# Patient Record
Sex: Female | Born: 1965
Health system: Southern US, Community
[De-identification: ages and names within clinical notes are randomized; demographics above are authoritative.]

## PROBLEM LIST (undated history)

## (undated) DIAGNOSIS — M79609 Pain in unspecified limb: Secondary | ICD-10-CM

## (undated) DIAGNOSIS — R7302 Impaired glucose tolerance (oral): Secondary | ICD-10-CM

## (undated) DIAGNOSIS — M1990 Unspecified osteoarthritis, unspecified site: Secondary | ICD-10-CM

## (undated) DIAGNOSIS — R002 Palpitations: Secondary | ICD-10-CM

## (undated) DIAGNOSIS — M519 Unspecified thoracic, thoracolumbar and lumbosacral intervertebral disc disorder: Secondary | ICD-10-CM

## (undated) DIAGNOSIS — M549 Dorsalgia, unspecified: Secondary | ICD-10-CM

## (undated) DIAGNOSIS — M545 Low back pain, unspecified: Secondary | ICD-10-CM

## (undated) DIAGNOSIS — R42 Dizziness and giddiness: Secondary | ICD-10-CM

## (undated) DIAGNOSIS — M7989 Other specified soft tissue disorders: Secondary | ICD-10-CM

## (undated) DIAGNOSIS — E739 Lactose intolerance, unspecified: Secondary | ICD-10-CM

## (undated) DIAGNOSIS — R7303 Prediabetes: Secondary | ICD-10-CM

## (undated) DIAGNOSIS — K760 Fatty (change of) liver, not elsewhere classified: Secondary | ICD-10-CM

## (undated) DIAGNOSIS — E785 Hyperlipidemia, unspecified: Secondary | ICD-10-CM

## (undated) DIAGNOSIS — M109 Gout, unspecified: Secondary | ICD-10-CM

## (undated) DIAGNOSIS — E7800 Pure hypercholesterolemia, unspecified: Secondary | ICD-10-CM

## (undated) DIAGNOSIS — T7840XA Allergy, unspecified, initial encounter: Secondary | ICD-10-CM

## (undated) DIAGNOSIS — E559 Vitamin D deficiency, unspecified: Secondary | ICD-10-CM

## (undated) DIAGNOSIS — R51 Headache: Secondary | ICD-10-CM

## (undated) DIAGNOSIS — J309 Allergic rhinitis, unspecified: Secondary | ICD-10-CM

## (undated) HISTORY — DX: Palpitations: R00.2

## (undated) HISTORY — DX: Vitamin D deficiency, unspecified: E55.9

## (undated) HISTORY — DX: Low back pain, unspecified: M54.50

## (undated) HISTORY — DX: Allergic rhinitis, unspecified: J30.9

## (undated) HISTORY — DX: Morbid (severe) obesity due to excess calories: E66.01

## (undated) HISTORY — DX: Impaired glucose tolerance (oral): R73.02

## (undated) HISTORY — DX: Other specified soft tissue disorders: M79.89

## (undated) HISTORY — DX: Dorsalgia, unspecified: M54.9

## (undated) HISTORY — DX: Hyperlipidemia, unspecified: E78.5

## (undated) HISTORY — DX: Pure hypercholesterolemia, unspecified: E78.00

## (undated) HISTORY — DX: Gout, unspecified: M10.9

## (undated) HISTORY — PX: OTHER SURGICAL HISTORY: SHX169

## (undated) HISTORY — DX: Unspecified osteoarthritis, unspecified site: M19.90

## (undated) HISTORY — DX: Pain in unspecified limb: M79.609

## (undated) HISTORY — PX: TUBAL LIGATION: SHX77

## (undated) HISTORY — PX: ABDOMINAL HYSTERECTOMY: SHX81

## (undated) HISTORY — DX: Dizziness and giddiness: R42

## (undated) HISTORY — DX: Fatty (change of) liver, not elsewhere classified: K76.0

## (undated) HISTORY — DX: Unspecified thoracic, thoracolumbar and lumbosacral intervertebral disc disorder: M51.9

## (undated) HISTORY — DX: Prediabetes: R73.03

## (undated) HISTORY — DX: Lactose intolerance, unspecified: E73.9

## (undated) HISTORY — DX: Allergy, unspecified, initial encounter: T78.40XA

## (undated) HISTORY — DX: Low back pain: M54.5

## (undated) HISTORY — DX: Headache: R51

---

## 1998-08-25 ENCOUNTER — Emergency Department (HOSPITAL_COMMUNITY): Admission: EM | Admit: 1998-08-25 | Discharge: 1998-08-25 | Payer: Self-pay | Admitting: Emergency Medicine

## 1999-02-11 ENCOUNTER — Other Ambulatory Visit: Admission: RE | Admit: 1999-02-11 | Discharge: 1999-02-11 | Payer: Self-pay | Admitting: Obstetrics

## 1999-11-26 ENCOUNTER — Emergency Department (HOSPITAL_COMMUNITY): Admission: EM | Admit: 1999-11-26 | Discharge: 1999-11-26 | Payer: Self-pay | Admitting: Emergency Medicine

## 2000-01-27 ENCOUNTER — Other Ambulatory Visit: Admission: RE | Admit: 2000-01-27 | Discharge: 2000-01-27 | Payer: Self-pay | Admitting: Obstetrics

## 2000-06-28 ENCOUNTER — Emergency Department (HOSPITAL_COMMUNITY): Admission: EM | Admit: 2000-06-28 | Discharge: 2000-06-28 | Payer: Self-pay | Admitting: Emergency Medicine

## 2001-04-26 ENCOUNTER — Ambulatory Visit (HOSPITAL_COMMUNITY): Admission: RE | Admit: 2001-04-26 | Discharge: 2001-04-26 | Payer: Self-pay

## 2001-10-04 ENCOUNTER — Other Ambulatory Visit: Admission: RE | Admit: 2001-10-04 | Discharge: 2001-10-04 | Payer: Self-pay | Admitting: Obstetrics and Gynecology

## 2002-01-14 ENCOUNTER — Emergency Department (HOSPITAL_COMMUNITY): Admission: EM | Admit: 2002-01-14 | Discharge: 2002-01-14 | Payer: Self-pay | Admitting: Emergency Medicine

## 2004-08-13 ENCOUNTER — Ambulatory Visit (HOSPITAL_COMMUNITY): Admission: RE | Admit: 2004-08-13 | Discharge: 2004-08-13 | Payer: Self-pay | Admitting: Obstetrics and Gynecology

## 2004-09-01 ENCOUNTER — Encounter: Admission: RE | Admit: 2004-09-01 | Discharge: 2004-09-01 | Payer: Self-pay | Admitting: Obstetrics and Gynecology

## 2005-02-10 ENCOUNTER — Other Ambulatory Visit: Admission: RE | Admit: 2005-02-10 | Discharge: 2005-02-10 | Payer: Self-pay | Admitting: Obstetrics and Gynecology

## 2007-03-01 ENCOUNTER — Ambulatory Visit (HOSPITAL_COMMUNITY): Admission: RE | Admit: 2007-03-01 | Discharge: 2007-03-02 | Payer: Self-pay | Admitting: Obstetrics and Gynecology

## 2007-03-01 ENCOUNTER — Encounter (INDEPENDENT_AMBULATORY_CARE_PROVIDER_SITE_OTHER): Payer: Self-pay | Admitting: Obstetrics and Gynecology

## 2007-08-15 LAB — HM MAMMOGRAPHY: HM Mammogram: NORMAL

## 2008-04-02 ENCOUNTER — Encounter: Admission: RE | Admit: 2008-04-02 | Discharge: 2008-04-02 | Payer: Self-pay | Admitting: Obstetrics and Gynecology

## 2008-04-15 LAB — CONVERTED CEMR LAB: Pap Smear: NORMAL

## 2008-07-10 ENCOUNTER — Encounter: Admission: RE | Admit: 2008-07-10 | Discharge: 2008-07-10 | Payer: Self-pay | Admitting: Emergency Medicine

## 2008-11-25 ENCOUNTER — Ambulatory Visit: Payer: Self-pay | Admitting: Internal Medicine

## 2008-11-25 DIAGNOSIS — E782 Mixed hyperlipidemia: Secondary | ICD-10-CM | POA: Insufficient documentation

## 2008-11-25 DIAGNOSIS — R42 Dizziness and giddiness: Secondary | ICD-10-CM

## 2008-11-25 DIAGNOSIS — E739 Lactose intolerance, unspecified: Secondary | ICD-10-CM

## 2008-11-25 DIAGNOSIS — M109 Gout, unspecified: Secondary | ICD-10-CM

## 2008-11-25 DIAGNOSIS — J309 Allergic rhinitis, unspecified: Secondary | ICD-10-CM

## 2008-11-25 DIAGNOSIS — E785 Hyperlipidemia, unspecified: Secondary | ICD-10-CM

## 2008-11-25 DIAGNOSIS — E669 Obesity, unspecified: Secondary | ICD-10-CM | POA: Insufficient documentation

## 2008-11-25 HISTORY — DX: Hyperlipidemia, unspecified: E78.5

## 2008-11-25 HISTORY — DX: Gout, unspecified: M10.9

## 2008-11-25 HISTORY — DX: Morbid (severe) obesity due to excess calories: E66.01

## 2008-11-25 HISTORY — DX: Allergic rhinitis, unspecified: J30.9

## 2008-11-25 HISTORY — DX: Dizziness and giddiness: R42

## 2008-11-25 HISTORY — DX: Lactose intolerance, unspecified: E73.9

## 2008-11-25 LAB — CONVERTED CEMR LAB
Cholesterol: 193 mg/dL (ref 0–200)
GFR calc non Af Amer: 73.49 mL/min (ref >60)
HDL: 41.9 mg/dL (ref >39.00)
Potassium: 4.4 meq/L (ref 3.5–5.1)
Sodium: 140 meq/L (ref 135–145)
VLDL: 35.2 mg/dL (ref 0.0–40.0)

## 2009-04-30 ENCOUNTER — Ambulatory Visit (HOSPITAL_COMMUNITY): Admission: RE | Admit: 2009-04-30 | Discharge: 2009-04-30 | Payer: Self-pay | Admitting: Obstetrics and Gynecology

## 2009-06-19 ENCOUNTER — Ambulatory Visit: Payer: Self-pay | Admitting: Internal Medicine

## 2009-06-19 LAB — CONVERTED CEMR LAB
AST: 21 units/L (ref 0–37)
Alkaline Phosphatase: 57 units/L (ref 39–117)
BUN: 10 mg/dL (ref 6–23)
Bilirubin Urine: NEGATIVE
Eosinophils Absolute: 0.1 10*3/uL (ref 0.0–0.7)
GFR calc non Af Amer: 73.28 mL/min (ref >60)
HDL: 46.7 mg/dL (ref >39.00)
Hgb A1c MFr Bld: 5.6 % (ref 4.6–6.5)
MCHC: 33.6 g/dL (ref 30.0–36.0)
MCV: 93.1 fL (ref 78.0–100.0)
Monocytes Absolute: 0.5 10*3/uL (ref 0.1–1.0)
Neutrophils Relative %: 45.3 % (ref 43.0–77.0)
Nitrite: NEGATIVE
Platelets: 235 10*3/uL (ref 150.0–400.0)
Potassium: 4.3 meq/L (ref 3.5–5.1)
Sodium: 143 meq/L (ref 135–145)
Specific Gravity, Urine: 1.025 (ref 1.000–1.030)
TSH: 2.9 microintl units/mL (ref 0.35–5.50)
Total Bilirubin: 0.6 mg/dL (ref 0.3–1.2)
Total Protein, Urine: NEGATIVE mg/dL
Triglycerides: 85 mg/dL (ref 0.0–149.0)
VLDL: 17 mg/dL (ref 0.0–40.0)
pH: 5.5 (ref 5.0–8.0)

## 2009-06-25 ENCOUNTER — Ambulatory Visit: Payer: Self-pay | Admitting: Internal Medicine

## 2009-07-10 ENCOUNTER — Encounter: Payer: Self-pay | Admitting: Internal Medicine

## 2009-12-17 ENCOUNTER — Ambulatory Visit: Payer: Self-pay | Admitting: Internal Medicine

## 2009-12-17 DIAGNOSIS — M549 Dorsalgia, unspecified: Secondary | ICD-10-CM

## 2009-12-17 HISTORY — DX: Dorsalgia, unspecified: M54.9

## 2009-12-19 ENCOUNTER — Ambulatory Visit: Payer: Self-pay | Admitting: Family Medicine

## 2009-12-19 DIAGNOSIS — M79609 Pain in unspecified limb: Secondary | ICD-10-CM | POA: Insufficient documentation

## 2009-12-19 HISTORY — DX: Pain in unspecified limb: M79.609

## 2010-01-06 ENCOUNTER — Telehealth: Payer: Self-pay | Admitting: Internal Medicine

## 2010-03-19 ENCOUNTER — Encounter: Admission: RE | Admit: 2010-03-19 | Discharge: 2010-03-19 | Payer: Self-pay | Admitting: Sports Medicine

## 2010-05-07 ENCOUNTER — Ambulatory Visit (HOSPITAL_COMMUNITY)
Admission: RE | Admit: 2010-05-07 | Discharge: 2010-05-07 | Payer: Self-pay | Source: Home / Self Care | Attending: Obstetrics and Gynecology | Admitting: Obstetrics and Gynecology

## 2010-06-06 ENCOUNTER — Encounter: Payer: Self-pay | Admitting: Sports Medicine

## 2010-06-06 ENCOUNTER — Encounter: Payer: Self-pay | Admitting: Obstetrics and Gynecology

## 2010-06-15 NOTE — Progress Notes (Signed)
Summary: Referral  Phone Note Call from Patient Call back at Work Phone [phone]   Caller: Patient Summary of Call: Pt called stating that back pain has not improved with medications given at last OV. Pt is requesting referral to Ortho as discussed at OV. Initial call taken by: Margaret Pyle, CMA,  January 06, 2010 9:36 AM  Follow-up for Phone Call        done per emr Follow-up by: Corwin Levins MD,  January 06, 2010 1:03 PM  Additional Follow-up for Phone Call Additional follow up Details #1::        pt informed, will expect call from [facility] with appt info Additional Follow-up by: Margaret Pyle, CMA,  January 06, 2010 1:08 PM

## 2010-06-15 NOTE — Assessment & Plan Note (Signed)
Summary: CPX / $50 /NWS   Vital Signs:  Patient profile:   45 year old female Height:      64.5 inches Weight:      236.38 pounds BMI:     40.09 O2 Sat:      98 % on Room air Temp:     97.5 degrees F oral Pulse rate:   77 / minute BP sitting:   112 / 72  (left arm) Cuff size:   large  Vitals Entered ByZella Ball Ewing (June 25, 2009 8:41 AM)  O2 Flow:  Room air CC: Adult physical/RE   CC:  Adult physical/RE.  History of Present Illness: gained 4 lbs since lsat visit, trying to walk more;  Pt denies CP, sob, doe, wheezing, orthopnea, pnd, worsening LE edema, palps, dizziness or syncope   Pt denies new neuro symptoms such as headache, facial or extremity weakness .  Has some tinnitus and dizziness and left ear fullness in last few days, wtihout fever, headache, ST., or cough.   Problems Prior to Update: 1)  Dizziness  (ICD-780.4) 2)  Preventive Health Care  (ICD-V70.0) 3)  Morbid Obesity  (ICD-278.01) 4)  Dizziness  (ICD-780.4) 5)  Gout  (ICD-274.9) 6)  Glucose Intolerance  (ICD-271.3) 7)  Hyperlipidemia  (ICD-272.4) 8)  Allergic Rhinitis  (ICD-477.9)  Medications Prior to Update: 1)  Tramadol Hcl 50 Mg Tabs (Tramadol Hcl) .Marland Kitchen.. 1-2 By Mouth Every 6 Hours As Needed 2)  Naproxen Sodium 550 Mg Tabs (Naproxen Sodium) .Marland Kitchen.. 1 By Mouth As Needed 3)  Phentermine Hcl 37.5 Mg Tabs (Phentermine Hcl) .Marland Kitchen.. 1 By Mouth Once Daily 4)  Cetirizine Hcl 10 Mg Tabs (Cetirizine Hcl) .Marland Kitchen.. 1 By Mouth Once Daily As Needed Allergy  Current Medications (verified): 1)  Tramadol Hcl 50 Mg Tabs (Tramadol Hcl) .Marland Kitchen.. 1-2 By Mouth Every 6 Hours As Needed 2)  Naproxen Sodium 550 Mg Tabs (Naproxen Sodium) .Marland Kitchen.. 1 By Mouth As Needed 3)  Phentermine Hcl 37.5 Mg Tabs (Phentermine Hcl) .Marland Kitchen.. 1 By Mouth Once Daily 4)  Cetirizine Hcl 10 Mg Tabs (Cetirizine Hcl) .Marland Kitchen.. 1 By Mouth Once Daily As Needed Allergy  Allergies (verified): No Known Drug Allergies  Past History:  Past Medical History: Last updated:  11/25/2008 Allergic rhinitis Hyperlipidemia glucose intolerance lumbar disc disease/LLE pain , s/p cortisone shots - [place] [facility] Gout  Past Surgical History: Last updated: 11/25/2008 Tubal ligation s/p pilonidal cyst Hysterectomy 2006  - non malignant  Family History: Last updated: 11/25/2008 father with glaucoma, HTN, DM mother with elevated cholesterol, HTN grandmorther with DM grandfather with DM, CAD/MI 2 brothers - healthy  Social History: Last updated: 11/25/2008 Married 3 children 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         all otherwise negative per pt - except keloid o the mid upper sternal area - ? worse, more irritated , has seen derm and she wont do the steroid shots  Physical Exam  General:  alert and overweight-appearing.   Head:  normocephalic and atraumatic.   Eyes:  vision grossly intact, pupils equal, and pupils  round.   Ears:  R ear normal and L TM with mild erythema Nose:  no external deformity and no nasal discharge.   Mouth:  no gingival abnormalities and pharynx pink and moist.   Neck:  supple and no masses.   Lungs:  normal respiratory effort and normal breath sounds.   Heart:  normal rate and regular rhythm.   Abdomen:  soft, non-tender, and normal bowel sounds.   Msk:  no joint tenderness and no joint swelling.   Extremities:  no edema, no erythema  Neurologic:   cranial nerves II-XII intact and strength normal in all extremities.     Impression & Recommendations:  Problem # 1:  Preventive Health Care (ICD-V70.0)  Overall doing well, age appropriate education and counseling updated and referral for appropriate preventive services done unless declined, immunizations up to date or declined, diet counseling done if overweight, urged to quit smoking if smokes , most recent labs reviewed and current ordered if appropriate, ecg reviewed or declined (interpretation per ECG scanned in the EMR if done); information regarding Medicare Prevention requirements given if appropriate   Orders: EKG w/ Interpretation (93000)  Problem # 2:  DIZZINESS (ICD-780.4)  Her updated medication list for this problem includes:    Cetirizine Hcl 10 Mg Tabs (Cetirizine hcl) .Marland Kitchen... 1 by mouth once daily as needed allergy and tinnitis liekly due to left middle ear inflammation - treat as above, f/u any worsening signs or symptoms , also for mucinex otc as needed   Complete Medication List: 1)  Tramadol Hcl 50 Mg Tabs (Tramadol hcl) .Marland Kitchen.. 1-2 by mouth every 6 hours as needed 2)  Naproxen Sodium 550 Mg Tabs (Naproxen sodium) .Marland Kitchen.. 1 by mouth as needed 3)  Phentermine Hcl 37.5 Mg Tabs (Phentermine hcl) .Marland Kitchen.. 1 by mouth once daily 4)  Cetirizine Hcl 10 Mg Tabs (Cetirizine hcl) .Marland Kitchen.. 1 by mouth once daily as needed allergy  Patient Instructions: 1)  Please take all new medications as prescribed 2)  Continue all previous medications as before this visit  3)  Please schedule a follow-up appointment in 1 year or sooner if needed Prescriptions: PHENTERMINE HCL 37.5 MG TABS (PHENTERMINE HCL) 1 by mouth once daily  #30 x 2   Entered and Authorized by:   Corwin Levins MD   Signed by:   Corwin Levins MD on 06/25/2009   Method used:   Print then Give to Patient   RxID:   [id number]

## 2010-06-15 NOTE — Assessment & Plan Note (Signed)
Summary: LEFT FOOT PAIN/DLO   Vital Signs:  Patient profile:   45 year old female Height:      64.50 inches Weight:      224 pounds BMI:     37.99 O2 Sat:      98 % on Room air Temp:     98.0 degrees F oral Pulse rate:   80 / minute BP sitting:   114 / 60  (left arm)  Vitals Entered By: Doristine Devoid CMA (December 19, 2009 10:11 AM)  O2 Flow:  Room air CC: L foot pain xlast night hurts to walk on    History of Present Illness: HAs two charts.Marland Kitchen2 MR numbers. Other chart reviewed.   Has been seen  recently for lumbar disc herniation.. Dr. Jonny Ruiz..seen 2 days ago Had been having 2 weeks of left lower back pain..shooting down leg MRI last year...showed protruding disc Dx with acute left sciatica. Given oxycodone, prednisone and cyclobenzaprine..  Last night...new symptom. Sudden onset left medial foot pain. No known injury. She was alking at [employer] earlier in day but was fine.  Swelling in medial foot.  Prednisone has helped back.  No redness, no sensitivity to touch...does not feel like gout.    Review of Systems General:  Denies fatigue. CV:  Denies chest pain or discomfort. Resp:  Denies shortness of breath.  Physical Exam  General:  overweight appearing female in NAD Mouth:  MMM Msk:  left lateral ankle no tenderness to palpation, ttp over medial ankle and mid arch. no redness, minmal swelling. Limitied  ROM of ankleanf foot flex/ext due to pain.   Wearing flip flops.  Pulses:  R and L posterior tibial pulses are full and equal bilaterally  Extremities:  no edema  Neg Homan's sign...no sign of DVT.   Impression & Recommendations:  Problem # 1:  FOOT PAIN, LEFT (ICD-729.5) No clear evidence of gout or association of pain with radiculopathy. of note pt's sciatica is improving on prednisone, pain medicaitons and muscle relaxants. No sign of DVT. Musle strain likely due to walking irregularly due to sciatica. Wear more supportive shoes with arch, lace up ankle  brace to limit pulling on medial foot, ice, elevation. Follow up if not improving.  Complete Medication List: 1)  Ace Ankle Brace Left/right Misc (Elastic bandages & supports) .... Left lace up ankle brace  wear daily x 1-2 weeks.  Patient Instructions: 1)  Ice left foot/ankle. Elevate left leg. 2)   Wear lace up ankle brace. 3)   Daily range of motion exercises. 4)   Continue current back treatment. 5)  Follow up if not improving as expected.  Prescriptions: ACE ANKLE BRACE LEFT/RIGHT  MISC (ELASTIC BANDAGES & SUPPORTS) Left lace up ankle brace  Wear daily x 1-2 weeks.  #1 x 0   Entered and Authorized by:   Kerby Nora MD   Signed by:   Kerby Nora MD on 12/19/2009   Method used:   Print then Give to Patient   RxID:   [id number]

## 2010-06-15 NOTE — Assessment & Plan Note (Signed)
Summary: pain down back of legs/cd   Vital Signs:  Patient profile:   45 year old female Height:      64.5 inches Weight:      224.50 pounds BMI:     38.08 O2 Sat:      97 % on Room air Temp:     98.4 degrees F oral Pulse rate:   69 / minute BP sitting:   100 / 68  (left arm) Cuff size:   large  Vitals Entered By: Zella Ball Ewing CMA (AAMA) (December 17, 2009 1:35 PM)  O2 Flow:  Room air CC: Low back and leg pain/RE   CC:  Low back and leg pain/RE.  History of Present Illness: here with acute onset back pain for 2 wks, seemed to start after the zoomba ccalss; located lower back, rated severe 10/10 per pt;  better to lie down, but wose to sit up straight , standing up, and walkiing; does seem to radiate post buttocks and thighs to the knees; tingles in same area,  no LE weakness, no falls on injuyr; no bowel or bladder changes;  No fever, wt loss, loss of appetite or other constitutional symptoms but does have night sweats and hot flashes.  Has had 2 MRI in the past with lumbar disc herniations at 2 levels;  no surgury needed,  last MRI last year some time at [facility]; has seen orhtopedic she thinks at [facility] [place] [address] - s/p 2 cortisone shots to the back with the last one aprox 2008.   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    Problems Prior to Update: 1)  Dizziness  (ICD-780.4) 2)  Preventive Health Care  (ICD-V70.0) 3)  Morbid Obesity  (ICD-278.01) 4)  Dizziness  (ICD-780.4) 5)  Gout  (ICD-274.9) 6)  Glucose Intolerance  (ICD-271.3) 7)  Hyperlipidemia  (ICD-272.4) 8)  Allergic Rhinitis  (ICD-477.9)  Medications Prior to Update: 1)  Tramadol Hcl 50 Mg Tabs (Tramadol Hcl) .Marland Kitchen.. 1-2 By Mouth Every 6 Hours As Needed 2)  Naproxen Sodium 550 Mg Tabs (Naproxen Sodium) .Marland Kitchen.. 1 By Mouth As Needed 3)  Phentermine Hcl 37.5 Mg Tabs (Phentermine Hcl) .Marland Kitchen.. 1 By Mouth Once Daily 4)  Cetirizine Hcl 10 Mg Tabs (Cetirizine Hcl) .Marland Kitchen.. 1 By Mouth Once  Daily As Needed Allergy  Current Medications (verified): 1)  Tramadol Hcl 50 Mg Tabs (Tramadol Hcl) .Marland Kitchen.. 1-2 By Mouth Every 6 Hours As Needed 2)  Naproxen Sodium 550 Mg Tabs (Naproxen Sodium) .Marland Kitchen.. 1 By Mouth As Needed 3)  Phentermine Hcl 37.5 Mg Tabs (Phentermine Hcl) .Marland Kitchen.. 1 By Mouth Once Daily 4)  Cetirizine Hcl 10 Mg Tabs (Cetirizine Hcl) .Marland Kitchen.. 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   Head:  normocephalic and atraumatic.   Eyes:  vision grossly intact, pupils equal, and  pupils round.   Ears:  R ear normal and L ear normal.   Nose:  no external deformity and no nasal discharge.   Mouth:  no gingival abnormalities and pharynx pink and moist.   Neck:  supple and no masses.   Lungs:  normal respiratory effort and normal breath sounds.   Heart:  normal rate and regular rhythm.   Abdomen:  soft, non-tender, and normal bowel sounds.   Msk:  no spine tender, and no paravertebral swelling or rash but has marked tender to immed right lowest lumbar paravertebral area and right buttock area, no other more distal tender or swelling or rash to the legs Extremities:  no edema, no erythema  Neurologic:  cranial nerves II-XII intact, strength normal in all extremities, sensation intact to light touch, gait normal, and DTRs  symmetrical and normal.     Impression & Recommendations:  Problem # 1:  BACK PAIN (ICD-724.5)  Her updated medication list for this problem includes:    Tramadol Hcl 50 Mg Tabs (Tramadol hcl) .Marland Kitchen... 1-2 by mouth every 6 hours as needed    Naproxen Sodium 550 Mg Tabs (Naproxen sodium) .Marland Kitchen... 1 by mouth as needed    Oxycodone Hcl 5 Mg Tabs (Oxycodone hcl) .Marland Kitchen... 1-2 by mouth q 6 hrs as needed    Flexeril 5 Mg Tabs (Cyclobenzaprine hcl) .Marland Kitchen... 1po three times a day as needed exam benign exept for that documented, most likely an exac of her known lumbar DJD or DDD;  ok for pain med, pred pack trial, and trial of muscle relaxer,  do not feel films needed at this time;  pt to call if not improved in 3 to 5 days for ortho referral  and /or MRI  Complete Medication List: 1)  Tramadol Hcl 50 Mg Tabs (Tramadol hcl) .Marland Kitchen.. 1-2 by mouth every 6 hours as needed 2)  Naproxen Sodium 550 Mg Tabs (Naproxen sodium) .Marland Kitchen.. 1 by mouth as needed 3)  Cetirizine Hcl 10 Mg Tabs (Cetirizine hcl) .Marland Kitchen.. 1 by mouth once daily as needed allergy 4)  Oxycodone Hcl 5 Mg Tabs (Oxycodone hcl) .Marland Kitchen.. 1-2 by mouth q 6 hrs as needed 5)  Prednisone 10 Mg Tabs (Prednisone) .... 4po qd for 3days, then 3po qd for 3days, then 2po qd for 3days, then 1po qd for 3 days, then stop 6)  Flexeril 5 Mg Tabs (Cyclobenzaprine hcl) .Marland Kitchen.. 1po three times a day as needed  Patient Instructions: 1)  Please take all new medications as prescribed 2)  Continue all previous medications as before this visit  3)  Please schedule a follow-up appointment in 5 months with CPX labs Prescriptions: FLEXERIL 5 MG TABS (CYCLOBENZAPRINE HCL) 1po three times a day as needed  #60 x 1   Entered and Authorized by:   Corwin Levins MD   Signed by:   Corwin Levins MD on 12/17/2009   Method used:   Print then Give to Patient   RxID:   [id number] PREDNISONE 10 MG TABS (PREDNISONE) 4po qd for 3days, then 3po qd for 3days, then 2po qd for 3days, then 1po qd for 3 days,  then stop  #30 x 0   Entered and Authorized by:   Corwin Levins MD   Signed by:   Corwin Levins MD on 12/17/2009   Method used:   Print then Give to Patient   RxID:   [id number] OXYCODONE HCL 5 MG TABS (OXYCODONE HCL) 1-2 by mouth q 6 hrs as needed  #  60 x 0   Entered and Authorized by:   Corwin Levins MD   Signed by:   Corwin Levins MD on 12/17/2009   Method used:   Print then Give to Patient   RxID:   [id number]

## 2010-06-22 ENCOUNTER — Encounter: Payer: Self-pay | Admitting: Internal Medicine

## 2010-06-22 ENCOUNTER — Ambulatory Visit (INDEPENDENT_AMBULATORY_CARE_PROVIDER_SITE_OTHER): Payer: 59 | Admitting: Internal Medicine

## 2010-06-22 DIAGNOSIS — R519 Headache, unspecified: Secondary | ICD-10-CM | POA: Insufficient documentation

## 2010-06-22 DIAGNOSIS — J309 Allergic rhinitis, unspecified: Secondary | ICD-10-CM

## 2010-06-22 DIAGNOSIS — R51 Headache: Secondary | ICD-10-CM

## 2010-06-22 HISTORY — DX: Headache: R51

## 2010-07-01 NOTE — Assessment & Plan Note (Signed)
Summary: SINUS /NWS   Vital Signs:  Patient profile:   45 year old female Height:      64.5 inches (163.83 cm) O2 Sat:      98 % on Room air Temp:     98.9 degrees F (37.17 degrees C) oral Pulse rate:   79 / minute BP sitting:   102 / 78  (left arm) Cuff size:   large  Vitals Entered By: Orlan Leavens RMA (June 22, 2010 3:50 PM)  O2 Flow:  Room air CC: Sinus, URI symptoms Is Patient Diabetic? No Pain Assessment Patient in pain? no        Primary Care Provider:  Corwin Levins MD  CC:  Sinus and URI symptoms.  History of Present Illness:  URI Symptoms      This is a 45 year old woman who presents with URI symptoms.  The symptoms began 5 days ago.  The severity is described as mild.  symptoms precipitated by exposure to mold (one of her "worse" allergens).  The patient reports nasal congestion, clear nasal discharge, sore throat, dry cough, and earache, but denies purulent nasal discharge, productive cough, and sick contacts.  The patient denies fever, stiff neck, dyspnea, wheezing, rash, vomiting, diarrhea, and use of an antipyretic.  The patient also reports itchy watery eyes, itchy throat, sneezing, seasonal symptoms, and headache.  The patient denies muscle aches and severe fatigue.  The patient denies the following risk factors for Strep sinusitis: double sickening, tooth pain, Strep exposure, and tender adenopathy.    Clinical Review Panels:  Prevention   Last Mammogram:  normal (08/15/2007)   Last Pap Smear:  normal (04/15/2008)  Immunizations   Last Tetanus Booster:  Tdap (11/25/2008)  Lipid Management   Cholesterol:  200 (06/19/2009)   LDL (bad choesterol):  136 (06/19/2009)   HDL (good cholesterol):  46.70 (06/19/2009)  CBC   WBC:  5.9 (06/19/2009)   RBC:  4.18 (06/19/2009)   Hgb:  13.1 (06/19/2009)   Hct:  38.9 (06/19/2009)   Platelets:  235.0 (06/19/2009)   MCV  93.1 (06/19/2009)   MCHC  33.6 (06/19/2009)   RDW  12.2 (06/19/2009)   PMN:  45.3  (06/19/2009)   Lymphs:  44.7 (06/19/2009)   Monos:  8.0 (06/19/2009)   Eosinophils:  2.0 (06/19/2009)   Basophil:  0.0 (06/19/2009)  Complete Metabolic Panel   Glucose:  86 (06/19/2009)   Sodium:  143 (06/19/2009)   Potassium:  4.3 (06/19/2009)   Chloride:  106 (06/19/2009)   CO2:  29 (06/19/2009)   BUN:  10 (06/19/2009)   Creatinine:  0.9 (06/19/2009)   Albumin:  4.5 (06/19/2009)   Total Protein:  8.0 (06/19/2009)   Calcium:  9.6 (06/19/2009)   Total Bili:  0.6 (06/19/2009)   Alk Phos:  57 (06/19/2009)   SGPT (ALT):  24 (06/19/2009)   SGOT (AST):  21 (06/19/2009)   Current Medications (verified): 1)  Tramadol Hcl 50 Mg Tabs (Tramadol Hcl) .Marland Kitchen.. 1-2 By Mouth Every 6 Hours As Needed 2)  Naproxen Sodium 550 Mg Tabs (Naproxen Sodium) .Marland Kitchen.. 1 By Mouth As Needed 3)  Cetirizine Hcl 10 Mg Tabs (Cetirizine Hcl) .Marland Kitchen.. 1 By Mouth Once Daily As Needed Allergy 4)  Flexeril 5 Mg Tabs (Cyclobenzaprine Hcl) .Marland Kitchen.. 1po Three Times A Day As Needed  Allergies (verified): No Known Drug Allergies  Past History:  Past Medical History: Allergic rhinitis Hyperlipidemia glucose intolerance lumbar disc disease/LLE pain , s/p cortisone shots - [place] [facility] Gout  Review of Systems  The patient denies anorexia, fever, weight loss, decreased hearing, hoarseness, chest pain, dyspnea on exertion, hemoptysis, and abdominal pain.    Physical Exam  General:  overweight appearing female in NAD - nontoxic Head:  Normocephalic and atraumatic without obvious abnormalities. No apparent alopecia or balding. Eyes:  vision grossly intact; pupils equal, round and reactive to light.  conjunctiva and lids normal.    Ears:  R ear normal and L ear normal.  mild clear effusion B Mouth:  teeth and gums in good repair; mucous membranes moist, without lesions or ulcers. oropharynx clear without exudate, mild erythema. clear PND Neck:  supple, full ROM, no masses, no thyromegaly; no thyroid nodules or tenderness. no JVD or  carotid bruits.   Lungs:  normal respiratory effort, no intercostal retractions or use of accessory muscles; normal breath sounds bilaterally - no crackles and no wheezes.    Heart:  normal rate, regular rhythm, no murmur, and no rub. BLE without edema. Neurologic:  alert & oriented X3 and cranial nerves II-XII symetrically intact.  strength normal in all extremities, sensation intact to light touch, and gait normal. speech fluent without dysarthria or aphasia; follows commands with good comprehension.    Impression & Recommendations:  Problem # 1:  ALLERGIC RHINITIS (ICD-477.9)  add nasal steroid and resume daily antihist -   Her updated medication list for this problem includes:    Cetirizine Hcl 10 Mg Tabs (Cetirizine hcl) .Marland Kitchen... 1 by mouth once daily as needed allergy    Nasonex 50 Mcg/act Susp (Mometasone furoate) .Marland Kitchen... 2 sprays each nostril  every morning  Orders: Prescription Created Electronically [phone])  Problem # 2:  HEADACHE (ICD-784.0)  suspect sinus pressure related - see above also use NSAID two times a day next 5 days, then as needed  The following medications were removed from the medication list:    Oxycodone Hcl 5 Mg Tabs (Oxycodone hcl) .Marland Kitchen... 1-2 by mouth q 6 hrs as needed Her updated medication list for this problem includes:    Tramadol Hcl 50 Mg Tabs (Tramadol hcl) .Marland Kitchen... 1-2 by mouth every 6 hours as needed    Naprosyn 500 Mg Tabs (Naproxen) .Marland Kitchen... 1 by mouth two times a day x 5 days, then as needed for pain symptoms  Orders: Prescription Created Electronically [phone])  Complete Medication List: 1)  Tramadol Hcl 50 Mg Tabs (Tramadol hcl) .Marland Kitchen.. 1-2 by mouth every 6 hours as needed 2)  Naprosyn 500 Mg Tabs (Naproxen) .Marland Kitchen.. 1 by mouth two times a day x 5 days, then as needed for pain symptoms 3)  Cetirizine Hcl 10 Mg Tabs (Cetirizine hcl) .Marland Kitchen.. 1 by mouth once daily as needed allergy 4)  Flexeril 5 Mg Tabs (Cyclobenzaprine hcl) .Marland Kitchen.. 1po three times a day as  needed 5)  Nasonex 50 Mcg/act Susp (Mometasone furoate) .... 2 sprays each nostril  every morning  Patient Instructions: 1)  it was good to see you today. 2)  naprosyn pain medication for head symptoms and use nasal spray (nasonex) and zyrtec for allergy and sinus symptoms - your prescriptions have been electronically submitted to your pharmacy. Please take as directed. Contact our office if you believe you're having problems with the medication(s).  3)  Get plenty of rest, drink lots of clear liquids, and use Tylenol for comfort. Return in 7-10 days if you're not better:sooner if you're feeling worse. Prescriptions: NAPROSYN 500 MG TABS (NAPROXEN) 1 by mouth two times a day x 5 days, then as needed  for pain symptoms  #30 x 0   Entered and Authorized by:   Newt Lukes MD   Signed by:   Newt Lukes MD on 06/22/2010   Method used:   Electronically to        [facility] (retail)       [address]       [place], [place]  [address]       Ph: [phone]       Fax: [phone]   RxID:   [id number] NASONEX 50 MCG/ACT SUSP (MOMETASONE FUROATE) 2 sprays each nostril  every morning  #1 x 3   Entered and Authorized by:   Newt Lukes MD   Signed by:   Newt Lukes MD on 06/22/2010   Method used:   Electronically to        [facility] (retail)       [address]       [place], [place]  [address]       Ph: [phone]       Fax: [phone]   RxID:   [id number]    Orders Added: 1)  Est. Patient Level IV [30160] 2)  Prescription Created Electronically [phone]

## 2010-07-26 ENCOUNTER — Other Ambulatory Visit: Payer: Self-pay | Admitting: Sports Medicine

## 2010-07-26 DIAGNOSIS — M549 Dorsalgia, unspecified: Secondary | ICD-10-CM

## 2010-07-30 ENCOUNTER — Other Ambulatory Visit: Payer: 59

## 2010-08-24 ENCOUNTER — Encounter: Payer: Self-pay | Admitting: Internal Medicine

## 2010-08-24 ENCOUNTER — Ambulatory Visit (INDEPENDENT_AMBULATORY_CARE_PROVIDER_SITE_OTHER): Payer: 59 | Admitting: Internal Medicine

## 2010-08-24 DIAGNOSIS — R7302 Impaired glucose tolerance (oral): Secondary | ICD-10-CM

## 2010-08-24 DIAGNOSIS — Z0000 Encounter for general adult medical examination without abnormal findings: Secondary | ICD-10-CM

## 2010-08-24 DIAGNOSIS — R062 Wheezing: Secondary | ICD-10-CM

## 2010-08-24 DIAGNOSIS — J209 Acute bronchitis, unspecified: Secondary | ICD-10-CM

## 2010-08-24 DIAGNOSIS — R7309 Other abnormal glucose: Secondary | ICD-10-CM

## 2010-08-24 DIAGNOSIS — J309 Allergic rhinitis, unspecified: Secondary | ICD-10-CM

## 2010-08-24 HISTORY — DX: Impaired glucose tolerance (oral): R73.02

## 2010-08-24 MED ORDER — PREDNISONE 10 MG PO TABS: 10.0000 mg | ORAL_TABLET | Freq: Every day | ORAL | Status: DC

## 2010-08-24 MED ORDER — METHYLPREDNISOLONE ACETATE 80 MG/ML IJ SUSP
120.0000 mg | Freq: Once | INTRAMUSCULAR | Status: AC
  Administered 2010-08-24: 120 mg via INTRAMUSCULAR

## 2010-08-24 MED ORDER — MOMETASONE FUROATE 50 MCG/ACT NA SUSP: 2.0000 | Freq: Every day | NASAL | Status: DC

## 2010-08-24 MED ORDER — FEXOFENADINE HCL 180 MG PO TABS: 180.0000 mg | ORAL_TABLET | Freq: Every day | ORAL | Status: DC

## 2010-08-24 MED ORDER — AZITHROMYCIN 250 MG PO TABS: ORAL_TABLET | ORAL | Status: AC

## 2010-08-24 MED ORDER — BENZONATATE 100 MG PO CAPS: 100.0000 mg | ORAL_CAPSULE | Freq: Two times a day (BID) | ORAL | Status: DC | PRN

## 2010-08-24 NOTE — Assessment & Plan Note (Signed)
Mild to mod, ? Worsening - for depomedrol IM today, and prepack for home, and cough med

## 2010-08-24 NOTE — Assessment & Plan Note (Signed)
Also add the allegra for better control

## 2010-08-24 NOTE — Progress Notes (Signed)
Subjective:    Patient ID: Lauren Fox, female    DOB: Jul 15, 1965, 45 y.o.   MRN: [id number]  HPI  Here for acute onset 3 days fever, general malaise and weakness, prod cough with greenish sputum and mild wheezing/sob/doe;  Pt denies chest pain,  orthopnea, PND, increased LE swelling, palpitations, dizziness or syncope.  This is in addition to ongoing mild but worsening nasal allergy symptoms also increased over the past 4 wks, with clearish d/c, sneeze, itch, mild ST and nonprod cough, worse at night.  Pt denies new neurological symptoms such as new headache, or facial or extremity weakness or numbness.   Pt denies polydipsia, polyuria.  Overall good compliance with treatment, and good medicine tolerability.  Pt denies fever, wt loss, night sweats, loss of appetite, or other constitutional symptoms except with recent acute symptoms.    Past Medical History  Diagnosis Date  . GLUCOSE INTOLERANCE 11/25/2008  . HYPERLIPIDEMIA 11/25/2008  . Gout, unspecified 11/25/2008  . Morbid obesity 11/25/2008  . ALLERGIC RHINITIS 11/25/2008  . Unspecified backache 12/17/2009  . FOOT PAIN, LEFT 12/19/2009  . DIZZINESS 11/25/2008  . Headache 06/22/2010  . Impaired glucose tolerance 08/24/2010   Past Surgical History  Procedure Date  . Tubal ligation   . Abdominal hysterectomy 20063    non malignant  . S/p pilonidal cyst     reports that she has never smoked. She does not have any smokeless tobacco history on file. She reports that she drinks alcohol. She reports that she uses illicit drugs (Benzodiazepines). family history includes Coronary artery disease in her other; Diabetes in her father and others; Glaucoma in her father; Heart attack in her other; Hyperlipidemia in her mother; Hypertension in her father and mother; and Miscarriages / [place] in her brother. No Known Allergies Current Outpatient Prescriptions on File Prior to Visit  Medication Sig Dispense Refill  . DISCONTD: mometasone (NASONEX) 50  MCG/ACT nasal spray 2 sprays by Nasal route daily.        . cyclobenzaprine (FLEXERIL) 5 MG tablet Take 5 mg by mouth 3 (three) times daily as needed.        . naproxen (NAPROSYN) 500 MG tablet Take 500 mg by mouth. 1 by mouth two times a day for 5 days, then as needed for pain symptoms       . traMADol (ULTRAM) 50 MG tablet Take 50 mg by mouth. 1-2 by mouth every 6 hours as needed       . DISCONTD: cetirizine (ZYRTEC) 10 MG tablet Take 10 mg by mouth daily.         No current facility-administered medications on file prior to visit.   Review of Systems Review of Systems  Constitutional: Negative for diaphoresis and unexpected weight change.  HENT: Negative for drooling and tinnitus.   Eyes: Negative for photophobia and visual disturbance.  Respiratory: Negative for choking and stridor.   Gastrointestinal: Negative for vomiting and blood in stool.  Genitourinary: Negative for hematuria and decreased urine volume.  Musculoskeletal: Negative for gait problem.  Skin: Negative for color change and wound.  Neurological: Negative for tremors and numbness.  Psychiatric/Behavioral: Negative for decreased concentration. The patient is not hyperactive.       Objective:   Physical ExamBP 110/72  Pulse 73  Temp(Src) 98.4 F (36.9 C) (Oral)  Ht 5\' 4"  (1.626 m)  Wt 234 lb 8 oz (106.369 kg)  BMI 40.25 kg/m2  SpO2 99% Physical Exam  VS noted Constitutional: Pt appears well-developed and  well-nourished.  HENT: Head: Normocephalic.  Right Ear: External ear normal. bilat tm's mild erythema, sinus nontender Left Ear: External ear normal.  pharynx with cobblestoning appearance, Eyes: Conjunctivae and EOM are normal. Pupils are equal, round, and reactive to light.  Neck: Normal range of motion. Neck supple.  No LA Cardiovascular: Normal rate and regular rhythm.   Pulmonary/Chest: Effort normal and breath sounds normal.  Abd:  Soft, NT, non-distended, + BS Neurological: Pt is alert. No cranial  nerve deficit.  Skin: Skin is warm. No erythema.  Psychiatric: Pt behavior is normal. Thought content normal.          Assessment & Plan:

## 2010-08-24 NOTE — Patient Instructions (Addendum)
You had the steroid shot today Take all new medications as prescribed Continue all other medications as before Please return in 6 mo with Lab testing done 3-5 days before (the office will you)

## 2010-09-28 NOTE — Op Note (Signed)
Lauren Fox, Lauren Fox             ACCOUNT NO.:  [id number]   MEDICAL RECORD NO.:  [id number]          PATIENT TYPE:  AMB   LOCATION:  SDC                           FACILITY:  [facility]   PHYSICIAN:  Hal Morales, M.D.DATE OF BIRTH:  Jul 15, 1965   DATE OF PROCEDURE:  03/01/2007  DATE OF DISCHARGE:                               OPERATIVE REPORT   .   PREOPERATIVE DIAGNOSIS:  Irregular bleeding, pelvic pain and  dysmenorrhea.   POSTOPERATIVE DIAGNOSES:  Irregular bleeding, pelvic pain and  dysmenorrhea.   OPERATION:  Total vaginal hysterectomy and placement of vaginal packing.   SURGEON:  Hal Morales, M.D.   FIRST ASSISTANT:  Elmira J. Adline Peals.   ANESTHESIA:  General orotracheal.   ESTIMATED BLOOD LOSS:  125 mL.   COMPLICATIONS:  None.   FINDINGS:  Uterus was upper limits of normal size.  The ovaries were  normal bilaterally except for a left follicular cyst on the left ovary  and this ruptured on contact.  Uterus weighed 95 grams.   PROCEDURE:  The patient was taken to the operating room after  appropriate identification and placed on the operating table.  After the  attainment of adequate general anesthesia, she was placed in the  lithotomy position.  The perineum and vagina were prepped with multiple  layers of Betadine and draped as a sterile field.  A Foley catheter was  inserted into the bladder and connected to straight drainage.  A  weighted speculum was placed in the posterior vagina.  Lahey tenaculum  placed on the cervix and the cervicovaginal mucosa injected with a total  of 30 mL of dilute Pitressin.  The cervix was circumscribed and the  anterior vaginal mucosa dissected off the anterior cervix.  The  posterior peritoneum was entered and the uterosacral ligaments on the  right and left side clamped, cut, suture ligated and those sutures held.  The paracervical tissues were then clamped, cut and suture ligated.  The  anterior peritoneum was entered  and the bladder blade placed.  The  parametrial tissues were clamped, cut and suture ligated.  The uterus  was inverted and the upper pedicles clamped, cut, tied with a free tie  and suture ligated.  A McCall culdoplasty suture was then placed in the  posterior vagina incorporating the uterosacral ligaments on either side  and the intervening posterior peritoneum.  The vaginal angle sutures  were created with the sutures that had been held from the uterosacral  ligaments by placing them anterior-posterior on the vaginal cuff and  tying down.  The remainder of the vaginal cuff was then closed in a  single layer incorporating anterior vaginal mucosa, anterior peritoneum,  posterior peritoneum, and posterior vaginal mucosa in figure-of-eight  sutures.  All sutures were 0 Vicryl.  At this time hemostasis was noted  to be adequate and the [facility] culdoplasty suture was tied down.  The  vagina was packed with vaginal packing and the patient awakened from  general anesthesia and taken to the recovery room in satisfactory  condition having tolerated procedure well with sponge and instrument  counts  correct.   SPECIMENS TO PATHOLOGY:  Uterus and cervix.      Hal Morales, M.D.  Electronically Signed     VPH/MEDQ  D:  03/01/2007  T:  03/02/2007  Job:  [id number]

## 2010-09-28 NOTE — H&P (Signed)
NAMEANGELAMARIE, AVAKIAN             ACCOUNT NO.:  [id number]   MEDICAL RECORD NO.:  [id number]          PATIENT TYPE:  AMB   LOCATION:  SDC                           [facility]ILITY:  [facility]   PHYSICIAN:  Hal Morales, M.D.DATE OF BIRTH:  January 30, 1966   DATE OF ADMISSION:  03/01/2007  DATE OF DISCHARGE:                              HISTORY & PHYSICAL   HISTORY OF PRESENT ILLNESS:  Ms. Moise is a 45 year old, married,  African-American female, para 3-0-0-3 who presents for a total vaginal  hysterectomy because of irregular vaginal bleeding, dysmenorrhea and  pelvic pain.  Ms. Sek has a long history of pelvic pain and  irregular bleeding which was not relieved with nonsteroidal anti-  inflammatory medications and the patient declined oral contraceptives.  In December 2002, the patient underwent a diagnostic laparoscopy,  however there was no evidence of adhesions or endometriosis found.  Subsequently the patient was given a 10-month course of Lupron Depot with  good symptom relief; however, severe vasomotor symptoms required medical  intervention. The patient remained asymptomatic until 2006 when she  again complained of dyspareunia, severe dysmenorrhea and irregular  vaginal bleeding.  She would rate  her pain at a 10/10 on a 10-point  pain scale and decreasing only to 8/10 with nonsteroidal anti-  inflammatory medications.  As for her bleeding, it occurred every 2-4  weeks with the need to change her pad 2-4 times daily. She denied any  urinary tract symptoms, vaginitis symptoms, fever or changes in her  bowel movements.  Again, the patient declined oral contraceptives for  her treatment but opted to begin Provera 40 mg daily to achieve  amenorrhea.  The patient found that without bleeding she also was  without pain.  This therapy proved successful for her both irregular  bleeding and pelvic pain even when it was tapered to 10 mg per day.  Eventually the patient's weight began to  increase on Provera and she  sought other management options.  Consideration was given to endometrial  ablation, Mirena IUD, remaining on Provera high dose and hysterectomy.  Since the patient's pain was directly associated with her bleeding, she  decided to proceed with definitive therapy in the form of hysterectomy.   PAST MEDICAL HISTORY:   OB HISTORY:  Gravida 4, para 3-0-1-3.   GYN HISTORY:  Menarche 45 years old. Last menstrual period May 2006 due  to being on double Provera. Contraception bilateral tubal ligation.  The  patient does have a history of human papillomavirus. She has a history  of an abnormal Pap smear in 1988 which was treated with cryotherapy.  Her last normal Pap smear was December 2007.   MEDICAL HISTORY:  Negative.   SURGICAL HISTORY:  1994 bilateral tubal ligation, 1996 pilonidal cyst  excision, 2002 diagnostic laparoscopy.  She denies any problems with  anesthesia or history of blood transfusions.   FAMILY HISTORY:  Peptic ulcer disease, hypertension, diabetes mellitus,  migraines thyroid disease, and asthma.   SOCIAL HISTORY:  The patient is married and she works for [employer].   HABITS:  She does not use tobacco  or illicit drugs.  She consumes  alcohol on occasion.   CURRENT MEDICATIONS:  Provera 10 mg. She has no known drug allergies.   REVIEW OF SYSTEMS:  The patient occasionally has sinusitis; however, she  denies any chest pain, shortness of breath, headache, vision changes,  acid reflux and except as is mentioned in the history of present illness  the patient's review of systems is negative.   PHYSICAL EXAM:  Vital Signs:  Blood pressure 118/72, pulse is 80.  Height is 5 feet 4 inches tall.  Weight is 234 pounds.  NECK:  Supple without masses.  There is no thyromegaly or cervical  adenopathy.  HEART:  Regular rate and rhythm.  There is no murmur.  LUNGS:  Clear to auscultation.  There are no wheezes, rales or rhonchi.  BACK:  No  CVA tenderness.  ABDOMEN:  No tenderness, masses or organomegaly.  EXTREMITIES:  No clubbing, cyanosis or edema.  PELVIC:  EGBUS is within normal limits.  Vagina is rugous. Cervix is  nontender without lesions.  Uterus is upper limits of normal size  without tenderness.  Adnexa without tenderness or masses.  Rectovaginal  without tenderness or masses.   IMPRESSION:  1. Irregular bleeding.  2. Pelvic pain.  3. Dysmenorrhea.   DISPOSITION:  A discussion was held with the patient regarding the  indications for her procedure along with its risks which include but are  not limited to reaction to anesthesia, damage to adjacent organs,  infection, excessive bleeding and the possibility that an abdominal  incision may be needed to complete her surgery safely. The patient  verbalized understanding of these risk and has consented to proceed with  a total vaginal hysterectomy with a possibility of a total abdominal  hysterectomy at [facility] [place] on March 01, 2007 at  9:00 a.m.      Elmira J. Adline Peals.      Hal Morales, M.D.  Electronically Signed    EJP/MEDQ  D:  02/25/2007  T:  02/26/2007  Job:  [id number]

## 2010-10-01 NOTE — Discharge Summary (Signed)
NAMECELISE, BAZAR             ACCOUNT NO.:  [id number]   MEDICAL RECORD NO.:  [id number]          PATIENT TYPE:  OIB   LOCATION:  9311                          [facility]ACILITY:  [facility]   PHYSICIAN:  Hal Morales, M.D.DATE OF BIRTH:  Jan 07, 1966   DATE OF ADMISSION:  03/01/2007  DATE OF DISCHARGE:  03/02/2007                               DISCHARGE SUMMARY   DISCHARGE DIAGNOSIS:  1. Irregular bleeding.  2. Pelvic pain.  3. Dysmenorrhea.   OPERATION:  On the day of admission the patient underwent a total  vaginal hysterectomy tolerating procedure well.  The patient was found  to have a uterus which was upper limits of normal size with normal-  appearing ovaries bilaterally except for a left follicular cyst on the  left ovary which ruptured on contact.  The patient's uterus weighed 95  grams.   HISTORY OF PRESENT ILLNESS:  Ms. Longo is a 45 year old married  African American female para 3-0-0-3 who presents for hysterectomy  because of irregular bleeding, dysmenorrhea, and pelvic pain.  Please  see the patient's dictated history and physical examination for details.   Preoperative physical exam blood pressure 118/72, pulse 80, height 5  feet 4 inches tall.  Weight 234 pounds.  General exam was within normal  limits.  Pelvic exam EGD was within normal limits.  Vagina was rugose.  Cervix was nontender without lesions.  Uterus was upper limits of normal  size without tenderness.  Adnexa was without tenderness or mass masses  and rectovaginal was without tenderness or masses.   HOSPITAL COURSE:  On the day of admission the patient underwent  aforementioned procedure, tolerating it well.  Postoperative course was  unremarkable with patient resuming bowel and bladder function by postop  day #1; and, therefore, deemed ready for discharge home.  The patient's  postop hemoglobin was 12.5 (preop hemoglobin 14.0).   DISCHARGE MEDICATIONS:  1. Colace 100 mg twice daily until bowel  movements are regular.  2. Ibuprofen 600 mg with food q.6 h. for five days then as needed for      pain.  3. Percocet 5/325 one to two tablets q.4 h. as needed for pain.  4. Phenergan 25 mg q.6 h. as needed for nausea.   FOLLOWUP:  The patient is scheduled for a 6 weeks postoperative visit  with Dr. Pennie Rushing on April 16, 2007 at 10:30 a.m.   DISCHARGE INSTRUCTIONS:  The patient was given a copy of [facility] postoperative instruction sheet.  She was further  advised to avoid driving until she is no longer consuming narcotics no  heavy  lifting for 4 weeks, no intercourse for 6 weeks, that she may shower and  bathe, that she may walk up steps, that she should increase her activity  slowly.  The patient's diet was without restriction, and final pathology  was not available at the time of this discharge summary.      Elmira J. Adline Peals.      Hal Morales, M.D.  Electronically Signed    EJP/MEDQ  D:  04/03/2007  T:  04/04/2007  Job:  [id number] 

## 2010-10-01 NOTE — Op Note (Signed)
[facility]  Patient:    Lauren Fox, Lauren Fox Visit Number: [id number] MRN: [id number]          Service Type: DSU Location: [facility] Attending Physician:  Shaune Spittle Dictated by:   Maris Berger. Pennie Rushing, M.D. Proc. Date: 04/26/01 Admit Date:  04/26/2001 Discharge Date: 04/26/2001                             Operative Report  PREOPERATIVE DIAGNOSIS:       Pelvic pain and dyspareunia.  POSTOPERATIVE DIAGNOSIS:      Pelvic pain and dyspareunia.  OPERATION:                    Diagnostic laparoscopy.  SURGEON:                      Vanessa P. Pennie Rushing, M.D.  ANESTHESIA:                   General orotracheal.  ESTIMATED BLOOD LOSS:         Less than 10 cc.  COMPLICATIONS:                None.  FINDINGS:                     The uterus and ovaries were within normal limits.  The tubes were status post interruption for tubal sterilization.  The pelvic peritoneal surfaces were without evidence of adhesions or endometriosis.  DESCRIPTION OF PROCEDURE:     The patient was taken to the operating room after appropriate identification and placed on the operating table.  After the achievement of adequate general anesthesia, she was placed in the modified lithotomy position.  The abdomen, perineum, and vagina were prepped with multiple layers of Betadine.  A Foley catheter was inserted into the bladder and connected to straight drainage.  A single tooth tenaculum was placed on the cervix.  The abdomen was draped as a sterile field.  A subumbilical and suprapubic injection of 0.25% Marcaine was undertaken.  The subumbilical incision was made and a Veress cannula placed through that incision into the peritoneal cavity.  A pneumoperitoneum was created with 3 L of CO2.  The Veress cannula was removed and a laparoscopic trocar placed through the subumbilical incision.  The laparoscope was placed through the trocar sleeve. A suprapubic incision was made to  the left of the midline, and a laparoscopic probe trocar placed through that incision into the peritoneal cavity under direct visualization.  The above noted findings were made and documented, and no specific pelvic pathology noted.  All instruments were then removed from the peritoneal cavity under direct visualization as the CO2 was allowed to escape.  The suprapubic and subumbilical incisions were then closed with subcuticular sutures of 3-0 Vicryl.  The single tooth tenaculum and Foley catheter were removed.  The patient was awakened from general anesthesia and taken to the recovery room in satisfactory condition having tolerated the procedure well with sponge and instrument count correct. Dictated by:   Maris Berger. Pennie Rushing, M.D. Attending Physician:  Shaune Spittle DD:  04/26/01 TD:  04/26/01 Job: [id number]

## 2011-02-17 ENCOUNTER — Telehealth: Payer: Self-pay

## 2011-02-17 ENCOUNTER — Other Ambulatory Visit (INDEPENDENT_AMBULATORY_CARE_PROVIDER_SITE_OTHER): Payer: 59

## 2011-02-17 DIAGNOSIS — R5383 Other fatigue: Secondary | ICD-10-CM

## 2011-02-17 DIAGNOSIS — Z0000 Encounter for general adult medical examination without abnormal findings: Secondary | ICD-10-CM

## 2011-02-17 DIAGNOSIS — R7302 Impaired glucose tolerance (oral): Secondary | ICD-10-CM

## 2011-02-17 DIAGNOSIS — R5381 Other malaise: Secondary | ICD-10-CM

## 2011-02-17 DIAGNOSIS — R7309 Other abnormal glucose: Secondary | ICD-10-CM

## 2011-02-17 LAB — URINALYSIS, ROUTINE W REFLEX MICROSCOPIC
Leukocytes, UA: NEGATIVE
Nitrite: NEGATIVE
Specific Gravity, Urine: >=1.03 (ref 1.000–1.030)
Total Protein, Urine: NEGATIVE
pH: 6 (ref 5.0–8.0)

## 2011-02-17 LAB — HEPATIC FUNCTION PANEL
AST: 21 U/L (ref 0–37)
Albumin: 4.3 g/dL (ref 3.5–5.2)
Alkaline Phosphatase: 58 U/L (ref 39–117)
Bilirubin, Direct: 0 mg/dL (ref 0.0–0.3)
Total Protein: 7.6 g/dL (ref 6.0–8.3)

## 2011-02-17 LAB — CBC WITH DIFFERENTIAL/PLATELET
Basophils Absolute: 0 10*3/uL (ref 0.0–0.1)
Eosinophils Absolute: 0.1 10*3/uL (ref 0.0–0.7)
Lymphocytes Relative: 43.8 % (ref 12.0–46.0)
Lymphs Abs: 2.6 10*3/uL (ref 0.7–4.0)
MCHC: 34.2 g/dL (ref 30.0–36.0)
Monocytes Relative: 6.7 % (ref 3.0–12.0)
Platelets: 228 10*3/uL (ref 150.0–400.0)
RDW: 13.2 % (ref 11.5–14.6)

## 2011-02-17 LAB — BASIC METABOLIC PANEL
CO2: 25 mEq/L (ref 19–32)
Chloride: 108 mEq/L (ref 96–112)
Glucose, Bld: 92 mg/dL (ref 70–99)
Potassium: 4.1 mEq/L (ref 3.5–5.1)
Sodium: 141 mEq/L (ref 135–145)

## 2011-02-17 LAB — TSH: TSH: 2.67 u[IU]/mL (ref 0.35–5.50)

## 2011-02-17 NOTE — Telephone Encounter (Signed)
Put order in for lab. 

## 2011-02-18 LAB — HEMOGLOBIN A1C: Hgb A1c MFr Bld: 5.8 % (ref 4.6–6.5)

## 2011-02-23 ENCOUNTER — Ambulatory Visit (INDEPENDENT_AMBULATORY_CARE_PROVIDER_SITE_OTHER): Payer: 59 | Admitting: Internal Medicine

## 2011-02-23 ENCOUNTER — Encounter: Payer: Self-pay | Admitting: Internal Medicine

## 2011-02-23 VITALS — BP 120/80 | HR 75 | Temp 99.3°F | Ht 64.5 in | Wt 235.8 lb

## 2011-02-23 DIAGNOSIS — Z23 Encounter for immunization: Secondary | ICD-10-CM

## 2011-02-23 DIAGNOSIS — R7302 Impaired glucose tolerance (oral): Secondary | ICD-10-CM

## 2011-02-23 DIAGNOSIS — E559 Vitamin D deficiency, unspecified: Secondary | ICD-10-CM

## 2011-02-23 DIAGNOSIS — Z0000 Encounter for general adult medical examination without abnormal findings: Secondary | ICD-10-CM

## 2011-02-23 DIAGNOSIS — R7309 Other abnormal glucose: Secondary | ICD-10-CM

## 2011-02-23 LAB — CBC
MCV: 91.6
RBC: 3.91
WBC: 11.5 — ABNORMAL HIGH

## 2011-02-23 MED ORDER — PHENTERMINE HCL 37.5 MG PO CAPS: 37.5000 mg | ORAL_CAPSULE | ORAL | Status: DC

## 2011-02-23 NOTE — Assessment & Plan Note (Addendum)
Overall doing well, age appropriate education and counseling updated, referrals for preventative services and immunizations addressed, dietary and smoking counseling addressed, most recent labs and ECG reviewed.  I have personally reviewed and have noted: 1) the patient's medical and social history 2) The pt's use of alcohol, tobacco, and illicit drugs 3) The patient's current medications and supplements 4) Functional ability including ADL's, fall risk, home safety risk, hearing and visual impairment 5) Diet and physical activities 6) Evidence for depression or mood disorder 7) The patient's height, weight, and BMI have been recorded in the chart I have made referrals, and provided counseling and education based on review of the above OK for limited phentermine for wt loss today

## 2011-02-23 NOTE — Progress Notes (Signed)
Addended by: Scharlene Gloss B on: 02/23/2011 11:40 AM   Modules accepted: Orders

## 2011-02-23 NOTE — Progress Notes (Signed)
Subjective:    Patient ID: Lauren Fox, female    DOB: 25-Apr-1966, 45 y.o.   MRN: [id number]  HPI  Here for wellness and f/u;  Overall doing ok;  Pt denies CP, worsening SOB, DOE, wheezing, orthopnea, PND, worsening LE edema, palpitations, dizziness or syncope.  Pt denies neurological change such as new Headache, facial or extremity weakness.  Pt denies polydipsia, polyuria, or low sugar symptoms. Pt states overall good compliance with treatment and medications, good tolerability, and trying to follow lower cholesterol diet.  Pt denies worsening depressive symptoms, suicidal ideation or panic. No fever, wt loss, night sweats, loss of appetite, or other constitutional symptoms.  Pt states good ability with ADL's, low fall risk, home safety reviewed and adequate, no significant changes in hearing or vision, and occasionally active with exercise.  Has been trying to get more exercise recently, but seems to always start and stop.  Work from home.,l does not think she is overeating. Past Medical History  Diagnosis Date  . GLUCOSE INTOLERANCE 11/25/2008  . HYPERLIPIDEMIA 11/25/2008  . Gout, unspecified 11/25/2008  . Morbid obesity 11/25/2008  . ALLERGIC RHINITIS 11/25/2008  . Unspecified backache 12/17/2009  . FOOT PAIN, LEFT 12/19/2009  . DIZZINESS 11/25/2008  . Headache 06/22/2010  . Impaired glucose tolerance 08/24/2010   Past Surgical History  Procedure Date  . Tubal ligation   . Abdominal hysterectomy 20063    non malignant  . S/p pilonidal cyst     reports that she has never smoked. She does not have any smokeless tobacco history on file. She reports that she drinks alcohol. She reports that she uses illicit drugs (Benzodiazepines). family history includes Coronary artery disease in her other; Diabetes in her father and others; Glaucoma in her father; Heart attack in her other; Hyperlipidemia in her mother; Hypertension in her father and mother; and Miscarriages / [place] in her brother. No  Known Allergies Current Outpatient Prescriptions on File Prior to Visit  Medication Sig Dispense Refill  . benzonatate (TESSALON PERLES) 100 MG capsule Take 1 capsule (100 mg total) by mouth 2 (two) times daily as needed for cough.  60 capsule  1  . fexofenadine (ALLEGRA) 180 MG tablet Take 1 tablet (180 mg total) by mouth daily.  30 tablet  2  . mometasone (NASONEX) 50 MCG/ACT nasal spray 2 sprays by Nasal route daily.  17 g  2  . naproxen (NAPROSYN) 500 MG tablet Take 500 mg by mouth. 1 by mouth two times a day for 5 days, then as needed for pain symptoms        Review of Systems Review of Systems  Constitutional: Negative for diaphoresis, activity change, appetite change and unexpected weight change.  HENT: Negative for hearing loss, ear pain, facial swelling, mouth sores and neck stiffness.   Eyes: Negative for pain, redness and visual disturbance.  Respiratory: Negative for shortness of breath and wheezing.   Cardiovascular: Negative for chest pain and palpitations.  Gastrointestinal: Negative for diarrhea, blood in stool, abdominal distention and rectal pain.  Genitourinary: Negative for hematuria, flank pain and decreased urine volume.  Musculoskeletal: Negative for myalgias and joint swelling.  Skin: Negative for color change and wound.  Neurological: Negative for syncope and numbness.  Hematological: Negative for adenopathy.  Psychiatric/Behavioral: Negative for hallucinations, self-injury, decreased concentration and agitation.      Objective:   Physical Exam BP 120/80  Pulse 75  Temp(Src) 99.3 F (37.4 C) (Oral)  Ht 5' 4.5" (1.638 m)  Wt 235  lb 12 oz (106.935 kg)  BMI 39.84 kg/m2  SpO2 99% Physical Exam  VS noted Constitutional: Pt is oriented to person, place, and time. Appears well-developed and well-nourished.  Head: Normocephalic and atraumatic.  Right Ear: External ear normal.  Left Ear: External ear normal.  Nose: Nose normal.  Mouth/Throat: Oropharynx is  clear and moist.  Eyes: Conjunctivae and EOM are normal. Pupils are equal, round, and reactive to light.  Neck: Normal range of motion. Neck supple. No JVD present. No tracheal deviation present.  Cardiovascular: Normal rate, regular rhythm, normal heart sounds and intact distal pulses.   Pulmonary/Chest: Effort normal and breath sounds normal.  Abdominal: Soft. Bowel sounds are normal. There is no tenderness.  Musculoskeletal: Normal range of motion. Exhibits no edema.  Lymphadenopathy:  Has no cervical adenopathy.  Neurological: Pt is alert and oriented to person, place, and time. Pt has normal reflexes. No cranial nerve deficit.  Skin: Skin is warm and dry. No rash noted.  Psychiatric:  Has  normal mood and affect. Behavior is normal.     Assessment & Plan:

## 2011-02-23 NOTE — Patient Instructions (Addendum)
Take all new medications as prescribed - for wt loss Continue all other medications as before You had the flu shot today Your lab work was faxed to Dr Jeneen Rinks [facility] Please return in 1 year for your yearly visit, or sooner if needed, with Lab testing done 3-5 days before

## 2011-02-24 LAB — CBC
HCT: 40.7
Hemoglobin: 14
MCV: 90.6
RBC: 4.49
WBC: 5.3

## 2011-03-30 ENCOUNTER — Encounter: Payer: Self-pay | Admitting: Internal Medicine

## 2011-03-30 ENCOUNTER — Ambulatory Visit (INDEPENDENT_AMBULATORY_CARE_PROVIDER_SITE_OTHER): Payer: 59 | Admitting: Internal Medicine

## 2011-03-30 VITALS — BP 108/80 | HR 76 | Temp 98.4°F | Ht 64.5 in | Wt 230.2 lb

## 2011-03-30 DIAGNOSIS — J019 Acute sinusitis, unspecified: Secondary | ICD-10-CM

## 2011-03-30 DIAGNOSIS — J309 Allergic rhinitis, unspecified: Secondary | ICD-10-CM

## 2011-03-30 DIAGNOSIS — R7309 Other abnormal glucose: Secondary | ICD-10-CM

## 2011-03-30 DIAGNOSIS — R7302 Impaired glucose tolerance (oral): Secondary | ICD-10-CM

## 2011-03-30 MED ORDER — HYDROCODONE-HOMATROPINE 5-1.5 MG/5ML PO SYRP: 5.0000 mL | ORAL_SOLUTION | Freq: Four times a day (QID) | ORAL | Status: AC | PRN

## 2011-03-30 MED ORDER — LEVOFLOXACIN 500 MG PO TABS: 500.0000 mg | ORAL_TABLET | Freq: Every day | ORAL | Status: AC

## 2011-03-30 NOTE — Assessment & Plan Note (Signed)
Mild to mod, for antibx course,  to f/u any worsening symptoms or concerns 

## 2011-03-30 NOTE — Patient Instructions (Addendum)
Take all new medications as prescribed - the antibiotic and cough med (the cough med is given in hardcopy) Continue all other medications as before You are given the work note for being off work wed and thur this wk

## 2011-04-03 ENCOUNTER — Encounter: Payer: Self-pay | Admitting: Internal Medicine

## 2011-04-03 NOTE — Progress Notes (Signed)
  Subjective:    Patient ID: Lauren Fox, female    DOB: 1965-08-21, 45 y.o.   MRN: [id number]  HPI   Here with 3 days acute onset fever, facial pain, pressure, general weakness and malaise, and greenish d/c, with slight ST, but little to no cough and Pt denies chest pain, increased sob or doe, wheezing, orthopnea, PND, increased LE swelling, palpitations, dizziness or syncope  Also - Does have several wks ongoing nasal allergy symptoms with clear congestion, itch and sneeze, without fever, pain, ST, cough or wheezing.   Pt denies polydipsia, polyuria  Pt states overall good compliance with meds, trying to follow lower cholesterol, diabetic diet, wt overall stable but little exercise however.    Past Medical History  Diagnosis Date  . GLUCOSE INTOLERANCE 11/25/2008  . HYPERLIPIDEMIA 11/25/2008  . Gout, unspecified 11/25/2008  . Morbid obesity 11/25/2008  . ALLERGIC RHINITIS 11/25/2008  . Unspecified backache 12/17/2009  . FOOT PAIN, LEFT 12/19/2009  . DIZZINESS 11/25/2008  . Headache 06/22/2010  . Impaired glucose tolerance 08/24/2010   Past Surgical History  Procedure Date  . Tubal ligation   . Abdominal hysterectomy 20063    non malignant  . S/p pilonidal cyst     reports that she has never smoked. She does not have any smokeless tobacco history on file. She reports that she drinks alcohol. She reports that she uses illicit drugs (Benzodiazepines). family history includes Coronary artery disease in her other; Diabetes in her father and others; Glaucoma in her father; Heart attack in her other; Hyperlipidemia in her mother; Hypertension in her father and mother; and Miscarriages / [place] in her brother. No Known Allergies Current Outpatient Prescriptions on File Prior to Visit  Medication Sig Dispense Refill  . benzonatate (TESSALON PERLES) 100 MG capsule Take 1 capsule (100 mg total) by mouth 2 (two) times daily as needed for cough.  60 capsule  1  . fexofenadine (ALLEGRA) 180 MG tablet  Take 1 tablet (180 mg total) by mouth daily.  30 tablet  2  . mometasone (NASONEX) 50 MCG/ACT nasal spray 2 sprays by Nasal route daily.  17 g  2   Review of Systems Review of Systems  Constitutional: Negative for diaphoresis and unexpected weight change.  HENT: Negative for drooling and tinnitus.   Eyes: Negative for photophobia and visual disturbance.  Respiratory: Negative for choking and stridor.   Gastrointestinal: Negative for vomiting and blood in stool.  Genitourinary: Negative for hematuria and decreased urine volume.  Musculoskeletal: Negative for gait problem.       Objective:   Physical Exam BP 108/80  Pulse 76  Temp(Src) 98.4 F (36.9 C) (Oral)  Ht 5' 4.5" (1.638 m)  Wt 230 lb 4 oz (104.441 kg)  BMI 38.91 kg/m2  SpO2 99% Physical Exam  VS noted, mild ill Constitutional: Pt appears well-developed and well-nourished.  HENT: Head: Normocephalic.  Right Ear: External ear normal.  Left Ear: External ear normal.  Bilat tm's mild erythema.  Sinus tender.  Pharynx mild erythema Eyes: Conjunctivae and EOM are normal. Pupils are equal, round, and reactive to light.  Neck: Normal range of motion. Neck supple.  Cardiovascular: Normal rate and regular rhythm.   Pulmonary/Chest: Effort normal and breath sounds normal.  Neurological: Pt is alert. No cranial nerve deficit.  Skin: Skin is warm. No erythema.  Psychiatric: Pt behavior is normal. Thought content normal.     Assessment & Plan:

## 2011-04-03 NOTE — Assessment & Plan Note (Signed)
Asympt,  to f/u any worsening symptoms or concerns, d/w pt- to focus on wt diet, wt control

## 2011-04-03 NOTE — Assessment & Plan Note (Signed)
Mild to mod, for otc antihist prn,  to f/u any worsening symptoms or concerns  

## 2011-05-31 ENCOUNTER — Other Ambulatory Visit (HOSPITAL_COMMUNITY): Payer: Self-pay | Admitting: Obstetrics and Gynecology

## 2011-05-31 DIAGNOSIS — Z1231 Encounter for screening mammogram for malignant neoplasm of breast: Secondary | ICD-10-CM

## 2011-06-29 ENCOUNTER — Ambulatory Visit (HOSPITAL_COMMUNITY): Payer: 59

## 2011-06-30 ENCOUNTER — Emergency Department (HOSPITAL_COMMUNITY): Payer: 59

## 2011-06-30 ENCOUNTER — Emergency Department (HOSPITAL_COMMUNITY)
Admission: EM | Admit: 2011-06-30 | Discharge: 2011-06-30 | Disposition: A | Discharge status: Home / Self Care | Payer: 59 | Attending: Emergency Medicine | Admitting: Emergency Medicine

## 2011-06-30 ENCOUNTER — Encounter (HOSPITAL_COMMUNITY): Payer: Self-pay | Admitting: *Deleted

## 2011-06-30 DIAGNOSIS — M25562 Pain in left knee: Secondary | ICD-10-CM

## 2011-06-30 DIAGNOSIS — M25569 Pain in unspecified knee: Secondary | ICD-10-CM | POA: Insufficient documentation

## 2011-06-30 DIAGNOSIS — Z79899 Other long term (current) drug therapy: Secondary | ICD-10-CM | POA: Insufficient documentation

## 2011-06-30 DIAGNOSIS — E785 Hyperlipidemia, unspecified: Secondary | ICD-10-CM | POA: Insufficient documentation

## 2011-06-30 MED ORDER — HYDROCODONE-ACETAMINOPHEN 5-325 MG PO TABS: ORAL_TABLET | ORAL | Status: AC

## 2011-06-30 MED ORDER — NAPROXEN 500 MG PO TABS: 500.0000 mg | ORAL_TABLET | Freq: Two times a day (BID) | ORAL | Status: DC

## 2011-06-30 MED ORDER — OXYCODONE-ACETAMINOPHEN 5-325 MG PO TABS
1.0000 | ORAL_TABLET | Freq: Once | ORAL | Status: AC
  Administered 2011-06-30: 1 via ORAL
  Filled 2011-06-30: qty 1

## 2011-06-30 NOTE — ED Provider Notes (Signed)
History     CSN: [id number]  Arrival date & time 06/30/11  4098   First MD Initiated Contact with Patient 06/30/11 [phone]      Chief Complaint  Patient presents with  . Knee Pain    (Consider location/radiation/quality/duration/timing/severity/associated sxs/prior treatment) HPI Comments: 46 year old female presents with left knee pain after fall this AM about one hour ago. She slipped on ice outside of her house. She fell on her bent, left knee. She did not hit her head, she had no loss of consciousness. She denies pain in any other part of her body. She reports pain is currently a 7. She has not taken anything for pain, she came straight to the ED.  She denies hearing or feeling a popping or cracking sound/sensation when she fell. She denies loss of sensation distal to her knee.   Patient is a 46 y.o. female presenting with knee pain. The history is provided by the patient.  Knee Pain This is a new problem. The current episode started today. The problem occurs constantly. The problem has been unchanged. Associated symptoms include arthralgias. Pertinent negatives include no joint swelling, myalgias, neck pain, numbness or weakness. The symptoms are aggravated by bending, walking and standing. She has tried nothing for the symptoms.    Past Medical History  Diagnosis Date  . GLUCOSE INTOLERANCE 11/25/2008  . HYPERLIPIDEMIA 11/25/2008  . Gout, unspecified 11/25/2008  . Morbid obesity 11/25/2008  . ALLERGIC RHINITIS 11/25/2008  . Unspecified backache 12/17/2009  . FOOT PAIN, LEFT 12/19/2009  . DIZZINESS 11/25/2008  . Headache 06/22/2010  . Impaired glucose tolerance 08/24/2010    Past Surgical History  Procedure Date  . Tubal ligation   . Abdominal hysterectomy 20063    non malignant  . S/p pilonidal cyst     Family History  Problem Relation Age of Onset  . Hyperlipidemia Mother   . Hypertension Mother   . Glaucoma Father   . Hypertension Father   . Diabetes Father   .  Miscarriages / [place] Brother   . Diabetes Other   . Heart attack Other   . Coronary artery disease Other   . Diabetes Other     History  Substance Use Topics  . Smoking status: Never Smoker   . Smokeless tobacco: Not on file  . Alcohol Use: Yes     social    OB History    Grav Para Term Preterm Abortions TAB SAB Ect Mult Living                  Review of Systems  Constitutional: Negative for activity change.  HENT: Negative for neck pain.   Musculoskeletal: Positive for arthralgias. Negative for myalgias, back pain and joint swelling.  Skin: Negative for wound.  Neurological: Negative for weakness and numbness.    Allergies  Review of patient's allergies indicates no known allergies.  Home Medications   Current Outpatient Rx  Name Route Sig Dispense Refill  . CALCIUM + D PO Oral Take 1 tablet by mouth daily.    Marland Kitchen FEXOFENADINE HCL 180 MG PO TABS Oral Take 180 mg by mouth daily as needed. For allergies.    . MOMETASONE FUROATE 50 MCG/ACT NA SUSP Nasal Place 2 sprays into the nose daily as needed. For allergies.    Carma Leaven M PLUS PO TABS Oral Take 1 tablet by mouth daily.    Marland Kitchen NAPROXEN SODIUM 220 MG PO TABS Oral Take 220 mg by mouth every 8 (eight)  hours as needed. For pain.      BP 137/95  Pulse 69  Temp(Src) 98.9 F (37.2 C) (Oral)  Resp 16  SpO2 98%  Physical Exam  Nursing note and vitals reviewed. Constitutional: She is oriented to person, place, and time. She appears well-developed and well-nourished. No distress.  HENT:  Head: Normocephalic and atraumatic.  Eyes: Pupils are equal, round, and reactive to light.  Neck: Normal range of motion. Neck supple.  Cardiovascular: Exam reveals no decreased pulses.   Musculoskeletal: She exhibits tenderness. She exhibits no edema.       Left knee: She exhibits decreased range of motion (can fully extend knee, but has limited flexion secondary to pain. Can flex to 45 degrees.). She exhibits no swelling, no  effusion, no ecchymosis, no deformity, no laceration, normal patellar mobility and normal meniscus. tenderness found. Medial joint line and patellar tendon tenderness noted. No lateral joint line tenderness noted.  Neurological: She is alert and oriented to person, place, and time. No sensory deficit.       Motor, sensation, and vascular distal to the injury is fully intact.   Skin: Skin is warm and dry. She is not diaphoretic.  Psychiatric: She has a normal mood and affect.    ED Course  Procedures (including critical care time)  Labs Reviewed - No data to display Dg Knee Complete 4 Views Left  06/30/2011  *RADIOLOGY REPORT*  Clinical Data: Fall.  Medial pain.  LEFT KNEE - COMPLETE 4+ VIEW  Comparison: None.  Findings: No fracture or dislocation.  IMPRESSION: No fracture.  Original Report Authenticated By: Fuller Canada, M.D.     1. Pain in left knee     Patient seen and examined. X-ray ordered. Medications ordered.   Vital signs reviewed and are as follows: Filed Vitals:   06/30/11 0948  BP: 137/95  Pulse: 69  Temp: 98.9 F (37.2 C)  Resp: 16   X-ray reviewed by radiologist and by myself. Pt informed of results. No acute process.   Patient was counseled on RICE protocol and told to rest injury, use ice for no longer than 15 minutes every hour, compress the area, and elevate above the level of their heart as much as possible to reduce swelling.  Questions answered.  Patient verbalized understanding.    Patient counseled on use of narcotic pain medications. Counseled not to combine these medications with others containing tylenol. Urged not to drink alcohol, drive, or perform any other activities that requires focus while taking these medications. The patient verbalizes understanding and agrees with the plan.  Urged ortho/PCP f/u if no improvement in one week. Referral given.   Crutches and knee sleeve by ortho tech.    MDM  Knee injury, x-ray shows no fracture. Low  suspicion for occult tibial plateau fracture. Conservative therapy indicated with ortho f/u if no improvement in one week.         Eustace Moore [place], [place] 06/30/11 1542

## 2011-06-30 NOTE — ED Notes (Signed)
Pt reports slipping on ice this am, falling on L knee, lower leg bent under her. C/o pain to area below L knee cap. No swelling/deformity noted. Will not bend leg due to pain.

## 2011-06-30 NOTE — ED Notes (Signed)
Patient transported to X-ray 

## 2011-06-30 NOTE — Discharge Instructions (Signed)
Please read and follow instructions below.   The x-ray taken of your knee did not show any broken bones.   Used prescribed medications as directed.  Use crutches and knee sleeve as needed. Please rest, use ice, compress, and elevate the injuries as much as possible for the next several days.   If you do not have improvement in one week, please see your doctor or contact the orthopedic referral provided.   Please return to the Emergency Department if you have any concerns.

## 2011-06-30 NOTE — ED Provider Notes (Signed)
Medical screening examination/treatment/procedure(s) were performed by non-physician practitioner and as supervising physician I was immediately available for consultation/collaboration. Kortnee Bas, MD, FACEP   Tobey Lippard L Lyna Laningham, MD 06/30/11 2009 

## 2011-07-27 ENCOUNTER — Ambulatory Visit (INDEPENDENT_AMBULATORY_CARE_PROVIDER_SITE_OTHER): Payer: 59 | Admitting: Internal Medicine

## 2011-07-27 ENCOUNTER — Encounter: Payer: Self-pay | Admitting: Internal Medicine

## 2011-07-27 VITALS — BP 130/84 | HR 81 | Temp 98.4°F | Ht 64.5 in | Wt 224.2 lb

## 2011-07-27 DIAGNOSIS — J309 Allergic rhinitis, unspecified: Secondary | ICD-10-CM

## 2011-07-27 DIAGNOSIS — M549 Dorsalgia, unspecified: Secondary | ICD-10-CM | POA: Insufficient documentation

## 2011-07-27 DIAGNOSIS — R062 Wheezing: Secondary | ICD-10-CM

## 2011-07-27 DIAGNOSIS — R7302 Impaired glucose tolerance (oral): Secondary | ICD-10-CM

## 2011-07-27 DIAGNOSIS — R7309 Other abnormal glucose: Secondary | ICD-10-CM

## 2011-07-27 MED ORDER — HYDROCODONE-ACETAMINOPHEN 5-325 MG PO TABS: 1.0000 | ORAL_TABLET | Freq: Four times a day (QID) | ORAL | Status: AC | PRN

## 2011-07-27 MED ORDER — PREDNISONE 10 MG PO TABS: ORAL_TABLET | ORAL | Status: DC

## 2011-07-27 MED ORDER — NAPROXEN 500 MG PO TABS: 500.0000 mg | ORAL_TABLET | Freq: Two times a day (BID) | ORAL | Status: AC

## 2011-07-27 NOTE — Assessment & Plan Note (Signed)
conincidently should improved with predpack as well,Continue all other medications as before, to f/u any worsening symptoms or concerns

## 2011-07-27 NOTE — Progress Notes (Signed)
Subjective:    Patient ID: Lauren Fox, female    DOB: January 03, 1966, 46 y.o.   MRN: [id number]  HPI  Here with acute onset mod to severe constant LBP since mar 8, right side, feels like a knot, hax hx of sciatica with radicular pain only sort of fleeting twice during this episode;  No bowel or bladder change, fever, wt loss,  worsening LE numbness/weakness, gait change or falls. Has hx of 2 similar episode in the past 2-3 yrs which was not as severe, not as long lasting , max 2-3 days and resolved.  Has hx of lumbar disc dz s/p ESI x 1 about 2011 per [facility]/ortho which seemed to help so did not require further of planned 3 ESI.  Started after reaching down pick up something off the floor.   Pt denies chest pain, increased sob or doe, wheezing, orthopnea, PND, increased LE swelling, palpitations, dizziness or syncope.Does have several wks ongoing nasal allergy symptoms with clear congestion, itch and sneeze, without fever, pain, ST, cough or wheezing.   Pt denies polydipsia, polyuria. Past Medical History  Diagnosis Date  . GLUCOSE INTOLERANCE 11/25/2008  . HYPERLIPIDEMIA 11/25/2008  . Gout, unspecified 11/25/2008  . Morbid obesity 11/25/2008  . ALLERGIC RHINITIS 11/25/2008  . Unspecified backache 12/17/2009  . FOOT PAIN, LEFT 12/19/2009  . DIZZINESS 11/25/2008  . Headache 06/22/2010  . Impaired glucose tolerance 08/24/2010   Past Surgical History  Procedure Date  . Tubal ligation   . Abdominal hysterectomy 20063    non malignant  . S/p pilonidal cyst     reports that she has never smoked. She does not have any smokeless tobacco history on file. She reports that she drinks alcohol. She reports that she does not use illicit drugs. family history includes Coronary artery disease in her other; Diabetes in her father and others; Glaucoma in her father; Heart attack in her other; Hyperlipidemia in her mother; Hypertension in her father and mother; and Miscarriages / [place] in her  brother. No Known Allergies Current Outpatient Prescriptions on File Prior to Visit  Medication Sig Dispense Refill  . Calcium Carbonate-Vitamin D (CALCIUM + D PO) Take 1 tablet by mouth daily.      . fexofenadine (ALLEGRA) 180 MG tablet Take 180 mg by mouth daily as needed. For allergies.      . mometasone (NASONEX) 50 MCG/ACT nasal spray Place 2 sprays into the nose daily as needed. For allergies.      . Multiple Vitamins-Minerals (MULTIVITAMINS THER. W/MINERALS) TABS Take 1 tablet by mouth daily.      . naproxen sodium (ANAPROX) 220 MG tablet Take 220 mg by mouth every 8 (eight) hours as needed. For pain.      . naproxen (NAPROSYN) 500 MG tablet Take 1 tablet (500 mg total) by mouth 2 (two) times daily.  20 tablet  0   Review of Systems Review of Systems  Constitutional: Negative for diaphoresis and unexpected weight change.  HENT: Negative for drooling and tinnitus.   Eyes: Negative for photophobia and visual disturbance.  Respiratory: Negative for choking and stridor.   Gastrointestinal: Negative for vomiting and blood in stool.  Genitourinary: Negative for hematuria and decreased urine volume.      Objective:   Physical Exam Blood pressure 130/84, pulse 81, temperature 98.4 F (36.9 C), temperature source Oral, height 5' 4.5" (1.638 m), weight 224 lb 4 oz (101.719 kg), SpO2 98.00%. Physical Exam  VS noted Constitutional: Pt appears well-developed and well-nourished.  HENT: Head: Normocephalic.  Right Ear: External ear normal.  Left Ear: External ear normal.  Bilat tm's mild erythema.  Sinus nontender.  Pharynx mild erythema Eyes: Conjunctivae and EOM are normal. Pupils are equal, round, and reactive to light.  Neck: Normal range of motion. Neck supple.  Cardiovascular: Normal rate and regular rhythm.   Pulmonary/Chest: Effort normal and breath sounds normal.  Abd:  Soft, NT, non-distended, + BS Neurological: Pt is alert. No cranial nerve deficit. motor/sens/dtr intact, gait  normal \\spine :  Mod tender midline approx l5 and right paravertebral without swelling, erythema, rash Skin: Skin is warm. No erythema.  Psychiatric: Pt behavior is normal. Thought content normal.     Assessment & Plan:

## 2011-07-27 NOTE — Assessment & Plan Note (Addendum)
stable overall by hx and exam, most recent data reviewed with pt, and pt to continue medical treatment as before  Lab Results  Component Value Date   HGBA1C 5.8 02/17/2011  pt to call for onset polys or cbg > 200 on steroid tx

## 2011-07-27 NOTE — Assessment & Plan Note (Signed)
C/w flare of strain related to underlying lumbar DJD/DDD - ok for pain control, prednisone trial, has muscle relaxer at home prn, and f/u prn;  Exam stable but if changes may need ortho eval or MRI

## 2011-07-27 NOTE — Patient Instructions (Signed)
Take all new medications as prescribed - the pain medications, and prednisone Continue all other medications as before, including the muscle relaxer as needed Please call or return if not improved, as you may need MRI or return to orthopedic, but I am hoping not at this time

## 2011-08-10 ENCOUNTER — Ambulatory Visit (HOSPITAL_COMMUNITY)
Admission: RE | Admit: 2011-08-10 | Discharge: 2011-08-10 | Disposition: A | Discharge status: Home / Self Care | Payer: 59 | Source: Ambulatory Visit | Attending: Obstetrics and Gynecology | Admitting: Obstetrics and Gynecology

## 2011-08-10 DIAGNOSIS — Z1231 Encounter for screening mammogram for malignant neoplasm of breast: Secondary | ICD-10-CM | POA: Insufficient documentation

## 2011-09-01 ENCOUNTER — Telehealth: Payer: Self-pay | Admitting: Obstetrics and Gynecology

## 2011-09-01 NOTE — Telephone Encounter (Signed)
Routed to chandra 

## 2011-09-02 NOTE — Telephone Encounter (Signed)
LM ON VM TO CB PER TELEPHONE CALL.  

## 2011-09-02 NOTE — Telephone Encounter (Signed)
TC FROM PT. INFORMED PT CAN TRY OTC [place]. IF NO IMPROVEMENT, PT TO CALL FOR EVAL. PT VOICES UNDERSTANDING.

## 2012-02-15 ENCOUNTER — Other Ambulatory Visit (INDEPENDENT_AMBULATORY_CARE_PROVIDER_SITE_OTHER): Payer: 59

## 2012-02-15 DIAGNOSIS — R7302 Impaired glucose tolerance (oral): Secondary | ICD-10-CM

## 2012-02-15 DIAGNOSIS — Z0000 Encounter for general adult medical examination without abnormal findings: Secondary | ICD-10-CM

## 2012-02-15 DIAGNOSIS — R7309 Other abnormal glucose: Secondary | ICD-10-CM

## 2012-02-15 LAB — CBC WITH DIFFERENTIAL/PLATELET
Basophils Absolute: 0.1 10*3/uL (ref 0.0–0.1)
Eosinophils Absolute: 0.2 10*3/uL (ref 0.0–0.7)
Hemoglobin: 13.6 g/dL (ref 12.0–15.0)
Lymphocytes Relative: 47 % — ABNORMAL HIGH (ref 12.0–46.0)
Lymphs Abs: 3.4 10*3/uL (ref 0.7–4.0)
MCHC: 33.3 g/dL (ref 30.0–36.0)
MCV: 92.4 fl (ref 78.0–100.0)
Monocytes Absolute: 0.5 10*3/uL (ref 0.1–1.0)
Neutro Abs: 3.1 10*3/uL (ref 1.4–7.7)
RDW: 13.3 % (ref 11.5–14.6)

## 2012-02-15 LAB — BASIC METABOLIC PANEL
BUN: 13 mg/dL (ref 6–23)
CO2: 26 mEq/L (ref 19–32)
Calcium: 9.2 mg/dL (ref 8.4–10.5)
GFR: 94.81 mL/min (ref >60.00)
Glucose, Bld: 94 mg/dL (ref 70–99)

## 2012-02-15 LAB — LIPID PANEL
Cholesterol: 192 mg/dL (ref 0–200)
HDL: 47.5 mg/dL (ref >39.00)
Triglycerides: 71 mg/dL (ref 0.0–149.0)
VLDL: 14.2 mg/dL (ref 0.0–40.0)

## 2012-02-15 LAB — URINALYSIS, ROUTINE W REFLEX MICROSCOPIC
Hgb urine dipstick: NEGATIVE
Nitrite: NEGATIVE
Total Protein, Urine: NEGATIVE
pH: 6 (ref 5.0–8.0)

## 2012-02-15 LAB — HEPATIC FUNCTION PANEL: Albumin: 4.2 g/dL (ref 3.5–5.2)

## 2012-02-21 ENCOUNTER — Ambulatory Visit (INDEPENDENT_AMBULATORY_CARE_PROVIDER_SITE_OTHER): Payer: 59 | Admitting: Internal Medicine

## 2012-02-21 ENCOUNTER — Encounter: Payer: Self-pay | Admitting: Internal Medicine

## 2012-02-21 VITALS — BP 110/70 | HR 94 | Temp 97.8°F | Ht 64.5 in | Wt 228.2 lb

## 2012-02-21 DIAGNOSIS — E221 Hyperprolactinemia: Secondary | ICD-10-CM | POA: Insufficient documentation

## 2012-02-21 DIAGNOSIS — M519 Unspecified thoracic, thoracolumbar and lumbosacral intervertebral disc disorder: Secondary | ICD-10-CM | POA: Insufficient documentation

## 2012-02-21 DIAGNOSIS — Z23 Encounter for immunization: Secondary | ICD-10-CM

## 2012-02-21 DIAGNOSIS — R002 Palpitations: Secondary | ICD-10-CM | POA: Insufficient documentation

## 2012-02-21 DIAGNOSIS — Z0000 Encounter for general adult medical examination without abnormal findings: Secondary | ICD-10-CM

## 2012-02-21 HISTORY — DX: Unspecified thoracic, thoracolumbar and lumbosacral intervertebral disc disorder: M51.9

## 2012-02-21 MED ORDER — PHENTERMINE HCL 37.5 MG PO CAPS: 37.5000 mg | ORAL_CAPSULE | ORAL | Status: DC

## 2012-02-21 NOTE — Progress Notes (Signed)
Subjective:    Patient ID: Lauren Fox, female    DOB: [birth year], 46 y.o.   MRN: [id number]  HPI  Here for wellness and f/u;  Overall doing ok;  Pt denies CP, worsening SOB, DOE, wheezing, orthopnea, PND, worsening LE edema,  dizziness or syncope, but has had every 3 mo or so episode of unprovoked heart racing palpitations lasting 1-2 minutes with mile dizziness.   Pt denies neurological change such as new Headache, facial or extremity weakness.  Pt denies polydipsia, polyuria, or low sugar symptoms. Pt states overall good compliance with treatment and medications, good tolerability, and trying to follow lower cholesterol diet.  Pt denies worsening depressive symptoms, suicidal ideation or panic. No fever, wt loss, night sweats, loss of appetite, or other constitutional symptoms.  Pt states good ability with ADL's, low fall risk, home safety reviewed and adequate, no significant changes in hearing or vision, and occasionally active with exercise. Hard to lose wt. Past Medical History  Diagnosis Date  . GLUCOSE INTOLERANCE 11/25/2008  . HYPERLIPIDEMIA 11/25/2008  . Gout, unspecified 11/25/2008  . Morbid obesity 11/25/2008  . ALLERGIC RHINITIS 11/25/2008  . Unspecified backache 12/17/2009  . FOOT PAIN, LEFT 12/19/2009  . DIZZINESS 11/25/2008  . Headache 06/22/2010  . Impaired glucose tolerance 08/24/2010  . Lumbar disc disease 02/21/2012   Past Surgical History  Procedure Date  . Tubal ligation   . Abdominal hysterectomy 20063    non malignant  . S/p pilonidal cyst     reports that she has never smoked. She does not have any smokeless tobacco history on file. She reports that she drinks alcohol. She reports that she does not use illicit drugs. family history includes Coronary artery disease in her other; Diabetes in her father and others; Glaucoma in her father; Heart attack in her other; Hyperlipidemia in her mother; Hypertension in her father and mother; and Miscarriages / [place] in her  brother. No Known Allergies Current Outpatient Prescriptions on File Prior to Visit  Medication Sig Dispense Refill  . Calcium Carbonate-Vitamin D (CALCIUM + D PO) Take 1 tablet by mouth daily.      . fexofenadine (ALLEGRA) 180 MG tablet Take 180 mg by mouth daily as needed. For allergies.      . mometasone (NASONEX) 50 MCG/ACT nasal spray Place 2 sprays into the nose daily as needed. For allergies.      . Multiple Vitamins-Minerals (MULTIVITAMINS THER. W/MINERALS) TABS Take 1 tablet by mouth daily.      . naproxen (NAPROSYN) 500 MG tablet Take 1 tablet (500 mg total) by mouth 2 (two) times daily.  60 tablet  3  . phentermine 37.5 MG capsule Take 1 capsule (37.5 mg total) by mouth every morning.  30 capsule  2   Review of Systems Review of Systems  Constitutional: Negative for diaphoresis, activity change, appetite change and unexpected weight change.  HENT: Negative for hearing loss, ear pain, facial swelling, mouth sores and neck stiffness.   Eyes: Negative for pain, redness and visual disturbance.  Respiratory: Negative for shortness of breath and wheezing.   Cardiovascular: Negative for chest pain and palpitations.  Gastrointestinal: Negative for diarrhea, blood in stool, abdominal distention and rectal pain.  Genitourinary: Negative for hematuria, flank pain and decreased urine volume.  Musculoskeletal: Negative for myalgias and joint swelling.  Skin: Negative for color change and wound.  Neurological: Negative for syncope and numbness.  Hematological: Negative for adenopathy.  Psychiatric/Behavioral: Negative for hallucinations, self-injury, decreased concentration and agitation.  Objective:   Physical Exam BP 110/70  Pulse 94  Temp 97.8 F (36.6 C) (Oral)  Ht 5' 4.5" (1.638 m)  Wt 228 lb 4 oz (103.534 kg)  BMI 38.57 kg/m2  SpO2 98% Physical Exam  VS noted Constitutional: Pt is oriented to person, place, and time. Appears well-developed and well-nourished.  HENT:    Head: Normocephalic and atraumatic.  Right Ear: External ear normal.  Left Ear: External ear normal.  Nose: Nose normal.  Mouth/Throat: Oropharynx is clear and moist.  Eyes: Conjunctivae and EOM are normal. Pupils are equal, round, and reactive to light.  Neck: Normal range of motion. Neck supple. No JVD present. No tracheal deviation present.  Cardiovascular: Normal rate, regular rhythm, normal heart sounds and intact distal pulses.   Pulmonary/Chest: Effort normal and breath sounds normal.  Abdominal: Soft. Bowel sounds are normal. There is no tenderness.  Musculoskeletal: Normal range of motion. Exhibits no edema.  Lymphadenopathy:  Has no cervical adenopathy.  Neurological: Pt is alert and oriented to person, place, and time. Pt has normal reflexes. No cranial nerve deficit.  Skin: Skin is warm and dry. No rash noted.  Psychiatric:  Has  normal mood and affect. Behavior is normal.     Assessment & Plan:

## 2012-02-21 NOTE — Patient Instructions (Addendum)
You had the flu shot today Please remember to followup with your GYN for the yearly pap smear and/or mammogram, as you do You are given the copy of your lab work today Your EKG was ok today Please go to the ER if your palpitations do not go away in 1-2 minutes You are otherwise up to date with prevention Take all new medications as prescribed - the phentermine for wt loss Continue all other medications as before Please have the pharmacy call with any refills you may need. Please return in 1 year for your yearly visit, or sooner if needed, with Lab testing done 3-5 days before

## 2012-02-21 NOTE — Assessment & Plan Note (Signed)

## 2012-02-23 ENCOUNTER — Encounter: Payer: Self-pay | Admitting: Internal Medicine

## 2012-02-23 NOTE — Assessment & Plan Note (Addendum)
For increased exercise, reduced calories, limited phentermine rx

## 2012-02-23 NOTE — Assessment & Plan Note (Signed)
prob SVT by hx, very infreq and short lived, to go to ER for episode lasting longer

## 2012-05-30 ENCOUNTER — Encounter: Payer: Self-pay | Admitting: Internal Medicine

## 2012-05-30 ENCOUNTER — Ambulatory Visit (INDEPENDENT_AMBULATORY_CARE_PROVIDER_SITE_OTHER): Payer: 59 | Admitting: Internal Medicine

## 2012-05-30 ENCOUNTER — Telehealth: Payer: Self-pay | Admitting: Internal Medicine

## 2012-05-30 VITALS — BP 124/82 | HR 76 | Temp 98.2°F | Ht 64.5 in | Wt 235.0 lb

## 2012-05-30 DIAGNOSIS — J019 Acute sinusitis, unspecified: Secondary | ICD-10-CM

## 2012-05-30 MED ORDER — AZITHROMYCIN 250 MG PO TABS
ORAL_TABLET | ORAL | Status: DC
Start: 2012-05-30 — End: 2012-11-01

## 2012-05-30 MED ORDER — HYDROCODONE-HOMATROPINE 5-1.5 MG/5ML PO SYRP: 5.0000 mL | ORAL_SOLUTION | Freq: Three times a day (TID) | ORAL | Status: DC | PRN

## 2012-05-30 NOTE — Progress Notes (Signed)
HPI  Pt presents to the clinic with a 2 week history of sinus pain and pressure, headache, nasal congestion and cough. She does have bad allergies and is prone to sinus infections. She has been taking allegra and nasonex which has helped relieve some of her symptoms. She still has this lingering cough that is much worse at night and is beginning to keep her up. She has taken robitussin without relief. She has had sick contacts.  Review of Systems    Past Medical History  Diagnosis Date  . GLUCOSE INTOLERANCE 11/25/2008  . HYPERLIPIDEMIA 11/25/2008  . Gout, unspecified 11/25/2008  . Morbid obesity 11/25/2008  . ALLERGIC RHINITIS 11/25/2008  . Unspecified backache 12/17/2009  . FOOT PAIN, LEFT 12/19/2009  . DIZZINESS 11/25/2008  . Headache 06/22/2010  . Impaired glucose tolerance 08/24/2010  . Lumbar disc disease 02/21/2012    Family History  Problem Relation Age of Onset  . Hyperlipidemia Mother   . Hypertension Mother   . Glaucoma Father   . Hypertension Father   . Diabetes Father   . Miscarriages / [place] Brother   . Diabetes Other   . Heart attack Other   . Coronary artery disease Other   . Diabetes Other     History   Social History  . Marital Status: Married    Spouse Name: N/A    Number of Children: 3  . Years of Education: N/A   Occupational History  . inpt. coordinator [employer]    Social History Main Topics  . Smoking status: Never Smoker   . Smokeless tobacco: Not on file  . Alcohol Use: Yes     Comment: social  . Drug Use: No  . Sexually Active: Not on file   Other Topics Concern  . Not on file   Social History Narrative  . No narrative on file    No Known Allergies   Constitutional: Positive headache, fatigue and fever. Denies abrupt weight changes.  HEENT:  Positive eye pain, pressure behind the eyes, facial pain, nasal congestion and sore throat. Denies eye redness, ear pain, ringing in the ears, wax buildup, runny nose or bloody  nose. Respiratory: Positive cough. Denies difficulty breathing or shortness of breath.  Cardiovascular: Denies chest pain, chest tightness, palpitations or swelling in the hands or feet.   No other specific complaints in a complete review of systems (except as listed in HPI above).  Objective:    BP 124/82  Pulse 76  Temp 98.2 F (36.8 C) (Oral)  Ht 5' 4.5" (1.638 m)  Wt 235 lb (106.595 kg)  BMI 39.71 kg/m2  SpO2 96% Wt Readings from Last 3 Encounters:  05/30/12 235 lb (106.595 kg)  02/21/12 228 lb 4 oz (103.534 kg)  07/27/11 224 lb 4 oz (101.719 kg)    General: Appears her stated age, well developed, well nourished in NAD. HEENT: Head: normal shape and size; Eyes: sclera white, no icterus, conjunctiva pink, PERRLA and EOMs intact; Ears: Tm's gray and intact, normal light reflex; Nose: mucosa pink and moist, septum midline; Throat/Mouth: + PND. Teeth present, mucosa pink and moist, no exudate noted, no lesions or ulcerations noted.  Neck: Mild cervical lymphadenopathy. Neck supple, trachea midline. No massses, lumps or thyromegaly present.  Cardiovascular: Normal rate and rhythm. S1,S2 noted.  No murmur, rubs or gallops noted. No JVD or BLE edema. No carotid bruits noted. Pulmonary/Chest: Normal effort and positive vesicular breath sounds. No respiratory distress. No wheezes, rales or ronchi noted.  Assessment & Plan:   Acute bacterial sinusitis  Can use a Neti Pot which can be purchased from your local drug store. Continue allegra and Nasonex Azithromax x 5 days  RTC as needed or if symptoms persist.

## 2012-05-30 NOTE — Telephone Encounter (Signed)
Patient Information:  Caller Name: Lauren Fox  Phone: [phone]  Patient: Lauren Fox, Lauren Fox  Gender: Female  DOB: 08-21-1965  Age: 47 Years  PCP: Lauren Fox (Adults only)  Pregnant: No  Office Follow Up:  Does the office need to follow up with this patient?: Yes  Instructions For The Office: Patient is requesting something for cough to be called in.  Last seen by Dr. Jonny Fox 10/13.  Drug store of choice is [facility] on [address]  at [phone].  Patient realizes she may have to be seen before anything is called in.  RN Note:  Started with allergies with cough.  The allegra and nasonex of respiratory symptoms but not cough.  Has tried Robitussin and cough drops and not effective.  Denies color to nose drainage.  Cough is productive but can't cough it up.  Denies chest tightness, congestion, or wheezing.  Patient feels like it is just a cough. Coughs more during the day.  If she talks too long or if laughs will start coughing.  Symptoms  Reason For Call & Symptoms: Calling to request a cough medicine.  Reviewed Health History In EMR: Yes  Reviewed Medications In EMR: Yes  Reviewed Allergies In EMR: Yes  Reviewed Surgeries / Procedures: Yes  Date of Onset of Symptoms: 05/16/2012  Treatments Tried: Robitussin and cough drops  Treatments Tried Worked: No OB / GYN:  LMP: Unknown  Guideline(s) Used:  Cough  Disposition Per Guideline:   Home Care  Reason For Disposition Reached:   Cough with no complications  Advice Given:  Coughing Spasms:  Drink warm fluids. Inhale warm mist (Reason: both relax the airway and loosen up the phlegm).  Suck on cough drops or hard candy to coat the irritated throat.  Cough Medicines:  Home Remedy - Honey: This old home remedy has been shown to help decrease coughing at night. The adult dosage is 2 teaspoons (10 ml) at bedtime. Honey should not be given to infants under one year of age.  OTC Cough Syrup - Dextromethorphan:  Cough syrups containing the  cough suppressant dextromethorphan (DM) may help decrease your cough. Cough syrups work best for coughs that keep you awake at night. They can also sometimes help in the late stages of a respiratory infection when the cough is dry and hacking. They can be used along with cough drops.  Examples: Benylin, Robitussin DM, Vicks 44 Cough Relief  Caution - Dextromethorphan:   Do not try to completely suppress coughs that produce mucus and phlegm. Remember that coughing is helpful in bringing up mucus from the lungs and preventing pneumonia.  Coughing Spasms:  Drink warm fluids. Inhale warm mist (Reason: both relax the airway and loosen up the phlegm).  Suck on cough drops or hard candy to coat the irritated throat.  Prevent Dehydration:  Drink adequate liquids.  Avoid Tobacco Smoke:  Smoking or being exposed to smoke makes coughs much worse.  Call Back If:  Difficulty breathing  Cough lasts more than 3 weeks  Fever lasts > 3 days  You become worse.

## 2012-05-30 NOTE — Patient Instructions (Signed)

## 2012-05-30 NOTE — Telephone Encounter (Signed)
Pt is aware to come. She will check with her boss to see if she can come today or tomorrow.

## 2012-05-30 NOTE — Telephone Encounter (Signed)
Ok to offer appt with regina or me

## 2012-07-09 ENCOUNTER — Other Ambulatory Visit: Payer: Self-pay | Admitting: Obstetrics and Gynecology

## 2012-07-09 DIAGNOSIS — Z1231 Encounter for screening mammogram for malignant neoplasm of breast: Secondary | ICD-10-CM

## 2012-08-14 ENCOUNTER — Ambulatory Visit (HOSPITAL_COMMUNITY)
Admission: RE | Admit: 2012-08-14 | Discharge: 2012-08-14 | Disposition: A | Discharge status: Home / Self Care | Payer: 59 | Source: Ambulatory Visit | Attending: Obstetrics and Gynecology | Admitting: Obstetrics and Gynecology

## 2012-08-14 DIAGNOSIS — Z1231 Encounter for screening mammogram for malignant neoplasm of breast: Secondary | ICD-10-CM | POA: Insufficient documentation

## 2012-10-30 ENCOUNTER — Ambulatory Visit: Payer: 59 | Admitting: Internal Medicine

## 2012-11-01 ENCOUNTER — Ambulatory Visit (INDEPENDENT_AMBULATORY_CARE_PROVIDER_SITE_OTHER): Payer: 59 | Admitting: Internal Medicine

## 2012-11-01 ENCOUNTER — Encounter: Payer: Self-pay | Admitting: Internal Medicine

## 2012-11-01 VITALS — BP 110/72 | HR 94 | Temp 98.6°F | Ht 64.5 in | Wt 234.0 lb

## 2012-11-01 DIAGNOSIS — E785 Hyperlipidemia, unspecified: Secondary | ICD-10-CM

## 2012-11-01 DIAGNOSIS — J309 Allergic rhinitis, unspecified: Secondary | ICD-10-CM

## 2012-11-01 DIAGNOSIS — R7309 Other abnormal glucose: Secondary | ICD-10-CM

## 2012-11-01 DIAGNOSIS — R7302 Impaired glucose tolerance (oral): Secondary | ICD-10-CM

## 2012-11-01 DIAGNOSIS — J019 Acute sinusitis, unspecified: Secondary | ICD-10-CM

## 2012-11-01 MED ORDER — MOMETASONE FUROATE 50 MCG/ACT NA SUSP: 2.0000 | Freq: Every day | NASAL | Status: DC | PRN

## 2012-11-01 MED ORDER — LEVOFLOXACIN 250 MG PO TABS: 250.0000 mg | ORAL_TABLET | Freq: Every day | ORAL | Status: DC

## 2012-11-01 MED ORDER — PHENTERMINE HCL 37.5 MG PO CAPS: 37.5000 mg | ORAL_CAPSULE | ORAL | Status: DC

## 2012-11-01 NOTE — Assessment & Plan Note (Signed)
stable overall by history and exam, recent data reviewed with pt, and pt to continue medical treatment as before,  to f/u any worsening symptoms or concerns Lab Results  Component Value Date   HGBA1C 5.5 02/15/2012    

## 2012-11-01 NOTE — Assessment & Plan Note (Signed)
Mild to mod, for antibx course,  to f/u any worsening symptoms or concerns 

## 2012-11-01 NOTE — Assessment & Plan Note (Signed)
stable overall by history and exam, recent data reviewed with pt, and pt to continue medical treatment as before,  to f/u any worsening symptoms or concerns Lab Results  Component Value Date   LDLCALC 130* 02/15/2012

## 2012-11-01 NOTE — Progress Notes (Signed)
Subjective:    Patient ID: Lauren Fox, female    DOB: 1965-10-24, 47 y.o.   MRN: [id number]  HPI  Here with 2-3 days acute onset fever, facial pain, pressure, headache, general weakness and malaise, and greenish d/c, with mild ST and cough, but pt denies chest pain, wheezing, increased sob or doe, orthopnea, PND, increased LE swelling, palpitations, dizziness or syncope.   Pt denies polydipsia, polyuria, or low sugar symptoms.  Pt denies new neurological symptoms such as new headache, or facial or extremity weakness or numbness  No other acute complaints  Never took the phentermine last visit, by the time she filled the rx it was too old Past Medical History  Diagnosis Date  . GLUCOSE INTOLERANCE 11/25/2008  . HYPERLIPIDEMIA 11/25/2008  . Gout, unspecified 11/25/2008  . Morbid obesity 11/25/2008  . ALLERGIC RHINITIS 11/25/2008  . Unspecified backache 12/17/2009  . FOOT PAIN, LEFT 12/19/2009  . DIZZINESS 11/25/2008  . Headache(784.0) 06/22/2010  . Impaired glucose tolerance 08/24/2010  . Lumbar disc disease 02/21/2012   Past Surgical History  Procedure Laterality Date  . Tubal ligation    . Abdominal hysterectomy  20063    non malignant  . S/p pilonidal cyst      reports that she has never smoked. She does not have any smokeless tobacco history on file. She reports that  drinks alcohol. She reports that she does not use illicit drugs. family history includes Coronary artery disease in her other; Diabetes in her father and others; Glaucoma in her father; Heart attack in her other; Hyperlipidemia in her mother; Hypertension in her father and mother; and Miscarriages / [place] in her brother. No Known Allergies Current Outpatient Prescriptions on File Prior to Visit  Medication Sig Dispense Refill  . Calcium Carbonate-Vitamin D (CALCIUM + D PO) Take 1 tablet by mouth daily.      . fexofenadine (ALLEGRA) 180 MG tablet Take 180 mg by mouth daily as needed. For allergies.      . Multiple  Vitamins-Minerals (MULTIVITAMINS THER. W/MINERALS) TABS Take 1 tablet by mouth daily.      . phentermine 37.5 MG capsule Take 1 capsule (37.5 mg total) by mouth every morning.  30 capsule  2   No current facility-administered medications on file prior to visit.   Review of Systems  Constitutional: Negative for unexpected weight change, or unusual diaphoresis  HENT: Negative for tinnitus.   Eyes: Negative for photophobia and visual disturbance.  Respiratory: Negative for choking and stridor.   Gastrointestinal: Negative for vomiting and blood in stool.  Genitourinary: Negative for hematuria and decreased urine volume.  Musculoskeletal: Negative for acute joint swelling Skin: Negative for color change and wound.  Neurological: Negative for tremors and numbness other than noted  Psychiatric/Behavioral: Negative for decreased concentration or  hyperactivity.       Objective:   Physical Exam BP 110/72  Pulse 94  Temp(Src) 98.6 F (37 C) (Oral)  Ht 5' 4.5" (1.638 m)  Wt 234 lb (106.142 kg)  BMI 39.56 kg/m2  SpO2 97% VS noted, mild ill Constitutional: Pt appears well-developed and well-nourished.  HENT: Head: NCAT.  Right Ear: External ear normal.  Left Ear: External ear normal.  Bilat tm's with mild erythema.  Max sinus areas mild tender.  Pharynx with mild erythema, no exudate Eyes: Conjunctivae and EOM are normal. Pupils are equal, round, and reactive to light.  Neck: Normal range of motion. Neck supple.  Cardiovascular: Normal rate and regular rhythm.   Pulmonary/Chest: Effort  normal and breath sounds normal.  Neurological: Pt is alert. Not confused  Skin: Skin is warm. No erythema.  Psychiatric: Pt behavior is normal. Thought content normal.     Assessment & Plan:

## 2012-11-01 NOTE — Patient Instructions (Signed)
Please take all new medication as prescribed You can also take Delsym OTC for cough, and/or Mucinex (or it's generic off brand) for congestion, and tylenol as needed for pain. Please continue all other medications as before, and refills have been done if requested, including the allergy meds, and the phentermine  Please remember to sign up for My Chart if you have not done so, as this will be important to you in the future with finding out test results, communicating by private email, and scheduling acute appointments online when needed.

## 2012-11-01 NOTE — Assessment & Plan Note (Signed)
stable overall by history and exam, recent data reviewed with pt, and pt to continue medical treatment as before,  to f/u any worsening symptoms or concerns Lab Results  Component Value Date   WBC 7.2 02/15/2012   HGB 13.6 02/15/2012   HCT 40.6 02/15/2012   PLT 235.0 02/15/2012   GLUCOSE 94 02/15/2012   CHOL 192 02/15/2012   TRIG 71.0 02/15/2012   HDL 47.50 02/15/2012   LDLCALC 130* 02/15/2012   ALT 22 02/15/2012   AST 20 02/15/2012   NA 139 02/15/2012   K 4.4 02/15/2012   CL 105 02/15/2012   CREATININE 0.8 02/15/2012   BUN 13 02/15/2012   CO2 26 02/15/2012   TSH 5.30 02/15/2012   HGBA1C 5.5 02/15/2012   MICROALBUR 0.4 06/19/2009

## 2012-12-26 ENCOUNTER — Other Ambulatory Visit: Payer: Self-pay

## 2013-01-01 MED ORDER — NAPROXEN 500 MG PO TABS: 500.0000 mg | ORAL_TABLET | Freq: Two times a day (BID) | ORAL | Status: DC

## 2013-01-01 NOTE — Telephone Encounter (Signed)
Faxed hardcopy of Naproxen to [facility] per [place] request.

## 2013-01-02 ENCOUNTER — Emergency Department (HOSPITAL_COMMUNITY)
Admission: EM | Admit: 2013-01-02 | Discharge: 2013-01-02 | Disposition: A | Discharge status: Home / Self Care | Payer: 59 | Attending: Emergency Medicine | Admitting: Emergency Medicine

## 2013-01-02 ENCOUNTER — Encounter (HOSPITAL_COMMUNITY): Payer: Self-pay | Admitting: Emergency Medicine

## 2013-01-02 DIAGNOSIS — H81399 Other peripheral vertigo, unspecified ear: Secondary | ICD-10-CM

## 2013-01-02 DIAGNOSIS — F17200 Nicotine dependence, unspecified, uncomplicated: Secondary | ICD-10-CM | POA: Insufficient documentation

## 2013-01-02 DIAGNOSIS — Z79899 Other long term (current) drug therapy: Secondary | ICD-10-CM | POA: Insufficient documentation

## 2013-01-02 DIAGNOSIS — M109 Gout, unspecified: Secondary | ICD-10-CM | POA: Insufficient documentation

## 2013-01-02 DIAGNOSIS — R42 Dizziness and giddiness: Secondary | ICD-10-CM | POA: Insufficient documentation

## 2013-01-02 DIAGNOSIS — Z8639 Personal history of other endocrine, nutritional and metabolic disease: Secondary | ICD-10-CM | POA: Insufficient documentation

## 2013-01-02 DIAGNOSIS — Z8739 Personal history of other diseases of the musculoskeletal system and connective tissue: Secondary | ICD-10-CM | POA: Insufficient documentation

## 2013-01-02 DIAGNOSIS — E669 Obesity, unspecified: Secondary | ICD-10-CM | POA: Insufficient documentation

## 2013-01-02 DIAGNOSIS — Z862 Personal history of diseases of the blood and blood-forming organs and certain disorders involving the immune mechanism: Secondary | ICD-10-CM | POA: Insufficient documentation

## 2013-01-02 LAB — POCT I-STAT, CHEM 8
Calcium, Ion: 1.15 mmol/L (ref 1.12–1.23)
Glucose, Bld: 99 mg/dL (ref 70–99)
HCT: 41 % (ref 36.0–46.0)
TCO2: 23 mmol/L (ref 0–100)

## 2013-01-02 MED ORDER — MECLIZINE HCL 25 MG PO TABS: 25.0000 mg | ORAL_TABLET | Freq: Three times a day (TID) | ORAL | Status: DC | PRN

## 2013-01-02 MED ORDER — ONDANSETRON 4 MG PO TBDP: 4.0000 mg | ORAL_TABLET | Freq: Three times a day (TID) | ORAL | Status: DC | PRN

## 2013-01-02 MED ORDER — ONDANSETRON 4 MG PO TBDP
4.0000 mg | ORAL_TABLET | Freq: Once | ORAL | Status: AC
  Administered 2013-01-02: 4 mg via ORAL
  Filled 2013-01-02: qty 1

## 2013-01-02 MED ORDER — MECLIZINE HCL 25 MG PO TABS
25.0000 mg | ORAL_TABLET | Freq: Once | ORAL | Status: AC
  Administered 2013-01-02: 25 mg via ORAL
  Filled 2013-01-02: qty 1

## 2013-01-02 NOTE — ED Notes (Signed)
Pt escorted to discharge window. Pt verbalized understanding discharge instructions. In no acute distress.  

## 2013-01-02 NOTE — ED Provider Notes (Signed)
CSN: [id number]     Arrival date & time 01/02/13  0620 History   First MD Initiated Contact with Patient 01/02/13 [phone]     Chief Complaint  Patient presents with  . Dizziness    HPI Comments: Pt complains of dizziness.  Feels like the room is spinning.  Worsens with movement.  Resolved when still or at rest.  No trouble with movement, speech and vision.  She does have some ringing in her ears as well as some pressure in the ears.  No n/v.  No headache but some sinus pressure earlier.  None now.  Patient is a 47 y.o. female presenting with neurologic complaint. The history is provided by the patient.  Neurologic Problem This is a new problem. The current episode started yesterday. The problem has been gradually worsening. She has tried nothing for the symptoms.    Past Medical History  Diagnosis Date  . GLUCOSE INTOLERANCE 11/25/2008  . HYPERLIPIDEMIA 11/25/2008  . Gout, unspecified 11/25/2008  . Morbid obesity 11/25/2008  . ALLERGIC RHINITIS 11/25/2008  . Unspecified backache 12/17/2009  . FOOT PAIN, LEFT 12/19/2009  . DIZZINESS 11/25/2008  . Headache(784.0) 06/22/2010  . Impaired glucose tolerance 08/24/2010  . Lumbar disc disease 02/21/2012   Past Surgical History  Procedure Laterality Date  . Tubal ligation    . Abdominal hysterectomy  20063    non malignant  . S/p pilonidal cyst     Family History  Problem Relation Age of Onset  . Hyperlipidemia Mother   . Hypertension Mother   . Glaucoma Father   . Hypertension Father   . Diabetes Father   . Diabetes Other   . Heart attack Other   . Coronary artery disease Other   . Diabetes Other    History  Substance Use Topics  . Smoking status: Current Every Day Smoker    Types: Cigarettes  . Smokeless tobacco: Not on file  . Alcohol Use: Yes     Comment: social   OB History   Grav Para Term Preterm Abortions TAB SAB Ect Mult Living                 Review of Systems  All other systems reviewed and are negative.    Allergies   Review of patient's allergies indicates no known allergies.  Home Medications   Current Outpatient Rx  Name  Route  Sig  Dispense  Refill  . naproxen (NAPROSYN) 500 MG tablet   Oral   Take 1 tablet (500 mg total) by mouth 2 (two) times daily with a meal. As needed   60 tablet   2   . meclizine (ANTIVERT) 25 MG tablet   Oral   Take 1 tablet (25 mg total) by mouth 3 (three) times daily as needed.   30 tablet   0   . ondansetron (ZOFRAN-ODT) 4 MG disintegrating tablet   Oral   Take 1 tablet (4 mg total) by mouth every 8 (eight) hours as needed for nausea.   20 tablet   0    BP 129/77  Pulse 72  Temp(Src) 98.5 F (36.9 C) (Oral)  Resp 14  SpO2 97% Physical Exam  Nursing note and vitals reviewed. Constitutional: She is oriented to person, place, and time. She appears well-developed and well-nourished. No distress.  HENT:  Head: Normocephalic and atraumatic.  Right Ear: External ear normal.  Left Ear: External ear normal.  Mouth/Throat: Oropharynx is clear and moist.  Eyes: Conjunctivae are normal. Right eye  exhibits no discharge. Left eye exhibits no discharge. No scleral icterus.  Neck: Neck supple. No tracheal deviation present.  Cardiovascular: Normal rate, regular rhythm and intact distal pulses.   Pulmonary/Chest: Effort normal and breath sounds normal. No stridor. No respiratory distress. She has no wheezes. She has no rales.  Abdominal: Soft. Bowel sounds are normal. She exhibits no distension. There is no tenderness. There is no rebound and no guarding.  Musculoskeletal: She exhibits no edema and no tenderness.  Neurological: She is alert and oriented to person, place, and time. She has normal strength. No cranial nerve deficit ( no gross defecits noted) or sensory deficit. She exhibits normal muscle tone. She displays no seizure activity. Coordination normal.  No pronator drift bilateral upper extrem, able to hold both legs off bed for 5 seconds, sensation intact  in all extremities, no visual field cuts, no left or right sided neglect  Skin: Skin is warm and dry. No rash noted.  Psychiatric: She has a normal mood and affect.    ED Course   Procedures (including critical care time)  Labs Reviewed  POCT I-STAT, CHEM 8   No results found. 1. Peripheral vertigo, unspecified laterality     MDM  Patient's symptoms are consistent with peripheral vertigo.  She is a normal neurologic exam. She is not anemic or hyperglycemic.  The patient be discharged home with prescription for meclizine.  Celene Kras, MD 01/02/13 [phone]

## 2013-01-02 NOTE — ED Notes (Signed)
Pt states she woke up yesterday morning and when she sat up the room started spinning so she laid back down  Later it was ok but this morning when she got up she felt the same   Pt states she feels pressure in her head in her sinus area  Pt states she has sinus allergies but has never felt like this before

## 2013-02-12 IMAGING — CR DG KNEE COMPLETE 4+V*L*
4 series · 4 of 4 positions shown · non-contrast
Comparison: None.

CLINICAL DATA: Fall.  Medial pain.

LEFT KNEE - COMPLETE 4+ VIEW

[t knee ap left]
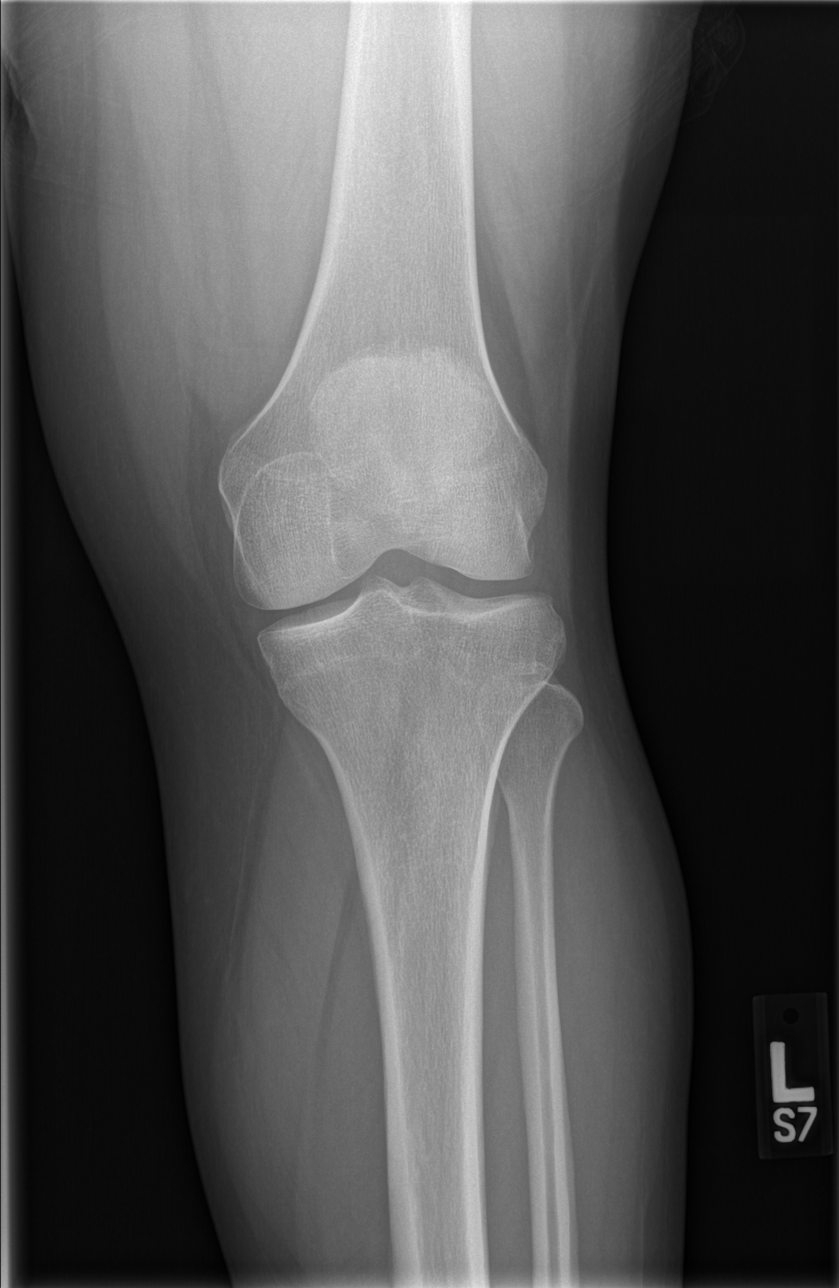

[t knee obl left (1 of 2)]
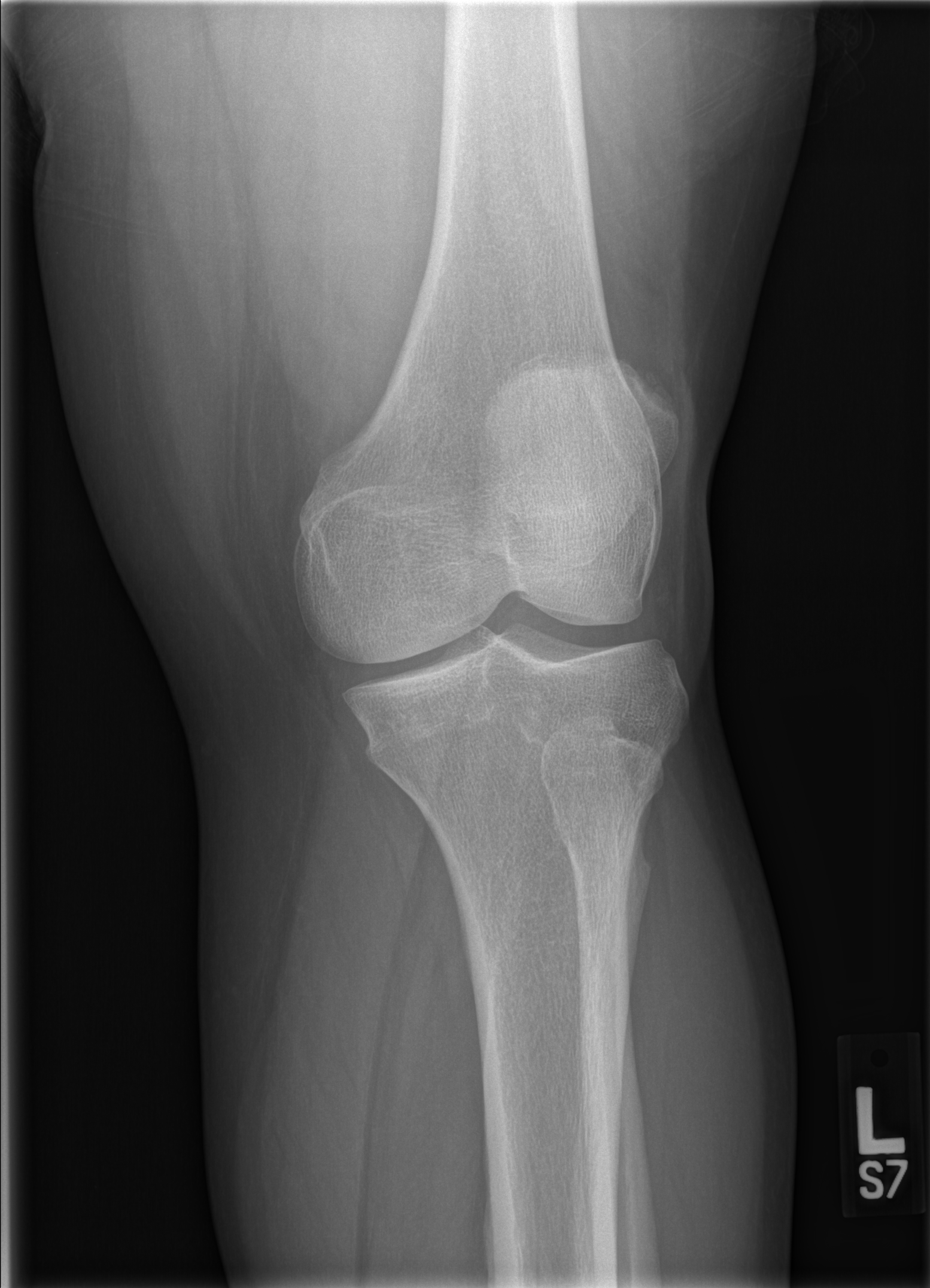

[t knee obl left (2 of 2)]
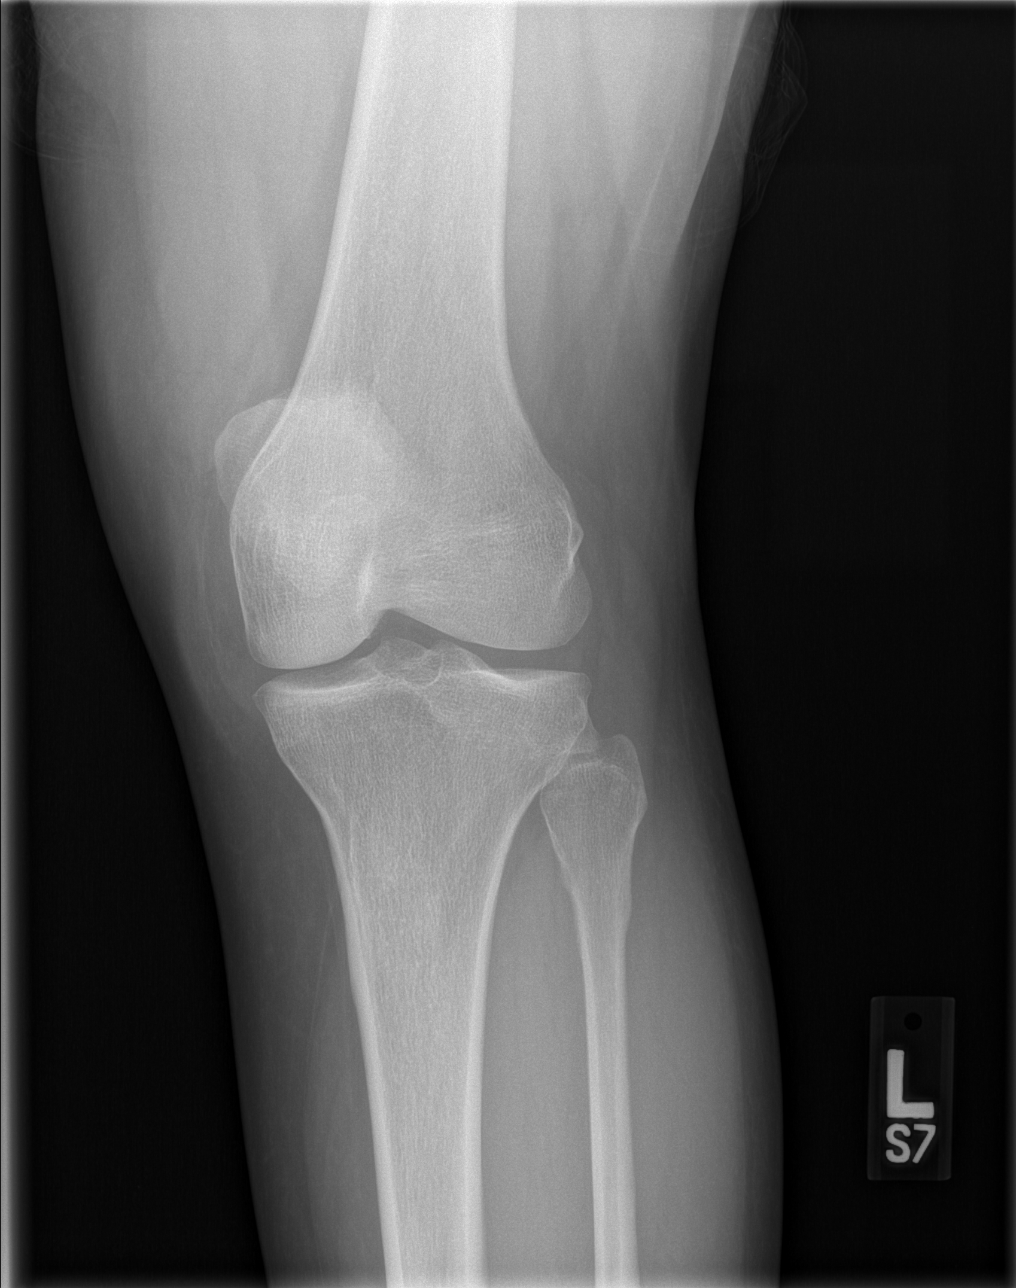

[t knee lat left]
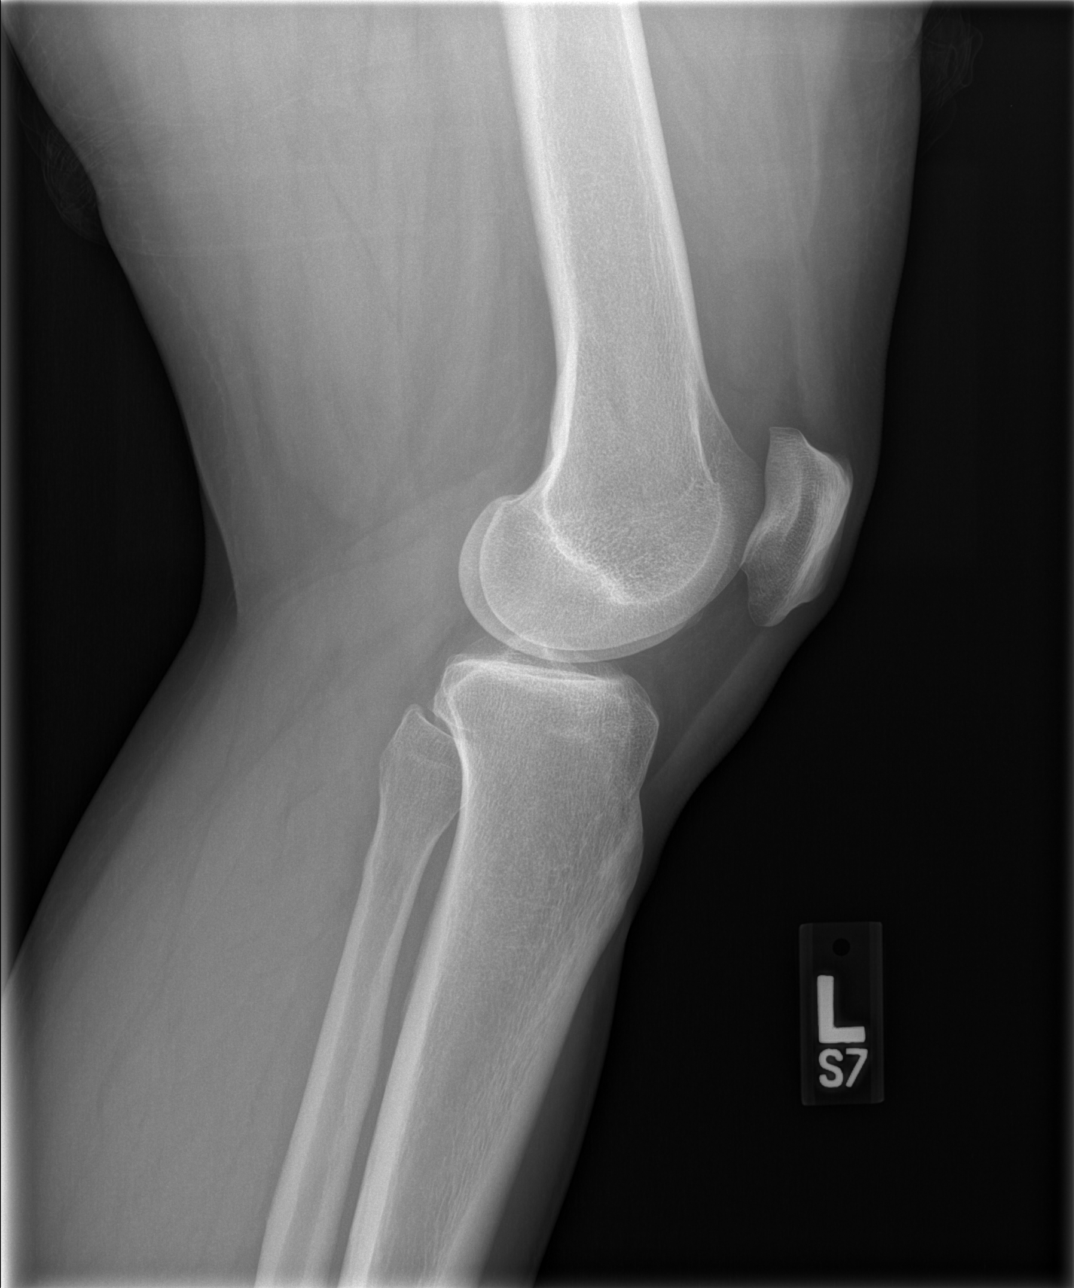

[4 of 4 positions shown; findings below may reference images not displayed]

FINDINGS: No fracture or dislocation.
IMPRESSION: No fracture.

## 2013-03-21 ENCOUNTER — Other Ambulatory Visit: Payer: Self-pay

## 2013-06-10 ENCOUNTER — Ambulatory Visit (INDEPENDENT_AMBULATORY_CARE_PROVIDER_SITE_OTHER): Payer: 59 | Admitting: Family Medicine

## 2013-06-10 ENCOUNTER — Encounter: Payer: Self-pay | Admitting: *Deleted

## 2013-06-10 ENCOUNTER — Encounter: Payer: Self-pay | Admitting: Family Medicine

## 2013-06-10 VITALS — BP 138/76 | HR 83 | Temp 98.5°F | Resp 18 | Wt 236.2 lb

## 2013-06-10 DIAGNOSIS — M545 Low back pain, unspecified: Secondary | ICD-10-CM

## 2013-06-10 HISTORY — DX: Low back pain, unspecified: M54.50

## 2013-06-10 MED ORDER — MELOXICAM 15 MG PO TABS
15.0000 mg | ORAL_TABLET | Freq: Every day | ORAL | Status: DC
Start: 2013-06-10 — End: 2015-02-26

## 2013-06-10 MED ORDER — TRAMADOL HCL 50 MG PO TABS: 50.0000 mg | ORAL_TABLET | Freq: Every evening | ORAL | Status: DC | PRN

## 2013-06-10 MED ORDER — KETOROLAC TROMETHAMINE 60 MG/2ML IM SOLN
60.0000 mg | Freq: Once | INTRAMUSCULAR | Status: AC
  Administered 2013-06-10: 60 mg via INTRAMUSCULAR

## 2013-06-10 MED ORDER — METHYLPREDNISOLONE ACETATE 40 MG/ML IJ SUSP
80.0000 mg | Freq: Once | INTRAMUSCULAR | Status: AC
  Administered 2013-06-10: 80 mg via INTRAMUSCULAR

## 2013-06-10 NOTE — Assessment & Plan Note (Signed)
Patient does have low back pain and does have a past medical history significant for a herniated disc. Patient though does have still good strength and is not having chronic radiculopathy down the left leg she did previously. I would like to try conservative approach. Patient was given Toradol as well as Depo-Medrol today. Patient will try meloxicam as well as tramadol for home use. Patient was given a work note to allow for changing in positions, discussed shoe choices, patient come back in one week for further evaluation. Patient is better but not completely resolve we may want to consider osteopathic manipulation.

## 2013-06-10 NOTE — Patient Instructions (Signed)
Very nice to meet you Two injections today Starting tomorrow Meloxicam daily x 10 days then as needed Tramadol take it at night.  Start exercises daily in 2 days Come back in 1 week.

## 2013-06-10 NOTE — Progress Notes (Signed)
   CC: Back pain  HPI: Patient is a very pleasant 48 year old female coming in with today onset of low back pain. Patient does have a past medical history significant for protruding disc at L5-S1 causing a left-sided L5 nerve root impingement as seen on MRI in 2010 and treated with epidural injection and 2011 with complete resolution. Patient states Saturday when getting out of the car she had a shooting pain in the mid lower back mostly on the left side with mild radiation to the left leg. Patient states the pain is 9/10 he continues to have the shooting pain. Patient denies though any weakness. Patient is able to ambulate. Patient states that it is very similar to the pain she is to have previously. Patient has tried over-the-counter medications with no improvement.  Past medical, surgical, family and social history reviewed. Medications reviewed all in the electronic medical record.   Review of Systems: No headache, visual changes, nausea, vomiting, diarrhea, constipation, dizziness, abdominal pain, skin rash, fevers, chills, night sweats, weight loss, swollen lymph nodes, body aches, joint swelling, muscle aches, chest pain, shortness of breath, mood changes.   Objective:    Blood pressure 138/76, pulse 83, temperature 98.5 F (36.9 C), temperature source Oral, resp. rate 18, weight 236 lb 3.2 oz (107.14 kg), SpO2 97.00%.   General: No apparent distress alert and oriented x3 mood and affect normal, dressed appropriately.  HEENT: Pupils equal, extraocular movements intact Respiratory: Patient's speak in full sentences and does not appear short of breath Cardiovascular: No lower extremity edema, non tender, no erythema Skin: Warm dry intact with no signs of infection or rash on extremities or on axial skeleton. Abdomen: Soft nontender Neuro: Cranial nerves II through XII are intact, neurovascularly intact in all extremities with 2+ DTRs and 2+ pulses. Lymph: No lymphadenopathy of posterior or  anterior cervical chain or axillae bilaterally.  Gait normal with good balance and coordination.  MSK: Non tender with full range of motion and good stability and symmetric strength and tone of shoulders, elbows, wrist, hip, knee and ankles bilaterally.  Back Exam: Patient is obese Inspection: Unremarkable  Motion: Flexion 25 deg, Extension 25 deg, Side Bending to 35 deg bilaterally,  Rotation to 45 deg bilaterally  SLR laying: Negative- positive pain but no radiculopathy XSLR laying: Negative  Palpable tenderness: Severe tenderness over the L5-S1 bilaterally in the paraspinal musculature as well as over the sacral sulcus. No spinous process tenderness.. FABER: negative. Sensory change: Gross sensation intact to all lumbar and sacral dermatomes.  Reflexes: 2+ at both patellar tendons, 2+ at achilles tendons, Babinski's downgoing.  Strength at foot  Plantar-flexion: 5/5 Dorsi-flexion: 5/5 Eversion: 5/5 Inversion: 5/5  Leg strength  Quad: 5/5 Hamstring: 5/5 Hip flexor: 5/5 Hip abductors: 5/5  Gait unremarkable.   Impression and Recommendations:     This case required medical decision making of moderate complexity.

## 2013-06-12 ENCOUNTER — Encounter: Payer: Self-pay | Admitting: Family Medicine

## 2013-06-17 ENCOUNTER — Ambulatory Visit (INDEPENDENT_AMBULATORY_CARE_PROVIDER_SITE_OTHER): Payer: 59 | Admitting: Family Medicine

## 2013-06-17 ENCOUNTER — Encounter: Payer: Self-pay | Admitting: Family Medicine

## 2013-06-17 VITALS — BP 126/82 | HR 90 | Temp 98.4°F | Resp 16 | Wt 238.8 lb

## 2013-06-17 DIAGNOSIS — M545 Low back pain, unspecified: Secondary | ICD-10-CM

## 2013-06-17 NOTE — Progress Notes (Signed)
   CC: Back pain followup  HPI: Patient is a very pleasant 48 year old female coming in followup of low back pain. Patient was given prednisone, anti-inflammatories, as well as home exercises. Patient states that she is 95% better at this time. Patient is able to do all activities today living and denies any radiation down the leg. Patient denies any numbness or weakness. Patient is able to do pretty much everything and only has some discomfort with rotational component such as getting out of her car.     Patient does have a past medical history significant for protruding disc at L5-S1 causing a left-sided L5 nerve root impingement as seen on MRI in 2010 and treated with epidural injection and 2011 with complete resolution.   Past medical, surgical, family and social history reviewed. Medications reviewed all in the electronic medical record.   Review of Systems: No headache, visual changes, nausea, vomiting, diarrhea, constipation, dizziness, abdominal pain, skin rash, fevers, chills, night sweats, weight loss, swollen lymph nodes, body aches, joint swelling, muscle aches, chest pain, shortness of breath, mood changes.   Objective:    Blood pressure 126/82, pulse 90, temperature 98.4 F (36.9 C), temperature source Oral, resp. rate 16, weight 238 lb 12.8 oz (108.319 kg), SpO2 95.00%.   General: No apparent distress alert and oriented x3 mood and affect normal, dressed appropriately.  HEENT: Pupils equal, extraocular movements intact Respiratory: Patient's speak in full sentences and does not appear short of breath Cardiovascular: No lower extremity edema, non tender, no erythema Skin: Warm dry intact with no signs of infection or rash on extremities or on axial skeleton. Abdomen: Soft nontender Neuro: Cranial nerves II through XII are intact, neurovascularly intact in all extremities with 2+ DTRs and 2+ pulses. Lymph: No lymphadenopathy of posterior or anterior cervical chain or axillae  bilaterally.  Gait normal with good balance and coordination.  MSK: Non tender with full range of motion and good stability and symmetric strength and tone of shoulders, elbows, wrist, hip, knee and ankles bilaterally.  Back Exam: Patient is obese Inspection: Unremarkable  Motion: Flexion 45 deg, Extension 45 deg, Side Bending to 35 deg bilaterally,  Rotation to 45 deg bilaterally  SLR laying: Negative- positive pain but no radiculopathy XSLR laying: Negative  Palpable tenderness: Nontender on exam FABER: negative. Sensory change: Gross sensation intact to all lumbar and sacral dermatomes.  Reflexes: 2+ at both patellar tendons, 2+ at achilles tendons, Babinski's downgoing.  Strength at foot  Plantar-flexion: 5/5 Dorsi-flexion: 5/5 Eversion: 5/5 Inversion: 5/5  Leg strength  Quad: 5/5 Hamstring: 5/5 Hip flexor: 5/5 Hip abductors: 5/5  Gait unremarkable.   Impression and Recommendations:     This case required medical decision making of moderate complexity.

## 2013-06-17 NOTE — Progress Notes (Signed)
Pre-visit discussion using our clinic review tool. No additional management support is needed unless otherwise documented below in the visit note.  

## 2013-06-17 NOTE — Assessment & Plan Note (Signed)
Patient's pain seems to be almost completely resolved at this time. Patient was given a home exercise program now strengthening core. We discussed different other her anatomic changes she can make at work they can be beneficial such as computer screen and lumbar back support. Encourage the patient will do very well. Discussed with patient at length about potential osteopathic manipulation. This patient with you that she can come back for followup. Spent greater than 25 minutes with patient face-to-face and had greater than 50% of counseling including as described above in assessment and plan.

## 2013-06-17 NOTE — Patient Instructions (Signed)
You are doing great. Try exercises 3 times a week from now on.  Gym is good but use machines with backs and avoid excessive twisting motions.  Focus on core which includes, glutes, hip abductors, lower back and abs.  Ice 20 minutes after activity can help If you want consider osteo[pathic manipulation with me.  You know where I am .

## 2013-06-19 ENCOUNTER — Encounter: Payer: Self-pay | Admitting: Family Medicine

## 2013-07-17 ENCOUNTER — Encounter: Payer: 59 | Admitting: Internal Medicine

## 2013-07-26 ENCOUNTER — Other Ambulatory Visit: Payer: Self-pay | Admitting: Obstetrics and Gynecology

## 2013-07-26 DIAGNOSIS — Z1231 Encounter for screening mammogram for malignant neoplasm of breast: Secondary | ICD-10-CM

## 2013-08-19 ENCOUNTER — Ambulatory Visit (HOSPITAL_COMMUNITY)
Admission: RE | Admit: 2013-08-19 | Discharge: 2013-08-19 | Disposition: A | Discharge status: Home / Self Care | Payer: 59 | Source: Ambulatory Visit | Attending: Obstetrics and Gynecology | Admitting: Obstetrics and Gynecology

## 2013-08-19 DIAGNOSIS — Z1231 Encounter for screening mammogram for malignant neoplasm of breast: Secondary | ICD-10-CM | POA: Insufficient documentation

## 2013-08-20 LAB — HM MAMMOGRAPHY

## 2014-02-08 ENCOUNTER — Encounter: Payer: Self-pay | Admitting: Internal Medicine

## 2014-02-10 ENCOUNTER — Other Ambulatory Visit: Payer: Self-pay | Admitting: *Deleted

## 2014-02-10 MED ORDER — NAPROXEN 500 MG PO TABS: 500.0000 mg | ORAL_TABLET | Freq: Two times a day (BID) | ORAL | Status: DC

## 2014-02-10 NOTE — Telephone Encounter (Signed)
Sent email requesting refill on her naproxen...Raechel Chute

## 2014-02-20 ENCOUNTER — Other Ambulatory Visit (INDEPENDENT_AMBULATORY_CARE_PROVIDER_SITE_OTHER): Payer: 59

## 2014-02-20 ENCOUNTER — Ambulatory Visit (INDEPENDENT_AMBULATORY_CARE_PROVIDER_SITE_OTHER): Payer: 59 | Admitting: Internal Medicine

## 2014-02-20 ENCOUNTER — Encounter: Payer: 59 | Admitting: Internal Medicine

## 2014-02-20 ENCOUNTER — Encounter: Payer: Self-pay | Admitting: Internal Medicine

## 2014-02-20 VITALS — BP 124/82 | HR 92 | Temp 98.5°F | Ht 64.5 in | Wt 238.5 lb

## 2014-02-20 DIAGNOSIS — R7302 Impaired glucose tolerance (oral): Secondary | ICD-10-CM

## 2014-02-20 DIAGNOSIS — M25475 Effusion, left foot: Secondary | ICD-10-CM

## 2014-02-20 DIAGNOSIS — M25572 Pain in left ankle and joints of left foot: Secondary | ICD-10-CM

## 2014-02-20 DIAGNOSIS — Z0000 Encounter for general adult medical examination without abnormal findings: Secondary | ICD-10-CM

## 2014-02-20 DIAGNOSIS — Z23 Encounter for immunization: Secondary | ICD-10-CM

## 2014-02-20 DIAGNOSIS — M25472 Effusion, left ankle: Secondary | ICD-10-CM

## 2014-02-20 LAB — BASIC METABOLIC PANEL
BUN: 14 mg/dL (ref 6–23)
CHLORIDE: 104 meq/L (ref 96–112)
CO2: 24 mEq/L (ref 19–32)
CREATININE: 0.8 mg/dL (ref 0.4–1.2)
Calcium: 9.5 mg/dL (ref 8.4–10.5)
GFR: 95.25 mL/min (ref >60.00)
Glucose, Bld: 81 mg/dL (ref 70–99)
Potassium: 4.4 mEq/L (ref 3.5–5.1)
Sodium: 139 mEq/L (ref 135–145)

## 2014-02-20 LAB — CBC WITH DIFFERENTIAL/PLATELET
BASOS PCT: 0.8 % (ref 0.0–3.0)
Basophils Absolute: 0.1 10*3/uL (ref 0.0–0.1)
EOS PCT: 3 % (ref 0.0–5.0)
Eosinophils Absolute: 0.2 10*3/uL (ref 0.0–0.7)
HCT: 41.8 % (ref 36.0–46.0)
Hemoglobin: 13.9 g/dL (ref 12.0–15.0)
Lymphocytes Relative: 45.6 % (ref 12.0–46.0)
Lymphs Abs: 3.7 10*3/uL (ref 0.7–4.0)
MCHC: 33.2 g/dL (ref 30.0–36.0)
MCV: 92.1 fl (ref 78.0–100.0)
MONO ABS: 0.6 10*3/uL (ref 0.1–1.0)
MONOS PCT: 7.1 % (ref 3.0–12.0)
NEUTROS PCT: 43.5 % (ref 43.0–77.0)
Neutro Abs: 3.5 10*3/uL (ref 1.4–7.7)
Platelets: 262 10*3/uL (ref 150.0–400.0)
RBC: 4.54 Mil/uL (ref 3.87–5.11)
RDW: 13.2 % (ref 11.5–15.5)
WBC: 8.1 10*3/uL (ref 4.0–10.5)

## 2014-02-20 LAB — URINALYSIS, ROUTINE W REFLEX MICROSCOPIC
BILIRUBIN URINE: NEGATIVE
Hgb urine dipstick: NEGATIVE
LEUKOCYTES UA: NEGATIVE
NITRITE: NEGATIVE
PH: 5.5 (ref 5.0–8.0)
SPECIFIC GRAVITY, URINE: 1.025 (ref 1.000–1.030)
TOTAL PROTEIN, URINE-UPE24: NEGATIVE
Urine Glucose: NEGATIVE
Urobilinogen, UA: 0.2 (ref 0.0–1.0)

## 2014-02-20 LAB — HEPATIC FUNCTION PANEL
ALBUMIN: 4.1 g/dL (ref 3.5–5.2)
ALT: 20 U/L (ref 0–35)
AST: 16 U/L (ref 0–37)
Alkaline Phosphatase: 53 U/L (ref 39–117)
BILIRUBIN TOTAL: 0.5 mg/dL (ref 0.2–1.2)
Bilirubin, Direct: 0.1 mg/dL (ref 0.0–0.3)
Total Protein: 8.1 g/dL (ref 6.0–8.3)

## 2014-02-20 LAB — LIPID PANEL
CHOL/HDL RATIO: 6
Cholesterol: 213 mg/dL — ABNORMAL HIGH (ref 0–200)
HDL: 38.5 mg/dL — AB (ref >39.00)
LDL CALC: 160 mg/dL — AB (ref 0–99)
NonHDL: 174.5
TRIGLYCERIDES: 72 mg/dL (ref 0.0–149.0)
VLDL: 14.4 mg/dL (ref 0.0–40.0)

## 2014-02-20 LAB — TSH: TSH: 3.49 u[IU]/mL (ref 0.35–4.50)

## 2014-02-20 MED ORDER — NAPROXEN 500 MG PO TABS: 500.0000 mg | ORAL_TABLET | Freq: Two times a day (BID) | ORAL | Status: DC

## 2014-02-20 NOTE — Progress Notes (Signed)
Pre visit review using our clinic review tool, if applicable. No additional management support is needed unless otherwise documented below in the visit note. 

## 2014-02-20 NOTE — Assessment & Plan Note (Signed)

## 2014-02-20 NOTE — Patient Instructions (Addendum)
You had the flu shot today  Your EKG was OK today  Please continue all other medications as before, and refills have been done if requested.  Please have the pharmacy call with any other refills you may need.  Please continue your efforts at being more active, low cholesterol diet, and weight control.  You are otherwise up to date with prevention measures today.  Please keep your appointments with your specialists as you may have planned  Please go to the LAB in the Basement (turn left off the elevator) for the tests to be done today  You will be contacted by phone if any changes need to be made immediately.  Otherwise, you will receive a letter about your results with an explanation, but please check with MyChart first.  Please return in 1 year for your yearly visit, or sooner if needed, with Lab testing done 3-5 days before

## 2014-02-20 NOTE — Progress Notes (Signed)
Subjective:    Patient ID: Lauren Fox, female    DOB: 08-26-1965, 48 y.o.   MRN: [id number]  HPI  Here for wellness and f/u;  Overall doing ok;  Pt denies CP, worsening SOB, DOE, wheezing, orthopnea, PND, worsening LE edema, palpitations, dizziness or syncope.  Pt denies neurological change such as new headache, facial or extremity weakness.  Pt denies polydipsia, polyuria, or low sugar symptoms. Pt states overall good compliance with treatment and medications, good tolerability, and has been trying to follow lower cholesterol diet.  Pt denies worsening depressive symptoms, suicidal ideation or panic. No fever, night sweats, wt loss, loss of appetite, or other constitutional symptoms.  Pt states good ability with ADL's, has low fall risk, home safety reviewed and adequate, no other significant changes in hearing or vision, and only occasionally active with exercise, such as zumba occas due to back pain. Pt continues to have recurring LBP without change in severity, bowel or bladder change, fever, wt loss,  worsening LE pain/numbness/weakness, gait change or falls.  Has seen Dr Smith/sport med Today also with new swelling/pain to the left anteromedial ankle area x 2 wks, with swelling and tenderness, just not improving. Past Medical History  Diagnosis Date  . GLUCOSE INTOLERANCE 11/25/2008  . HYPERLIPIDEMIA 11/25/2008  . Gout, unspecified 11/25/2008  . Morbid obesity 11/25/2008  . ALLERGIC RHINITIS 11/25/2008  . Unspecified backache 12/17/2009  . FOOT PAIN, LEFT 12/19/2009  . DIZZINESS 11/25/2008  . Headache(784.0) 06/22/2010  . Impaired glucose tolerance 08/24/2010  . Lumbar disc disease 02/21/2012   Past Surgical History  Procedure Laterality Date  . Tubal ligation    . Abdominal hysterectomy  20063    non malignant  . S/p pilonidal cyst      reports that she has been smoking Cigarettes.  She has been smoking about 0.00 packs per day. She does not have any smokeless tobacco history on file. She  reports that she drinks alcohol. She reports that she does not use illicit drugs. family history includes Coronary artery disease in her other; Diabetes in her father, other, and other; Glaucoma in her father; Heart attack in her other; Hyperlipidemia in her mother; Hypertension in her father and mother. No Known Allergies Current Outpatient Prescriptions on File Prior to Visit  Medication Sig Dispense Refill  . meloxicam (MOBIC) 15 MG tablet Take 1 tablet (15 mg total) by mouth daily.  30 tablet  0   No current facility-administered medications on file prior to visit.     Review of Systems Constitutional: Negative for increased diaphoresis, other activity, appetite or other siginficant weight change  HENT: Negative for worsening hearing loss, ear pain, facial swelling, mouth sores and neck stiffness.   Eyes: Negative for other worsening pain, redness or visual disturbance.  Respiratory: Negative for shortness of breath and wheezing.   Cardiovascular: Negative for chest pain and palpitations.  Gastrointestinal: Negative for diarrhea, blood in stool, abdominal distention or other pain Genitourinary: Negative for hematuria, flank pain or change in urine volume.  Musculoskeletal: Negative for myalgias or other joint complaints.  Skin: Negative for color change and wound.  Neurological: Negative for syncope and numbness. other than noted Hematological: Negative for adenopathy. or other swelling Psychiatric/Behavioral: Negative for hallucinations, self-injury, decreased concentration or other worsening agitation.      Objective:   Physical Exam BP 124/82  Pulse 92  Temp(Src) 98.5 F (36.9 C) (Oral)  Ht 5' 4.5" (1.638 m)  Wt 238 lb 8 oz (108.183 kg)  BMI 40.32 kg/m2  SpO2 94% VS noted,  Constitutional: Pt is oriented to person, place, and time. Appears well-developed and well-nourished.  Head: Normocephalic and atraumatic.  Right Ear: External ear normal.  Left Ear: External ear  normal.  Nose: Nose normal.  Mouth/Throat: Oropharynx is clear and moist.  Eyes: Conjunctivae and EOM are normal. Pupils are equal, round, and reactive to light.  Neck: Normal range of motion. Neck supple. No JVD present. No tracheal deviation present.  Cardiovascular: Normal rate, regular rhythm, normal heart sounds and intact distal pulses.   Pulmonary/Chest: Effort normal and breath sounds without rales or wheezing  Abdominal: Soft. Bowel sounds are normal. NT. No HSM  Musculoskeletal: Normal range of motion. Exhibits no edema.  Lymphadenopathy:  Has no cervical adenopathy.  Neurological: Pt is alert and oriented to person, place, and time. Pt has normal reflexes. No cranial nerve deficit. Motor grossly intact Skin: Skin is warm and dry. No rash noted.  Psychiatric:  Has normal mood and affect. Behavior is normal.  Left anteroimedial ankle with tender/swelling primarily at ankle level, with some swelling noted to prox foot, but more to mid leg as well Wt Readings from Last 3 Encounters:  02/20/14 238 lb 8 oz (108.183 kg)  06/17/13 238 lb 12.8 oz (108.319 kg)  06/10/13 236 lb 3.2 oz (107.14 kg)       Assessment & Plan:

## 2014-02-21 ENCOUNTER — Other Ambulatory Visit: Payer: Self-pay | Admitting: Internal Medicine

## 2014-02-21 ENCOUNTER — Telehealth: Payer: Self-pay | Admitting: Internal Medicine

## 2014-02-21 MED ORDER — ATORVASTATIN CALCIUM 10 MG PO TABS: 10.0000 mg | ORAL_TABLET | Freq: Every day | ORAL | Status: DC

## 2014-02-21 NOTE — Telephone Encounter (Signed)
emmi emailed °

## 2014-02-23 DIAGNOSIS — M25579 Pain in unspecified ankle and joints of unspecified foot: Secondary | ICD-10-CM | POA: Insufficient documentation

## 2014-02-23 DIAGNOSIS — M25473 Effusion, unspecified ankle: Secondary | ICD-10-CM | POA: Insufficient documentation

## 2014-02-23 NOTE — Assessment & Plan Note (Signed)
stable overall by history and exam, recent data reviewed with pt, and pt to continue medical treatment as before,  to f/u any worsening symptoms or concerns Lab Results  Component Value Date   HGBA1C 5.5 02/15/2012

## 2014-02-23 NOTE — Assessment & Plan Note (Addendum)
C/w tendonitis, for nsaid /tylenol prn, to consider referral Dr Katrinka BlazingSmith for this as well

## 2014-06-23 ENCOUNTER — Ambulatory Visit (INDEPENDENT_AMBULATORY_CARE_PROVIDER_SITE_OTHER): Payer: 59 | Admitting: Internal Medicine

## 2014-06-23 ENCOUNTER — Encounter: Payer: Self-pay | Admitting: Internal Medicine

## 2014-06-23 VITALS — BP 144/88 | HR 70 | Temp 98.3°F | Ht 65.0 in | Wt 238.0 lb

## 2014-06-23 DIAGNOSIS — M549 Dorsalgia, unspecified: Secondary | ICD-10-CM

## 2014-06-23 DIAGNOSIS — M546 Pain in thoracic spine: Secondary | ICD-10-CM

## 2014-06-23 MED ORDER — TRAMADOL HCL 50 MG PO TABS
50.0000 mg | ORAL_TABLET | Freq: Three times a day (TID) | ORAL | Status: DC | PRN
Start: 2014-06-23 — End: 2015-02-26

## 2014-06-23 MED ORDER — CYCLOBENZAPRINE HCL 5 MG PO TABS: ORAL_TABLET | ORAL | Status: DC

## 2014-06-23 NOTE — Progress Notes (Signed)
Pre visit review using our clinic review tool, if applicable. No additional management support is needed unless otherwise documented below in the visit note. 

## 2014-06-23 NOTE — Progress Notes (Signed)
   Subjective:    Patient ID: Lauren Fox, female    DOB: 10/14/1965, 49 y.o.   MRN: [id number]  HPI   Symptoms began last night when she stood up suddenly & experienced pain in the right flank which radiated anteriorly to the upper quadrant and midline areas. It's varied in intensity from sharp to dull. Naproxen was of minimal benefit.  She has meloxicam on hand which she is not using at this time.  She has a history of lumbar degenerative disc disease with rupture. She's had an epidural steroid injection.  She denies any constitutional or neuromuscular symptoms otherwise.  Review of Systems  She specifically denies fever, chills, sweats, or associated rash.  She has no dysuria, pyuria, hematuria  There has been no melena or rectal bleeding.  She has no incontinence of urine or stool. She does describe bloating since the onset of symptoms. Intermittently  she's had loose stools.  She had a gynecologic exam last month which was negative for ovarian issues.    Objective:   Physical Exam  Positive or pertinent findings include:  As per CDC Guidelines ,Epic documents obesity as being present . She is tender to palpation and percussion over the right posterior flank. Gait including heel and toe walking is normal. Deep tendon reflexes, strength, and tone are normal. She is able to lie flat and sit up without help. Straight leg raising is negative.  General appearance :adequately nourished; in no distress. Eyes: No conjunctival inflammation or scleral icterus is present. Heart:  Normal rate and regular rhythm. S1 and S2 normal without gallop, murmur, click, rub or other extra sounds   Lungs:Chest clear to auscultation; no wheezes, rhonchi,rales ,or rubs present.No increased work of breathing.  Abdomen: bowel sounds normal, soft and non-tender without masses, organomegaly or hernias noted.  No guarding or rebound.  Vascular : all pulses equal ; no bruits present. Skin:Warm &  dry.  Intact without suspicious lesions or rashes ; no  tenting Lymphatic: No lymphadenopathy is noted about the head, neck, axilla        Assessment & Plan:  #1 acute thoracic back pain  Plan: See orders recommendations

## 2014-06-23 NOTE — Patient Instructions (Signed)
Use an anti-inflammatory cream such as Aspercreme or Zostrix cream twice a day to the affected area as needed. In lieu of this warm moist compresses or  hot water bottle can be used. Do not apply ice .  Please take a probiotic , Florastor OR Align, every day if having bloating or if the bowels are loose. This will replace the normal bacteria which  are necessary for formation of normal stool and processing of food.

## 2014-07-01 ENCOUNTER — Encounter: Payer: Self-pay | Admitting: Internal Medicine

## 2014-07-07 ENCOUNTER — Encounter: Payer: Self-pay | Admitting: Internal Medicine

## 2014-07-08 MED ORDER — FLUTICASONE PROPIONATE 50 MCG/ACT NA SUSP: 2.0000 | Freq: Every day | NASAL | Status: DC

## 2014-08-05 ENCOUNTER — Other Ambulatory Visit (HOSPITAL_COMMUNITY): Payer: Self-pay | Admitting: Obstetrics and Gynecology

## 2014-08-05 DIAGNOSIS — Z1231 Encounter for screening mammogram for malignant neoplasm of breast: Secondary | ICD-10-CM

## 2014-08-21 ENCOUNTER — Ambulatory Visit (HOSPITAL_COMMUNITY)
Admission: RE | Admit: 2014-08-21 | Discharge: 2014-08-21 | Disposition: A | Discharge status: Home / Self Care | Payer: 59 | Source: Ambulatory Visit | Attending: Obstetrics and Gynecology | Admitting: Obstetrics and Gynecology

## 2014-08-21 DIAGNOSIS — Z1231 Encounter for screening mammogram for malignant neoplasm of breast: Secondary | ICD-10-CM | POA: Diagnosis not present

## 2014-08-22 ENCOUNTER — Other Ambulatory Visit: Payer: Self-pay | Admitting: Obstetrics and Gynecology

## 2014-08-22 DIAGNOSIS — R928 Other abnormal and inconclusive findings on diagnostic imaging of breast: Secondary | ICD-10-CM

## 2014-09-03 ENCOUNTER — Other Ambulatory Visit: Payer: 59

## 2014-09-04 ENCOUNTER — Ambulatory Visit
Admission: RE | Admit: 2014-09-04 | Discharge: 2014-09-04 | Disposition: A | Discharge status: Home / Self Care | Payer: 59 | Source: Ambulatory Visit | Attending: Obstetrics and Gynecology | Admitting: Obstetrics and Gynecology

## 2014-09-04 DIAGNOSIS — R928 Other abnormal and inconclusive findings on diagnostic imaging of breast: Secondary | ICD-10-CM

## 2014-10-21 ENCOUNTER — Ambulatory Visit (INDEPENDENT_AMBULATORY_CARE_PROVIDER_SITE_OTHER): Payer: 59 | Admitting: Internal Medicine

## 2014-10-21 ENCOUNTER — Encounter: Payer: Self-pay | Admitting: Internal Medicine

## 2014-10-21 VITALS — BP 122/80 | HR 74 | Temp 98.2°F | Wt 237.0 lb

## 2014-10-21 DIAGNOSIS — J309 Allergic rhinitis, unspecified: Secondary | ICD-10-CM | POA: Diagnosis not present

## 2014-10-21 DIAGNOSIS — R42 Dizziness and giddiness: Secondary | ICD-10-CM

## 2014-10-21 DIAGNOSIS — R7302 Impaired glucose tolerance (oral): Secondary | ICD-10-CM

## 2014-10-21 MED ORDER — MECLIZINE HCL 25 MG PO TABS
25.0000 mg | ORAL_TABLET | Freq: Three times a day (TID) | ORAL | Status: DC | PRN
Start: 2014-10-21 — End: 2017-09-22

## 2014-10-21 NOTE — Progress Notes (Signed)
Subjective:    Patient ID: Lauren Fox, female    DOB: 08/16/1965, 49 y.o.   MRN: [id number]  HPI  Here with second lifetime episode pf vertigo ofr several hours, better with lyging down, worse today, Pt denies new neurological symptoms such as new headache, or facial or extremity weakness or numbness   + Recent ? URi and allergy symtpoms  Pt denies chest pain, increased sob or doe, wheezing, orthopnea, PND, increased LE swelling, palpitations, dizziness or syncope.   Pt denies polydipsia, polyuria.  Pt denies fever, wt loss, night sweats, loss of appetite, or other constitutional symptoms  Does have several wks ongoing nasal allergy symptoms with clearish congestion, itch and sneezing, without fever, pain, ST, cough, swelling or wheezing., not taking her allergy meds recently Past Medical History  Diagnosis Date  . GLUCOSE INTOLERANCE 11/25/2008  . HYPERLIPIDEMIA 11/25/2008  . Gout, unspecified 11/25/2008  . Morbid obesity 11/25/2008  . ALLERGIC RHINITIS 11/25/2008  . Unspecified backache 12/17/2009  . FOOT PAIN, LEFT 12/19/2009  . DIZZINESS 11/25/2008  . Headache(784.0) 06/22/2010  . Impaired glucose tolerance 08/24/2010  . Lumbar disc disease 02/21/2012   Past Surgical History  Procedure Laterality Date  . Tubal ligation    . Abdominal hysterectomy  20063    non malignant  . S/p pilonidal cyst      reports that she quit smoking about 2 years ago. Her smoking use included Cigarettes. She does not have any smokeless tobacco history on file. She reports that she drinks alcohol. She reports that she does not use illicit drugs. family history includes Coronary artery disease in her other; Diabetes in her father, other, and other; Glaucoma in her father; Heart attack in her other; Hyperlipidemia in her mother; Hypertension in her father and mother. No Known Allergies Current Outpatient Prescriptions on File Prior to Visit  Medication Sig Dispense Refill  . atorvastatin (LIPITOR) 10 MG tablet  Take 1 tablet (10 mg total) by mouth daily. 90 tablet 3  . cyclobenzaprine (FLEXERIL) 5 MG tablet 1-2 qhs prn (Patient not taking: Reported on 10/24/2014) 14 tablet 0  . fluticasone (FLONASE) 50 MCG/ACT nasal spray Place 2 sprays into both nostrils daily. 16 g 2  . meloxicam (MOBIC) 15 MG tablet Take 1 tablet (15 mg total) by mouth daily. (Patient not taking: Reported on 10/24/2014) 30 tablet 0  . naproxen (NAPROSYN) 500 MG tablet Take 1 tablet (500 mg total) by mouth 2 (two) times daily with a meal. As needed 60 tablet 11  . traMADol (ULTRAM) 50 MG tablet Take 1 tablet (50 mg total) by mouth every 8 (eight) hours as needed. (Patient not taking: Reported on 10/24/2014) 30 tablet 0   No current facility-administered medications on file prior to visit.   Review of Systems  Constitutional: Negative for unusual diaphoresis or night sweats HENT: Negative for ringing in ear or discharge Eyes: Negative for double vision or worsening visual disturbance.  Respiratory: Negative for choking and stridor.   Gastrointestinal: Negative for vomiting or other signifcant bowel change Genitourinary: Negative for hematuria or change in urine volume.  Musculoskeletal: Negative for other MSK pain or swelling Skin: Negative for color change and worsening wound.  Neurological: Negative for tremors and numbness other than noted  Psychiatric/Behavioral: Negative for decreased concentration or agitation other than above       Objective:   Physical Exam BP 122/80 mmHg  Pulse 74  Temp(Src) 98.2 F (36.8 C) (Oral)  Wt 237 lb (107.502 kg)  SpO2  98% VS noted,  Constitutional: Pt appears in no significant distress HENT: Head: NCAT.  Right Ear: External ear normal.  Left Ear: External ear normal.  Eyes: . Pupils are equal, round, and reactive to light. Conjunctivae and EOM are normal Neck: Normal range of motion. Neck supple.  Cardiovascular: Normal rate and regular rhythm.   Pulmonary/Chest: Effort normal and  breath sounds without rales or wheezing.  Bilat tm's with mild erythema.  Max sinus areas non tender.  Pharynx with mild erythema, no exudate Neurological: Pt is alert. Not confused , motor grossly intact Skin: Skin is warm. No rash, no LE edema Psychiatric: Pt behavior is normal. No agitation.     Assessment & Plan:

## 2014-10-21 NOTE — Progress Notes (Signed)
Pre visit review using our clinic review tool, if applicable. No additional management support is needed unless otherwise documented below in the visit note. 

## 2014-10-21 NOTE — Patient Instructions (Signed)
Please take all new medication as prescribed - the antivert  OK to re-start your allergy meds  Please continue all other medications as before, and refills have been done if requested.  Please have the pharmacy call with any other refills you may need.  Please continue your efforts at being more active, low cholesterol diet, and weight control.  Please keep your appointments with your specialists as you may have planned

## 2014-10-24 ENCOUNTER — Emergency Department (HOSPITAL_COMMUNITY)
Admission: EM | Admit: 2014-10-24 | Discharge: 2014-10-24 | Disposition: A | Discharge status: Home / Self Care | Payer: 59 | Attending: Emergency Medicine | Admitting: Emergency Medicine

## 2014-10-24 ENCOUNTER — Encounter (HOSPITAL_COMMUNITY): Payer: Self-pay

## 2014-10-24 DIAGNOSIS — Z87891 Personal history of nicotine dependence: Secondary | ICD-10-CM | POA: Diagnosis not present

## 2014-10-24 DIAGNOSIS — Z7951 Long term (current) use of inhaled steroids: Secondary | ICD-10-CM | POA: Diagnosis not present

## 2014-10-24 DIAGNOSIS — Z8739 Personal history of other diseases of the musculoskeletal system and connective tissue: Secondary | ICD-10-CM | POA: Insufficient documentation

## 2014-10-24 DIAGNOSIS — R002 Palpitations: Secondary | ICD-10-CM | POA: Insufficient documentation

## 2014-10-24 DIAGNOSIS — Z79899 Other long term (current) drug therapy: Secondary | ICD-10-CM | POA: Diagnosis not present

## 2014-10-24 DIAGNOSIS — E785 Hyperlipidemia, unspecified: Secondary | ICD-10-CM | POA: Insufficient documentation

## 2014-10-24 DIAGNOSIS — R008 Other abnormalities of heart beat: Secondary | ICD-10-CM | POA: Diagnosis present

## 2014-10-24 LAB — CBC WITH DIFFERENTIAL/PLATELET
BASOS ABS: 0 10*3/uL (ref 0.0–0.1)
Basophils Relative: 0 % (ref 0–1)
Eosinophils Absolute: 0.2 10*3/uL (ref 0.0–0.7)
Eosinophils Relative: 3 % (ref 0–5)
HCT: 40.4 % (ref 36.0–46.0)
Hemoglobin: 13.6 g/dL (ref 12.0–15.0)
LYMPHS PCT: 44 % (ref 12–46)
Lymphs Abs: 3 10*3/uL (ref 0.7–4.0)
MCH: 30.6 pg (ref 26.0–34.0)
MCHC: 33.7 g/dL (ref 30.0–36.0)
MCV: 90.8 fL (ref 78.0–100.0)
Monocytes Absolute: 0.5 10*3/uL (ref 0.1–1.0)
Monocytes Relative: 7 % (ref 3–12)
NEUTROS ABS: 3.2 10*3/uL (ref 1.7–7.7)
NEUTROS PCT: 46 % (ref 43–77)
PLATELETS: 263 10*3/uL (ref 150–400)
RBC: 4.45 MIL/uL (ref 3.87–5.11)
RDW: 12.9 % (ref 11.5–15.5)
WBC: 6.9 10*3/uL (ref 4.0–10.5)

## 2014-10-24 LAB — I-STAT CHEM 8, ED
BUN: 12 mg/dL (ref 6–20)
CALCIUM ION: 1.14 mmol/L (ref 1.12–1.23)
CREATININE: 1 mg/dL (ref 0.44–1.00)
Chloride: 104 mmol/L (ref 101–111)
Glucose, Bld: 110 mg/dL — ABNORMAL HIGH (ref 65–99)
HEMATOCRIT: 44 % (ref 36.0–46.0)
Hemoglobin: 15 g/dL (ref 12.0–15.0)
Potassium: 3.9 mmol/L (ref 3.5–5.1)
SODIUM: 142 mmol/L (ref 135–145)
TCO2: 20 mmol/L (ref 0–100)

## 2014-10-24 LAB — I-STAT TROPONIN, ED: Troponin i, poc: 0 ng/mL (ref 0.00–0.08)

## 2014-10-24 NOTE — Discharge Instructions (Signed)
Tonight your heart rate was regular throughout the ED visit, your EKG was normal sinus rhythm.  Your electrolytes were checked.  These were all within normal limits.  As discussed please try to limit her caffeine over the next several days to see if this improves your feeling of palpitations.  Make an appointment with your primary care physician for further evaluation

## 2014-10-24 NOTE — ED Provider Notes (Signed)
CSN: [id number]     Arrival date & time 10/24/14  0036 History   First MD Initiated Contact with Patient 10/24/14 0130     Chief Complaint  Patient presents with  . Irregular Heart Beat     (Consider location/radiation/quality/duration/timing/severity/associated sxs/prior Treatment) HPI Comments: This is a 49 year old female with a history of irregular heartbeats, who has been seen by her primary care physician in the past for same.  She states that normally resets itself.  After short period of time, but today it is been more persistent and she's felt dizzy.  She denies any nausea, vomiting, diarrhea, but does state that she saw her primary care physician several days ago for vertigo.  She was given a prescription for neck losing she's taken one dose yesterday morning, none since then.  She does report that she drinks a lot of caffeinated beverages.  Since arrival to the emergency department.  She says she's felt one or 2 episodes the been short lived.  Denies any visual disturbances, headache  The history is provided by the patient.    Past Medical History  Diagnosis Date  . GLUCOSE INTOLERANCE 11/25/2008  . HYPERLIPIDEMIA 11/25/2008  . Gout, unspecified 11/25/2008  . Morbid obesity 11/25/2008  . ALLERGIC RHINITIS 11/25/2008  . Unspecified backache 12/17/2009  . FOOT PAIN, LEFT 12/19/2009  . DIZZINESS 11/25/2008  . Headache(784.0) 06/22/2010  . Impaired glucose tolerance 08/24/2010  . Lumbar disc disease 02/21/2012   Past Surgical History  Procedure Laterality Date  . Tubal ligation    . Abdominal hysterectomy  20063    non malignant  . S/p pilonidal cyst     Family History  Problem Relation Age of Onset  . Hyperlipidemia Mother   . Hypertension Mother   . Glaucoma Father   . Hypertension Father   . Diabetes Father   . Diabetes Other   . Heart attack Other   . Coronary artery disease Other   . Diabetes Other    History  Substance Use Topics  . Smoking status: Former Smoker   Types: Cigarettes    Quit date: 05/16/2012  . Smokeless tobacco: Not on file  . Alcohol Use: 0.0 oz/week    0 Standard drinks or equivalent per week     Comment: social   OB History    No data available     Review of Systems  Constitutional: Negative for fever.  HENT: Negative for congestion.   Eyes: Negative for visual disturbance.  Cardiovascular: Negative for chest pain and leg swelling.  Gastrointestinal: Negative for nausea, vomiting, abdominal pain and diarrhea.  Genitourinary: Negative for dysuria.  Skin: Negative for rash.  Neurological: Negative for dizziness and headaches.  All other systems reviewed and are negative.     Allergies  Review of patient's allergies indicates no known allergies.  Home Medications   Prior to Admission medications   Medication Sig Start Date End Date Taking? Authorizing Provider  atorvastatin (LIPITOR) 10 MG tablet Take 1 tablet (10 mg total) by mouth daily. 02/21/14  Yes Corwin Levins, MD  fluticasone (FLONASE) 50 MCG/ACT nasal spray Place 2 sprays into both nostrils daily. 07/08/14  Yes Corwin Levins, MD  meclizine (ANTIVERT) 25 MG tablet Take 1 tablet (25 mg total) by mouth 3 (three) times daily as needed for dizziness. 10/21/14  Yes Corwin Levins, MD  naproxen (NAPROSYN) 500 MG tablet Take 1 tablet (500 mg total) by mouth 2 (two) times daily with a meal. As needed 02/20/14  Yes Corwin Levins, MD  cyclobenzaprine (FLEXERIL) 5 MG tablet 1-2 qhs prn Patient not taking: Reported on 10/24/2014 06/23/14   Pecola Lawless, MD  meloxicam (MOBIC) 15 MG tablet Take 1 tablet (15 mg total) by mouth daily. Patient not taking: Reported on 10/24/2014 06/10/13   Judi Saa, DO  traMADol (ULTRAM) 50 MG tablet Take 1 tablet (50 mg total) by mouth every 8 (eight) hours as needed. Patient not taking: Reported on 10/24/2014 06/23/14   Pecola Lawless, MD   BP 119/67 mmHg  Pulse 67  Temp(Src) 98.1 F (36.7 C) (Oral)  Resp 10  Ht  (1.651 m)  Wt 237 lb  (107.502 kg)  BMI 39.44 kg/m2  SpO2 100% Physical Exam  Constitutional: She is oriented to person, place, and time. She appears well-developed and well-nourished.  HENT:  Head: Normocephalic.  Eyes: Pupils are equal, round, and reactive to light.  Neck: Normal range of motion.  Cardiovascular: Normal rate and regular rhythm.   EKG is NSR and Monitor shows regular rythm  Pulmonary/Chest: Effort normal.  Abdominal: Soft.  Musculoskeletal: Normal range of motion.  Neurological: She is alert and oriented to person, place, and time.  Skin: Skin is warm and dry.  Nursing note and vitals reviewed.   ED Course  Procedures (including critical care time) Labs Review Labs Reviewed  I-STAT CHEM 8, ED - Abnormal; Notable for the following:    Glucose, Bld 110 (*)    All other components within normal limits  CBC WITH DIFFERENTIAL/PLATELET  I-STAT TROPOININ, ED    Imaging Review No results found.   EKG Interpretation   Date/Time:  Friday October 24 2014 00:55:34 EDT Ventricular Rate:  71 PR Interval:  138 QRS Duration: 74 QT Interval:  404 QTC Calculation: 439 R Axis:   15 Text Interpretation:  Sinus rhythm Confirmed by Wilkie Aye  MD, COURTNEY  ([id number]) on 10/24/2014 1:49:39 AM      MDM   Final diagnoses:  Palpitations         Earley Favor, NP 10/24/14 1610  Shon Baton, MD 10/24/14 [id number]

## 2014-10-24 NOTE — ED Notes (Signed)
Pt complains of an irregular heartbeat and dizziness since yesterday, she has had this before and it usually fixes itself, this time it kept waking her up and she was scared.

## 2014-10-24 NOTE — ED Notes (Addendum)
Family at bedside. Pt resting 

## 2014-10-25 DIAGNOSIS — R42 Dizziness and giddiness: Secondary | ICD-10-CM | POA: Insufficient documentation

## 2014-10-25 NOTE — Assessment & Plan Note (Signed)
Asympt, stable overall by history and exam, recent data reviewed with pt, and pt to continue medical treatment as before,  to f/u any worsening symptoms or concerns Lab Results  Component Value Date   HGBA1C 5.5 02/15/2012   Declines lab today, for f/u with next draw

## 2014-10-25 NOTE — Assessment & Plan Note (Signed)
Mild to mod, for antivert asd,  to f/u any worsening symptoms or concerns

## 2014-10-25 NOTE — Assessment & Plan Note (Signed)
Pt to re-start home meds,  to f/u any worsening symptoms or concerns

## 2014-12-31 ENCOUNTER — Ambulatory Visit (INDEPENDENT_AMBULATORY_CARE_PROVIDER_SITE_OTHER): Payer: 59 | Admitting: Internal Medicine

## 2014-12-31 ENCOUNTER — Encounter: Payer: Self-pay | Admitting: Internal Medicine

## 2014-12-31 VITALS — BP 122/86 | HR 68 | Temp 98.3°F | Ht 64.0 in | Wt 235.0 lb

## 2014-12-31 DIAGNOSIS — R7302 Impaired glucose tolerance (oral): Secondary | ICD-10-CM | POA: Diagnosis not present

## 2014-12-31 DIAGNOSIS — J019 Acute sinusitis, unspecified: Secondary | ICD-10-CM | POA: Diagnosis not present

## 2014-12-31 MED ORDER — HYDROCODONE-HOMATROPINE 5-1.5 MG/5ML PO SYRP
5.0000 mL | ORAL_SOLUTION | Freq: Four times a day (QID) | ORAL | Status: DC | PRN
Start: 2014-12-31 — End: 2015-02-26

## 2014-12-31 MED ORDER — LEVOFLOXACIN 250 MG PO TABS: 250.0000 mg | ORAL_TABLET | Freq: Every day | ORAL | Status: DC

## 2014-12-31 NOTE — Progress Notes (Signed)
Pre visit review using our clinic review tool, if applicable. No additional management support is needed unless otherwise documented below in the visit note. 

## 2014-12-31 NOTE — Assessment & Plan Note (Signed)
stable overall by history and exam, recent data reviewed with pt, and pt to continue medical treatment as before,  to f/u any worsening symptoms or concerns Lab Results  Component Value Date   HGBA1C 5.5 02/15/2012    

## 2014-12-31 NOTE — Progress Notes (Signed)
Subjective:    Patient ID: Lauren Fox, female    DOB: [birth year], 49 y.o.   MRN: [id number]  HPI   Here with 2-3 days acute onset fever, facial pain, pressure, headache, general weakness and malaise, and greenish d/c, with mild ST and cough, but pt denies chest pain, wheezing, increased sob or doe, orthopnea, PND, increased LE swelling, palpitations, dizziness or syncope.  Pt denies new neurological symptoms such as new headache, or facial or extremity weakness or numbness   Pt denies polydipsia, polyuria, Past Medical History  Diagnosis Date  . GLUCOSE INTOLERANCE 11/25/2008  . HYPERLIPIDEMIA 11/25/2008  . Gout, unspecified 11/25/2008  . Morbid obesity 11/25/2008  . ALLERGIC RHINITIS 11/25/2008  . Unspecified backache 12/17/2009  . FOOT PAIN, LEFT 12/19/2009  . DIZZINESS 11/25/2008  . Headache(784.0) 06/22/2010  . Impaired glucose tolerance 08/24/2010  . Lumbar disc disease 02/21/2012   Past Surgical History  Procedure Laterality Date  . Tubal ligation    . Abdominal hysterectomy  20063    non malignant  . S/p pilonidal cyst      reports that she quit smoking about 2 years ago. Her smoking use included Cigarettes. She does not have any smokeless tobacco history on file. She reports that she drinks alcohol. She reports that she does not use illicit drugs. family history includes Coronary artery disease in her other; Diabetes in her father, other, and other; Glaucoma in her father; Heart attack in her other; Hyperlipidemia in her mother; Hypertension in her father and mother. No Known Allergies Current Outpatient Prescriptions on File Prior to Visit  Medication Sig Dispense Refill  . atorvastatin (LIPITOR) 10 MG tablet Take 1 tablet (10 mg total) by mouth daily. 90 tablet 3  . fluticasone (FLONASE) 50 MCG/ACT nasal spray Place 2 sprays into both nostrils daily. 16 g 2  . meclizine (ANTIVERT) 25 MG tablet Take 1 tablet (25 mg total) by mouth 3 (three) times daily as needed for dizziness. 30  tablet 0  . meloxicam (MOBIC) 15 MG tablet Take 1 tablet (15 mg total) by mouth daily. 30 tablet 0  . naproxen (NAPROSYN) 500 MG tablet Take 1 tablet (500 mg total) by mouth 2 (two) times daily with a meal. As needed 60 tablet 11  . cyclobenzaprine (FLEXERIL) 5 MG tablet 1-2 qhs prn (Patient not taking: Reported on 10/24/2014) 14 tablet 0  . traMADol (ULTRAM) 50 MG tablet Take 1 tablet (50 mg total) by mouth every 8 (eight) hours as needed. (Patient not taking: Reported on 10/24/2014) 30 tablet 0   No current facility-administered medications on file prior to visit.   Review of Systems  Constitutional: Negative for unusual diaphoresis or night sweats HENT: Negative for ringing in ear or discharge Eyes: Negative for double vision or worsening visual disturbance.  Respiratory: Negative for choking and stridor.   Gastrointestinal: Negative for vomiting or other signifcant bowel change Genitourinary: Negative for hematuria or change in urine volume.  Musculoskeletal: Negative for other MSK pain or swelling Skin: Negative for color change and worsening wound.  Neurological: Negative for tremors and numbness other than noted  Psychiatric/Behavioral: Negative for decreased concentration or agitation other than above       Objective:   Physical Exam BP 122/86 mmHg  Pulse 68  Temp(Src) 98.3 F (36.8 C) (Oral)  Ht  (1.626 m)  Wt 235 lb (106.595 kg)  BMI 40.32 kg/m2  SpO2 93% VS noted, mild ill Constitutional: Pt appears in no significant distress HENT: Head:  NCAT.  Right Ear: External ear normal.  Left Ear: External ear normal.  Eyes: . Pupils are equal, round, and reactive to light. Conjunctivae and EOM are normal Bilat tm's with severe erythema.  Max sinus areas mild to mod tender.  Pharynx with severe erythema, no exudate Neck: Normal range of motion. Neck supple.  Cardiovascular: Normal rate and regular rhythm.   Pulmonary/Chest: Effort normal and breath sounds without rales or  wheezing.  Neurological: Pt is alert. Not confused , motor grossly intact Skin: Skin is warm. No rash, no LE edema Psychiatric: Pt behavior is normal. No agitation.     Assessment & Plan:

## 2014-12-31 NOTE — Patient Instructions (Signed)
Please take all new medication as prescribed - the antibiotic, and cough medicine  Please continue all other medications as before, and refills have been done if requested.  Please have the pharmacy call with any other refills you may need.  Please keep your appointments with your specialists as you may have planned      

## 2014-12-31 NOTE — Assessment & Plan Note (Signed)
Mild to mod, for antibx course,  to f/u any worsening symptoms or concerns 

## 2015-02-26 ENCOUNTER — Other Ambulatory Visit (INDEPENDENT_AMBULATORY_CARE_PROVIDER_SITE_OTHER): Payer: 59

## 2015-02-26 ENCOUNTER — Encounter: Payer: Self-pay | Admitting: Internal Medicine

## 2015-02-26 ENCOUNTER — Ambulatory Visit (INDEPENDENT_AMBULATORY_CARE_PROVIDER_SITE_OTHER): Payer: 59 | Admitting: Internal Medicine

## 2015-02-26 VITALS — BP 110/64 | HR 64 | Temp 98.2°F | Ht 64.0 in | Wt 230.0 lb

## 2015-02-26 DIAGNOSIS — Z23 Encounter for immunization: Secondary | ICD-10-CM

## 2015-02-26 DIAGNOSIS — R7302 Impaired glucose tolerance (oral): Secondary | ICD-10-CM

## 2015-02-26 DIAGNOSIS — Z0000 Encounter for general adult medical examination without abnormal findings: Secondary | ICD-10-CM

## 2015-02-26 LAB — URINALYSIS, ROUTINE W REFLEX MICROSCOPIC
Bilirubin Urine: NEGATIVE
HGB URINE DIPSTICK: NEGATIVE
KETONES UR: NEGATIVE
LEUKOCYTES UA: NEGATIVE
Nitrite: NEGATIVE
Specific Gravity, Urine: 1.025 (ref 1.000–1.030)
Total Protein, Urine: NEGATIVE
URINE GLUCOSE: NEGATIVE
UROBILINOGEN UA: 0.2 (ref 0.0–1.0)
pH: 5.5 (ref 5.0–8.0)

## 2015-02-26 LAB — CBC WITH DIFFERENTIAL/PLATELET
BASOS ABS: 0.1 10*3/uL (ref 0.0–0.1)
Basophils Relative: 0.7 % (ref 0.0–3.0)
EOS ABS: 0.2 10*3/uL (ref 0.0–0.7)
Eosinophils Relative: 2.2 % (ref 0.0–5.0)
HCT: 41.7 % (ref 36.0–46.0)
Hemoglobin: 14.1 g/dL (ref 12.0–15.0)
LYMPHS ABS: 3.6 10*3/uL (ref 0.7–4.0)
Lymphocytes Relative: 43.2 % (ref 12.0–46.0)
MCHC: 33.9 g/dL (ref 30.0–36.0)
MCV: 92.2 fl (ref 78.0–100.0)
MONO ABS: 0.7 10*3/uL (ref 0.1–1.0)
Monocytes Relative: 8.1 % (ref 3.0–12.0)
NEUTROS ABS: 3.8 10*3/uL (ref 1.4–7.7)
NEUTROS PCT: 45.8 % (ref 43.0–77.0)
PLATELETS: 254 10*3/uL (ref 150.0–400.0)
RBC: 4.52 Mil/uL (ref 3.87–5.11)
RDW: 13.3 % (ref 11.5–15.5)
WBC: 8.3 10*3/uL (ref 4.0–10.5)

## 2015-02-26 LAB — HEMOGLOBIN A1C: Hgb A1c MFr Bld: 5.5 % (ref 4.6–6.5)

## 2015-02-26 LAB — BASIC METABOLIC PANEL
BUN: 13 mg/dL (ref 6–23)
CALCIUM: 10 mg/dL (ref 8.4–10.5)
CHLORIDE: 105 meq/L (ref 96–112)
CO2: 27 mEq/L (ref 19–32)
Creatinine, Ser: 0.98 mg/dL (ref 0.40–1.20)
GFR: 78.28 mL/min (ref >60.00)
GLUCOSE: 88 mg/dL (ref 70–99)
Potassium: 4.8 mEq/L (ref 3.5–5.1)
SODIUM: 141 meq/L (ref 135–145)

## 2015-02-26 LAB — HEPATIC FUNCTION PANEL
ALBUMIN: 4.5 g/dL (ref 3.5–5.2)
ALK PHOS: 60 U/L (ref 39–117)
ALT: 12 U/L (ref 0–35)
AST: 13 U/L (ref 0–37)
BILIRUBIN DIRECT: 0.1 mg/dL (ref 0.0–0.3)
Total Bilirubin: 0.3 mg/dL (ref 0.2–1.2)
Total Protein: 7.6 g/dL (ref 6.0–8.3)

## 2015-02-26 LAB — TSH: TSH: 2.86 u[IU]/mL (ref 0.35–4.50)

## 2015-02-26 LAB — LIPID PANEL
CHOLESTEROL: 211 mg/dL — AB (ref 0–200)
HDL: 42.7 mg/dL (ref >39.00)
LDL Cholesterol: 136 mg/dL — ABNORMAL HIGH (ref 0–99)
NONHDL: 168.13
Total CHOL/HDL Ratio: 5
Triglycerides: 159 mg/dL — ABNORMAL HIGH (ref 0.0–149.0)
VLDL: 31.8 mg/dL (ref 0.0–40.0)

## 2015-02-26 NOTE — Assessment & Plan Note (Signed)
stable overall by history and exam, recent data reviewed with pt, and pt to continue medical treatment as before,  to f/u any worsening symptoms or concerns Lab Results  Component Value Date   HGBA1C 5.5 02/15/2012

## 2015-02-26 NOTE — Progress Notes (Signed)
Subjective:    Patient ID: Lauren Fox, female    DOB: 10/25/1965, 49 y.o.   MRN: [id number]  HPI  Here for wellness and f/u;  Overall doing ok;  Pt denies Chest pain, worsening SOB, DOE, wheezing, orthopnea, PND, worsening LE edema, palpitations, dizziness or syncope.  Pt denies neurological change such as new headache, facial or extremity weakness.  Pt denies polydipsia, polyuria, or low sugar symptoms. Pt states overall good compliance with treatment and medications, good tolerability, and has been trying to follow appropriate diet.  Pt denies worsening depressive symptoms, suicidal ideation or panic. No fever, night sweats, wt loss, loss of appetite, or other constitutional symptoms.  Pt states good ability with ADL's, has low fall risk, home safety reviewed and adequate, no other significant changes in hearing or vision, and more active with exercise this year with water aerobics 5 times per wk.  Has lost several lb Has appt for pap in jan 2017 with GYN. Wt Readings from Last 3 Encounters:  02/26/15 230 lb (104.327 kg)  12/31/14 235 lb (106.595 kg)  10/24/14 237 lb (107.502 kg)   Past Medical History  Diagnosis Date  . GLUCOSE INTOLERANCE 11/25/2008  . HYPERLIPIDEMIA 11/25/2008  . Gout, unspecified 11/25/2008  . Morbid obesity (HCC) 11/25/2008  . ALLERGIC RHINITIS 11/25/2008  . Unspecified backache 12/17/2009  . FOOT PAIN, LEFT 12/19/2009  . DIZZINESS 11/25/2008  . Headache(784.0) 06/22/2010  . Impaired glucose tolerance 08/24/2010  . Lumbar disc disease 02/21/2012   Past Surgical History  Procedure Laterality Date  . Tubal ligation    . Abdominal hysterectomy  20063    non malignant  . S/p pilonidal cyst      reports that she quit smoking about 2 years ago. Her smoking use included Cigarettes. She does not have any smokeless tobacco history on file. She reports that she drinks alcohol. She reports that she does not use illicit drugs. family history includes Coronary artery disease in  her other; Diabetes in her father, other, and other; Glaucoma in her father; Heart attack in her other; Hyperlipidemia in her mother; Hypertension in her father and mother. No Known Allergies Current Outpatient Prescriptions on File Prior to Visit  Medication Sig Dispense Refill  . atorvastatin (LIPITOR) 10 MG tablet Take 1 tablet (10 mg total) by mouth daily. 90 tablet 3  . fluticasone (FLONASE) 50 MCG/ACT nasal spray Place 2 sprays into both nostrils daily. 16 g 2  . meclizine (ANTIVERT) 25 MG tablet Take 1 tablet (25 mg total) by mouth 3 (three) times daily as needed for dizziness. 30 tablet 0  . naproxen (NAPROSYN) 500 MG tablet Take 1 tablet (500 mg total) by mouth 2 (two) times daily with a meal. As needed 60 tablet 11   No current facility-administered medications on file prior to visit.    Review of Systems Constitutional: Negative for increased diaphoresis, other activity, appetite or siginficant weight change other than noted HENT: Negative for worsening hearing loss, ear pain, facial swelling, mouth sores and neck stiffness.   Eyes: Negative for other worsening pain, redness or visual disturbance.  Respiratory: Negative for shortness of breath and wheezing  Cardiovascular: Negative for chest pain and palpitations.  Gastrointestinal: Negative for diarrhea, blood in stool, abdominal distention or other pain Genitourinary: Negative for hematuria, flank pain or change in urine volume.  Musculoskeletal: Negative for myalgias or other joint complaints.  Skin: Negative for color change and wound or drainage.  Neurological: Negative for syncope and numbness. other  than noted Hematological: Negative for adenopathy. or other swelling Psychiatric/Behavioral: Negative for hallucinations, SI, self-injury, decreased concentration or other worsening agitation.      Objective:   Physical Exam BP 110/64 mmHg  Pulse 64  Temp(Src) 98.2 F (36.8 C) (Oral)  Ht  (1.626 m)  Wt 230 lb  (104.327 kg)  BMI 39.46 kg/m2  SpO2 97% VS noted,  Constitutional: Pt is oriented to person, place, and time. Appears well-developed and well-nourished, in no significant distress Head: Normocephalic and atraumatic.  Right Ear: External ear normal.  Left Ear: External ear normal.  Nose: Nose normal.  Mouth/Throat: Oropharynx is clear and moist.  Eyes: Conjunctivae and EOM are normal. Pupils are equal, round, and reactive to light.  Neck: Normal range of motion. Neck supple. No JVD present. No tracheal deviation present or significant neck LA or mass Cardiovascular: Normal rate, regular rhythm, normal heart sounds and intact distal pulses.   Pulmonary/Chest: Effort normal and breath sounds without rales or wheezing  Abdominal: Soft. Bowel sounds are normal. NT. No HSM  Musculoskeletal: Normal range of motion. Exhibits no edema.  Lymphadenopathy:  Has no cervical adenopathy.  Neurological: Pt is alert and oriented to person, place, and time. Pt has normal reflexes. No cranial nerve deficit. Motor grossly intact Skin: Skin is warm and dry. No rash noted.  Psychiatric:  Has normal mood and affect. Behavior is normal.     Assessment & Plan:

## 2015-02-26 NOTE — Patient Instructions (Addendum)
You had the flu shot today  Please start Aspirin 81 mg per day, to help reduce risk of blood clots, heart disease and stroke  Please continue all other medications as before, and refills have been done if requested.  Please have the pharmacy call with any other refills you may need.  Please continue your efforts at being more active, low cholesterol diet, and weight control.  You are otherwise up to date with prevention measures today.  Please keep your appointments with your specialists as you may have planned  Please go to the LAB in the Basement (turn left off the elevator) for the tests to be done today  You will be contacted by phone if any changes need to be made immediately.  Otherwise, you will receive a letter about your results with an explanation, but please check with MyChart first.  Please remember to sign up for MyChart if you have not done so, as this will be important to you in the future with finding out test results, communicating by private email, and scheduling acute appointments online when needed.  Please return in 1 year for your yearly visit, or sooner if needed, with Lab testing done 3-5 days before

## 2015-02-26 NOTE — Progress Notes (Signed)
Pre visit review using our clinic review tool, if applicable. No additional management support is needed unless otherwise documented below in the visit note. 

## 2015-02-26 NOTE — Assessment & Plan Note (Signed)

## 2015-04-13 ENCOUNTER — Other Ambulatory Visit: Payer: Self-pay

## 2015-04-13 ENCOUNTER — Encounter: Payer: Self-pay | Admitting: Internal Medicine

## 2015-04-13 MED ORDER — FLUTICASONE PROPIONATE 50 MCG/ACT NA SUSP: 2.0000 | Freq: Every day | NASAL | Status: DC

## 2015-04-14 ENCOUNTER — Ambulatory Visit (INDEPENDENT_AMBULATORY_CARE_PROVIDER_SITE_OTHER): Payer: 59 | Admitting: Internal Medicine

## 2015-04-14 VITALS — BP 124/90 | HR 76 | Temp 98.4°F | Ht 64.0 in | Wt 233.0 lb

## 2015-04-14 DIAGNOSIS — J309 Allergic rhinitis, unspecified: Secondary | ICD-10-CM

## 2015-04-14 DIAGNOSIS — J019 Acute sinusitis, unspecified: Secondary | ICD-10-CM

## 2015-04-14 DIAGNOSIS — R7302 Impaired glucose tolerance (oral): Secondary | ICD-10-CM | POA: Diagnosis not present

## 2015-04-14 MED ORDER — LEVOFLOXACIN 500 MG PO TABS: 500.0000 mg | ORAL_TABLET | Freq: Every day | ORAL | Status: DC

## 2015-04-14 NOTE — Progress Notes (Signed)
Pre visit review using our clinic review tool, if applicable. No additional management support is needed unless otherwise documented below in the visit note. 

## 2015-04-14 NOTE — Progress Notes (Signed)
Subjective:    Patient ID: Lauren Fox, female    DOB: 12/03/1965, 49 y.o.   MRN: [id number]  HPI     Here with 2-3 days acute onset fever, facial pain, pressure, headache, general weakness and malaise, and greenish d/c, with mild ST and cough, but pt denies chest pain, wheezing, increased sob or doe, orthopnea, PND, increased LE swelling, palpitations, dizziness or syncope. Pt denies new neurological symptoms such as new headache, or facial or extremity weakness or numbness   Pt denies polydipsia, polyuria. Does have several wks ongoing nasal allergy symptoms with clearish congestion, itch and sneezing, without fever, pain, ST,  Past Medical History  Diagnosis Date  . GLUCOSE INTOLERANCE 11/25/2008  . HYPERLIPIDEMIA 11/25/2008  . Gout, unspecified 11/25/2008  . Morbid obesity (HCC) 11/25/2008  . ALLERGIC RHINITIS 11/25/2008  . Unspecified backache 12/17/2009  . FOOT PAIN, LEFT 12/19/2009  . DIZZINESS 11/25/2008  . Headache(784.0) 06/22/2010  . Impaired glucose tolerance 08/24/2010  . Lumbar disc disease 02/21/2012   Past Surgical History  Procedure Laterality Date  . Tubal ligation    . Abdominal hysterectomy  20063    non malignant  . S/p pilonidal cyst      reports that she quit smoking about 2 years ago. Her smoking use included Cigarettes. She does not have any smokeless tobacco history on file. She reports that she drinks alcohol. She reports that she does not use illicit drugs. family history includes Coronary artery disease in her other; Diabetes in her father, other, and other; Glaucoma in her father; Heart attack in her other; Hyperlipidemia in her mother; Hypertension in her father and mother. No Known Allergies Current Outpatient Prescriptions on File Prior to Visit  Medication Sig Dispense Refill  . atorvastatin (LIPITOR) 10 MG tablet Take 1 tablet (10 mg total) by mouth daily. 90 tablet 3  . fluticasone (FLONASE) 50 MCG/ACT nasal spray Place 2 sprays into both nostrils daily.  16 g 2  . meclizine (ANTIVERT) 25 MG tablet Take 1 tablet (25 mg total) by mouth 3 (three) times daily as needed for dizziness. 30 tablet 0  . naproxen (NAPROSYN) 500 MG tablet Take 1 tablet (500 mg total) by mouth 2 (two) times daily with a meal. As needed 60 tablet 11   No current facility-administered medications on file prior to visit.   Review of Systems  Constitutional: Negative for unusual diaphoresis or night sweats HENT: Negative for ringing in ear or discharge Eyes: Negative for double vision or worsening visual disturbance.  Respiratory: Negative for choking and stridor.   Gastrointestinal: Negative for vomiting or other signifcant bowel change Genitourinary: Negative for hematuria or change in urine volume.  Musculoskeletal: Negative for other MSK pain or swelling Skin: Negative for color change and worsening wound.  Neurological: Negative for tremors and numbness other than noted  Psychiatric/Behavioral: Negative for decreased concentration or agitation other than above       Objective:   Physical Exam BP 124/90 mmHg  Pulse 76  Temp(Src) 98.4 F (36.9 C) (Oral)  Ht 5\' 4"  (1.626 m)  Wt 233 lb (105.688 kg)  BMI 39.97 kg/m2  SpO2 97% VS noted, mild ill Constitutional: Pt appears in no significant distress HENT: Head: NCAT.  Right Ear: External ear normal.  Left Ear: External ear normal.  Bilat tm's with mild erythema.  Max sinus areas mild tender.  Pharynx with mild erythema, no exudate Eyes: . Pupils are equal, round, and reactive to light. Conjunctivae and EOM are normal  Neck: Normal range of motion. Neck supple.  Cardiovascular: Normal rate and regular rhythm.   Pulmonary/Chest: Effort normal and breath sounds without rales or wheezing.  Neurological: Pt is alert. Not confused , motor grossly intact Skin: Skin is warm. No rash, no LE edema Psychiatric: Pt behavior is normal. No agitation.     Assessment & Plan:

## 2015-04-14 NOTE — Patient Instructions (Signed)
Please take all new medication as prescribed - the antibiotic  You can also take Delsym OTC for cough, and/or Mucinex (or it's generic off brand) for congestion, and tylenol as needed for pain.  Please continue all other medications as before, and refills have been done if requested.  Please have the pharmacy call with any other refills you may need.  Please keep your appointments with your specialists as you may have planned    

## 2015-04-15 NOTE — Assessment & Plan Note (Signed)
Mild to mod, for flonase re-start,,  to f/u any worsening symptoms or concerns

## 2015-04-15 NOTE — Assessment & Plan Note (Signed)
Mild to mod, for antibx course,  to f/u any worsening symptoms or concerns 

## 2015-04-15 NOTE — Assessment & Plan Note (Signed)
stable overall by history and exam, recent data reviewed with pt, and pt to continue medical treatment as before,  to f/u any worsening symptoms or concerns Lab Results  Component Value Date   HGBA1C 5.5 02/26/2015

## 2015-07-30 ENCOUNTER — Encounter: Payer: Self-pay | Admitting: Internal Medicine

## 2015-07-31 ENCOUNTER — Other Ambulatory Visit (INDEPENDENT_AMBULATORY_CARE_PROVIDER_SITE_OTHER): Payer: 59

## 2015-07-31 ENCOUNTER — Ambulatory Visit (INDEPENDENT_AMBULATORY_CARE_PROVIDER_SITE_OTHER): Payer: 59 | Admitting: Internal Medicine

## 2015-07-31 ENCOUNTER — Encounter: Payer: Self-pay | Admitting: Internal Medicine

## 2015-07-31 VITALS — BP 126/82 | HR 82 | Temp 98.2°F | Ht 64.0 in | Wt 237.0 lb

## 2015-07-31 DIAGNOSIS — R112 Nausea with vomiting, unspecified: Secondary | ICD-10-CM | POA: Diagnosis not present

## 2015-07-31 DIAGNOSIS — R1011 Right upper quadrant pain: Secondary | ICD-10-CM

## 2015-07-31 DIAGNOSIS — R7302 Impaired glucose tolerance (oral): Secondary | ICD-10-CM | POA: Diagnosis not present

## 2015-07-31 DIAGNOSIS — R109 Unspecified abdominal pain: Secondary | ICD-10-CM | POA: Diagnosis not present

## 2015-07-31 LAB — BASIC METABOLIC PANEL
BUN: 10 mg/dL (ref 6–23)
CO2: 28 mEq/L (ref 19–32)
Calcium: 9.3 mg/dL (ref 8.4–10.5)
Chloride: 105 mEq/L (ref 96–112)
Creatinine, Ser: 0.93 mg/dL (ref 0.40–1.20)
GFR: 83.01 mL/min (ref >60.00)
Glucose, Bld: 90 mg/dL (ref 70–99)
POTASSIUM: 3.9 meq/L (ref 3.5–5.1)
SODIUM: 140 meq/L (ref 135–145)

## 2015-07-31 LAB — CBC WITH DIFFERENTIAL/PLATELET
Basophils Absolute: 0.1 10*3/uL (ref 0.0–0.1)
Basophils Relative: 0.9 % (ref 0.0–3.0)
EOS ABS: 0.3 10*3/uL (ref 0.0–0.7)
Eosinophils Relative: 4 % (ref 0.0–5.0)
HCT: 39.5 % (ref 36.0–46.0)
HEMOGLOBIN: 13.4 g/dL (ref 12.0–15.0)
LYMPHS PCT: 36.6 % (ref 12.0–46.0)
Lymphs Abs: 2.7 10*3/uL (ref 0.7–4.0)
MCHC: 34 g/dL (ref 30.0–36.0)
MCV: 90.3 fl (ref 78.0–100.0)
MONOS PCT: 10.6 % (ref 3.0–12.0)
Monocytes Absolute: 0.8 10*3/uL (ref 0.1–1.0)
NEUTROS ABS: 3.6 10*3/uL (ref 1.4–7.7)
Neutrophils Relative %: 47.9 % (ref 43.0–77.0)
PLATELETS: 252 10*3/uL (ref 150.0–400.0)
RBC: 4.38 Mil/uL (ref 3.87–5.11)
RDW: 14 % (ref 11.5–15.5)
WBC: 7.5 10*3/uL (ref 4.0–10.5)

## 2015-07-31 LAB — URINALYSIS, ROUTINE W REFLEX MICROSCOPIC
Bilirubin Urine: NEGATIVE
HGB URINE DIPSTICK: NEGATIVE
Ketones, ur: NEGATIVE
Nitrite: NEGATIVE
Specific Gravity, Urine: 1.01 (ref 1.000–1.030)
Total Protein, Urine: NEGATIVE
Urine Glucose: NEGATIVE
Urobilinogen, UA: 0.2 (ref 0.0–1.0)
pH: 6.5 (ref 5.0–8.0)

## 2015-07-31 LAB — HEPATIC FUNCTION PANEL
ALK PHOS: 60 U/L (ref 39–117)
ALT: 18 U/L (ref 0–35)
AST: 16 U/L (ref 0–37)
Albumin: 4.5 g/dL (ref 3.5–5.2)
BILIRUBIN DIRECT: 0.1 mg/dL (ref 0.0–0.3)
BILIRUBIN TOTAL: 0.3 mg/dL (ref 0.2–1.2)
Total Protein: 7.3 g/dL (ref 6.0–8.3)

## 2015-07-31 LAB — LIPASE: LIPASE: 17 U/L (ref 11.0–59.0)

## 2015-07-31 MED ORDER — HYDROCODONE-ACETAMINOPHEN 7.5-325 MG PO TABS: 1.0000 | ORAL_TABLET | Freq: Four times a day (QID) | ORAL | Status: DC | PRN

## 2015-07-31 MED ORDER — ONDANSETRON HCL 4 MG PO TABS: 4.0000 mg | ORAL_TABLET | Freq: Three times a day (TID) | ORAL | Status: DC | PRN

## 2015-07-31 NOTE — Patient Instructions (Addendum)
Please take all new medication as prescribed  - the pain medication, and nausea medication  Please continue all other medications as before, and refills have been done if requested.  Please have the pharmacy call with any other refills you may need.  Please continue your efforts at being more active, low cholesterol diet, and weight control.  You are otherwise up to date with prevention measures today.  Please keep your appointments with your specialists as you may have planned  You will be contacted regarding the referral for: Gallbladder ultrasound (Now - to see PCC's NOW please)  Please go to the LAB in the Basement (turn left off the elevator) for the tests to be done today  You will be contacted by phone if any changes need to be made immediately.  Otherwise, you will receive a letter about your results with an explanation, but please check with MyChart first.  Please remember to sign up for MyChart if you have not done so, as this will be important to you in the future with finding out test results, communicating by private email, and scheduling acute appointments online when needed.

## 2015-07-31 NOTE — Progress Notes (Signed)
Pre visit review using our clinic review tool, if applicable. No additional management support is needed unless otherwise documented below in the visit note. 

## 2015-07-31 NOTE — Progress Notes (Signed)
Subjective:    Patient ID: Lauren Fox, female    DOB: 05/08/1966, 50 y.o.   MRN: [id number]  HPI  Here to f/u with acute visit, c/o 4 days sudden onset , moderate, dull pain located primarily right flank/side/RUQ, not sure where it seems to start, worse to eat sometimes, assoc with n/v, and in the last few days has been pleuritic at 10/10 at times.  No fever, ST, cough and Pt denies other pain, increased sob or doe, wheezing, orthopnea, PND, increased LE swelling, palpitations, dizziness or syncope.  Denies worsening reflux, dysphagia, bowel change or blood. Denies urinary symptoms such as dysuria, frequency, urgency, flank pain, hematuria or n/v, fever, chills.   Pt denies polydipsia, polyuria    Past Medical History  Diagnosis Date  . GLUCOSE INTOLERANCE 11/25/2008  . HYPERLIPIDEMIA 11/25/2008  . Gout, unspecified 11/25/2008  . Morbid obesity (HCC) 11/25/2008  . ALLERGIC RHINITIS 11/25/2008  . Unspecified backache 12/17/2009  . FOOT PAIN, LEFT 12/19/2009  . DIZZINESS 11/25/2008  . Headache(784.0) 06/22/2010  . Impaired glucose tolerance 08/24/2010  . Lumbar disc disease 02/21/2012   Past Surgical History  Procedure Laterality Date  . Tubal ligation    . Abdominal hysterectomy  20063    non malignant  . S/p pilonidal cyst      reports that she quit smoking about 3 years ago. Her smoking use included Cigarettes. She does not have any smokeless tobacco history on file. She reports that she drinks alcohol. She reports that she does not use illicit drugs. family history includes Coronary artery disease in her other; Diabetes in her father, other, and other; Glaucoma in her father; Heart attack in her other; Hyperlipidemia in her mother; Hypertension in her father and mother. No Known Allergies Current Outpatient Prescriptions on File Prior to Visit  Medication Sig Dispense Refill  . atorvastatin (LIPITOR) 10 MG tablet Take 1 tablet (10 mg total) by mouth daily. 90 tablet 3  . fluticasone  (FLONASE) 50 MCG/ACT nasal spray Place 2 sprays into both nostrils daily. 16 g 2  . meclizine (ANTIVERT) 25 MG tablet Take 1 tablet (25 mg total) by mouth 3 (three) times daily as needed for dizziness. 30 tablet 0  . naproxen (NAPROSYN) 500 MG tablet Take 1 tablet (500 mg total) by mouth 2 (two) times daily with a meal. As needed 60 tablet 11  . levofloxacin (LEVAQUIN) 500 MG tablet Take 1 tablet (500 mg total) by mouth daily. (Patient not taking: Reported on 07/31/2015) 10 tablet 0   No current facility-administered medications on file prior to visit.   Review of Systems  Constitutional: Negative for unusual diaphoresis or night sweats HENT: Negative for ringing in ear or discharge Eyes: Negative for double vision or worsening visual disturbance.  Respiratory: Negative for choking and stridor.   Gastrointestinal: Negative for vomiting or other signifcant bowel change Genitourinary: Negative for hematuria or change in urine volume.  Musculoskeletal: Negative for other MSK pain or swelling Skin: Negative for color change and worsening wound.  Neurological: Negative for tremors and numbness other than noted  Psychiatric/Behavioral: Negative for decreased concentration or agitation other than above       Objective:   Physical Exam BP 126/82 mmHg  Pulse 82  Temp(Src) 98.2 F (36.8 C) (Oral)  Ht  (1.626 m)  Wt 237 lb (107.502 kg)  BMI 40.66 kg/m2  SpO2 97% VS noted,  Constitutional: Pt appears in no significant distress HENT: Head: NCAT.  Right Ear: External ear  normal.  Left Ear: External ear normal.  Eyes: . Pupils are equal, round, and reactive to light. Conjunctivae and EOM are normal Neck: Normal range of motion. Neck supple.  Cardiovascular: Normal rate and regular rhythm.   Pulmonary/Chest: Effort normal and breath sounds without rales or wheezing.  Abd:  Soft, ND, + BS, tender RUQ without guarding or rebound, also some tender to right side and flank, no rash or  swelling Neurological: Pt is alert. Not confused , motor grossly intact Skin: Skin is warm. No rash, no LE edema Psychiatric: Pt behavior is normal. No agitation.     Assessment & Plan:

## 2015-08-01 NOTE — Assessment & Plan Note (Signed)
For anti-emetic prn, diet as tolerated,  to f/u any worsening symptoms or concerns

## 2015-08-01 NOTE — Assessment & Plan Note (Signed)
Also for UA but no GU symptoms, afeb, hold antibx for now

## 2015-08-01 NOTE — Assessment & Plan Note (Signed)
Etiology unclear but higher suspicion for GB inflammation, though pain per pt may be more primarly located right flank, for cxr, labs including UA and stat GB U/S, for pain control and anti-emetic, to ER for worsening ,  to f/u any worsening symptoms or concerns

## 2015-08-01 NOTE — Assessment & Plan Note (Signed)
stable overall by history and exam, recent data reviewed with pt, and pt to continue medical treatment as before,  to f/u any worsening symptoms or concerns Lab Results  Component Value Date   HGBA1C 5.5 02/26/2015    

## 2015-08-04 ENCOUNTER — Ambulatory Visit (HOSPITAL_COMMUNITY)
Admission: RE | Admit: 2015-08-04 | Discharge: 2015-08-04 | Disposition: A | Discharge status: Home / Self Care | Payer: 59 | Source: Ambulatory Visit | Attending: Internal Medicine | Admitting: Internal Medicine

## 2015-08-04 DIAGNOSIS — R112 Nausea with vomiting, unspecified: Secondary | ICD-10-CM | POA: Insufficient documentation

## 2015-08-04 DIAGNOSIS — R109 Unspecified abdominal pain: Secondary | ICD-10-CM | POA: Insufficient documentation

## 2015-08-04 DIAGNOSIS — R1011 Right upper quadrant pain: Secondary | ICD-10-CM | POA: Diagnosis present

## 2015-08-04 DIAGNOSIS — R7302 Impaired glucose tolerance (oral): Secondary | ICD-10-CM

## 2015-08-04 DIAGNOSIS — K760 Fatty (change of) liver, not elsewhere classified: Secondary | ICD-10-CM | POA: Insufficient documentation

## 2016-03-16 ENCOUNTER — Encounter: Payer: Self-pay | Admitting: Internal Medicine

## 2016-03-17 ENCOUNTER — Encounter: Payer: Self-pay | Admitting: Internal Medicine

## 2016-03-17 MED ORDER — NAPROXEN 500 MG PO TABS: 500.0000 mg | ORAL_TABLET | Freq: Two times a day (BID) | ORAL | 5 refills | Status: DC

## 2016-03-28 ENCOUNTER — Encounter: Payer: Self-pay | Admitting: Family Medicine

## 2016-03-28 ENCOUNTER — Ambulatory Visit (INDEPENDENT_AMBULATORY_CARE_PROVIDER_SITE_OTHER): Payer: 59 | Admitting: Family Medicine

## 2016-03-28 VITALS — BP 122/86 | HR 82 | Temp 98.4°F | Wt 231.8 lb

## 2016-03-28 DIAGNOSIS — J0100 Acute maxillary sinusitis, unspecified: Secondary | ICD-10-CM

## 2016-03-28 DIAGNOSIS — J3089 Other allergic rhinitis: Secondary | ICD-10-CM | POA: Diagnosis not present

## 2016-03-28 MED ORDER — AMOXICILLIN-POT CLAVULANATE 875-125 MG PO TABS: 1.0000 | ORAL_TABLET | Freq: Two times a day (BID) | ORAL | 0 refills | Status: DC

## 2016-03-28 NOTE — Progress Notes (Signed)
Pre visit review using our clinic review tool, if applicable. No additional management support is needed unless otherwise documented below in the visit note. 

## 2016-03-28 NOTE — Patient Instructions (Signed)
Please take antibiotic as directed and follow up with Dr. Jonny RuizJohn if symptoms do not improve in 3 to 4 days, worsen, or you develop a fever >100.    Sinusitis, Adult Sinusitis is redness, soreness, and inflammation of the paranasal sinuses. Paranasal sinuses are air pockets within the bones of your face. They are located beneath your eyes, in the middle of your forehead, and above your eyes. In healthy paranasal sinuses, mucus is able to drain out, and air is able to circulate through them by way of your nose. However, when your paranasal sinuses are inflamed, mucus and air can become trapped. This can allow bacteria and other germs to grow and cause infection. Sinusitis can develop quickly and last only a short time (acute) or continue over a long period (chronic). Sinusitis that lasts for more than 12 weeks is considered chronic. CAUSES Causes of sinusitis include:  Allergies.  Structural abnormalities, such as displacement of the cartilage that separates your nostrils (deviated septum), which can decrease the air flow through your nose and sinuses and affect sinus drainage.  Functional abnormalities, such as when the small hairs (cilia) that line your sinuses and help remove mucus do not work properly or are not present. SIGNS AND SYMPTOMS Symptoms of acute and chronic sinusitis are the same. The primary symptoms are pain and pressure around the affected sinuses. Other symptoms include:  Upper toothache.  Earache.  Headache.  Bad breath.  Decreased sense of smell and taste.  A cough, which worsens when you are lying flat.  Fatigue.  Fever.  Thick drainage from your nose, which often is green and may contain pus (purulent).  Swelling and warmth over the affected sinuses. DIAGNOSIS Your health care provider will perform a physical exam. During your exam, your health care provider may perform any of the following to help determine if you have acute sinusitis or chronic  sinusitis:  Look in your nose for signs of abnormal growths in your nostrils (nasal polyps).  Tap over the affected sinus to check for signs of infection.  View the inside of your sinuses using an imaging device that has a light attached (endoscope). If your health care provider suspects that you have chronic sinusitis, one or more of the following tests may be recommended:  Allergy tests.  Nasal culture. A sample of mucus is taken from your nose, sent to a lab, and screened for bacteria.  Nasal cytology. A sample of mucus is taken from your nose and examined by your health care provider to determine if your sinusitis is related to an allergy. TREATMENT Most cases of acute sinusitis are related to a viral infection and will resolve on their own within 10 days. Sometimes, medicines are prescribed to help relieve symptoms of both acute and chronic sinusitis. These may include pain medicines, decongestants, nasal steroid sprays, or saline sprays. However, for sinusitis related to a bacterial infection, your health care provider will prescribe antibiotic medicines. These are medicines that will help kill the bacteria causing the infection. Rarely, sinusitis is caused by a fungal infection. In these cases, your health care provider will prescribe antifungal medicine. For some cases of chronic sinusitis, surgery is needed. Generally, these are cases in which sinusitis recurs more than 3 times per year, despite other treatments. HOME CARE INSTRUCTIONS  Drink plenty of water. Water helps thin the mucus so your sinuses can drain more easily.  Use a humidifier.  Inhale steam 3-4 times a day (for example, sit in the bathroom with  the shower running).  Apply a warm, moist washcloth to your face 3-4 times a day, or as directed by your health care provider.  Use saline nasal sprays to help moisten and clean your sinuses.  Take medicines only as directed by your health care provider.  If you were  prescribed either an antibiotic or antifungal medicine, finish it all even if you start to feel better. SEEK IMMEDIATE MEDICAL CARE IF:  You have increasing pain or severe headaches.  You have nausea, vomiting, or drowsiness.  You have swelling around your face.  You have vision problems.  You have a stiff neck.  You have difficulty breathing.   This information is not intended to replace advice given to you by your health care provider. Make sure you discuss any questions you have with your health care provider.   Document Released: 05/02/2005 Document Revised: 05/23/2014 Document Reviewed: 05/17/2011 Elsevier Interactive Patient Education [employer]2016 Elsevier Inc.

## 2016-03-28 NOTE — Progress Notes (Signed)
Subjective:    Patient ID: Lauren Fox, female    DOB: 07/07/1965, 50 y.o.   MRN: [id number]  HPI  Ms. Emelda FearFerguson is a 50 year old female who presents acutely ill  today with sinus pressure/pain that started 4 days ago. Associated symptoms of chills, nasal congestion, rhinitis with yellow drainage, left ear pain, nonproductive cough, tooth pain,and sore throat.  Denies fever, sweats, N/V/D/, or myalgias.  She denies recent antibiotic use or recent sick contact exposure. No history of asthma/bronchitis.  She is a nonsmoker. History of allergic rhinitis present that is treated with flonase. Symptoms of itchy/watery eyes and post nasal drip are noted.  Treatment at home with sinus advil that has provided limited benefit. No aggravating or alleviating factors are noted.  Review of Systems  Constitutional: Positive for chills. Negative for fatigue and fever.  HENT: Positive for congestion, ear pain, postnasal drip, rhinorrhea, sinus pain, sinus pressure and sore throat.   Respiratory: Positive for cough. Negative for shortness of breath and wheezing.   Cardiovascular: Negative for chest pain and palpitations.  Gastrointestinal: Negative for abdominal pain, diarrhea, nausea and vomiting.  Musculoskeletal: Negative for myalgias.  Skin: Negative for rash.  Neurological: Negative for dizziness, weakness, light-headedness and headaches.   Past Medical History:  Diagnosis Date  . ALLERGIC RHINITIS 11/25/2008  . DIZZINESS 11/25/2008  . FOOT PAIN, LEFT 12/19/2009  . GLUCOSE INTOLERANCE 11/25/2008  . Gout, unspecified 11/25/2008  . Headache(784.0) 06/22/2010  . HYPERLIPIDEMIA 11/25/2008  . Impaired glucose tolerance 08/24/2010  . Lumbar disc disease 02/21/2012  . Morbid obesity (HCC) 11/25/2008  . Unspecified backache 12/17/2009     Social History   Social History  . Marital status: Married    Spouse name: N/A  . Number of children: 3  . Years of education: N/A   Occupational History  . inpt.  coordinator [employer]   Social History Main Topics  . Smoking status: Former Smoker    Types: Cigarettes    Quit date: 05/16/2012  . Smokeless tobacco: Not on file  . Alcohol use 0.0 oz/week     Comment: social  . Drug use: No  . Sexual activity: Not on file   Other Topics Concern  . Not on file   Social History Narrative  . No narrative on file    Past Surgical History:  Procedure Laterality Date  . ABDOMINAL HYSTERECTOMY  20063   non malignant  . s/p pilonidal cyst    . TUBAL LIGATION      Family History  Problem Relation Age of Onset  . Hyperlipidemia Mother   . Hypertension Mother   . Glaucoma Father   . Hypertension Father   . Diabetes Father   . Diabetes Other   . Heart attack Other   . Coronary artery disease Other   . Diabetes Other     No Known Allergies  Current Outpatient Prescriptions on File Prior to Visit  Medication Sig Dispense Refill  . naproxen (NAPROSYN) 500 MG tablet Take 1 tablet (500 mg total) by mouth 2 (two) times daily with a meal. As needed 60 tablet 5  . atorvastatin (LIPITOR) 10 MG tablet Take 1 tablet (10 mg total) by mouth daily. (Patient not taking: Reported on 03/28/2016) 90 tablet 3  . fluticasone (FLONASE) 50 MCG/ACT nasal spray Place 2 sprays into both nostrils daily. 16 g 2  . HYDROcodone-acetaminophen (NORCO) 7.5-325 MG tablet Take 1 tablet by mouth every 6 (six) hours as needed  for moderate pain. (Patient not taking: Reported on 03/28/2016) 40 tablet 0  . meclizine (ANTIVERT) 25 MG tablet Take 1 tablet (25 mg total) by mouth 3 (three) times daily as needed for dizziness. (Patient not taking: Reported on 03/28/2016) 30 tablet 0  . ondansetron (ZOFRAN) 4 MG tablet Take 1 tablet (4 mg total) by mouth every 8 (eight) hours as needed for nausea or vomiting. (Patient not taking: Reported on 03/28/2016) 30 tablet 0   No current facility-administered medications on file prior to visit.     BP 122/86 (BP  Location: Right Arm, Patient Position: Sitting, Cuff Size: Normal)   Pulse 82   Temp 98.4 F (36.9 C) (Oral)   Wt 231 lb 12.8 oz (105.1 kg)   SpO2 98%   BMI 39.79 kg/m       Objective:   Physical Exam  Constitutional: She is oriented to person, place, and time. She appears well-developed and well-nourished.  HENT:  Right Ear: Tympanic membrane normal.  Left Ear: Tympanic membrane normal.  Nose: Rhinorrhea present. Right sinus exhibits maxillary sinus tenderness. Right sinus exhibits no frontal sinus tenderness. Left sinus exhibits maxillary sinus tenderness. Left sinus exhibits no frontal sinus tenderness.  Mouth/Throat: Mucous membranes are normal. No oropharyngeal exudate or posterior oropharyngeal erythema.  Eyes: Pupils are equal, round, and reactive to light. No scleral icterus.  Neck: Neck supple.  Cardiovascular: Normal rate and regular rhythm.   Pulmonary/Chest: Effort normal and breath sounds normal. She has no wheezes. She has no rales.  Abdominal: Soft. Bowel sounds are normal. There is no tenderness.  Lymphadenopathy:    She has cervical adenopathy.  Neurological: She is alert and oriented to person, place, and time.  Skin: Skin is warm and dry. No rash noted.       Assessment & Plan:  1. Acute maxillary sinusitis, recurrence not specified Acutely ill appearance with obvious discomfort with palpating maxillary sinus area supports treatment for sinusitis. Advised patient to follow up for further evaluation if symptoms do not improve in 3 to 4 days, worsen, or she develop a fever >100.  - amoxicillin-clavulanate (AUGMENTIN) 875-125 MG tablet; Take 1 tablet by mouth 2 (two) times daily.  Dispense: 20 tablet; Refill: 0   2. Chronic allergic rhinitis due to other allergic trigger, unspecified seasonality Advised consistent treatment of allergies with flonase and either Allegra, Claritin, or Zyrtec for symptoms.  Roddie McJulia Buford Gayler, FNP-C

## 2016-03-30 ENCOUNTER — Encounter: Payer: Self-pay | Admitting: Family Medicine

## 2016-03-30 ENCOUNTER — Other Ambulatory Visit: Payer: Self-pay | Admitting: Family Medicine

## 2016-03-30 DIAGNOSIS — R05 Cough: Secondary | ICD-10-CM

## 2016-03-30 DIAGNOSIS — R059 Cough, unspecified: Secondary | ICD-10-CM

## 2016-03-30 MED ORDER — BENZONATATE 100 MG PO CAPS: 100.0000 mg | ORAL_CAPSULE | Freq: Three times a day (TID) | ORAL | 0 refills | Status: DC

## 2016-03-30 NOTE — Progress Notes (Signed)
Benzonatate has been called to pharmacy for patient's cough. These tablets can be taken TID for cough as needed.

## 2016-03-31 NOTE — Progress Notes (Signed)
Spoke to patient verbalized understanding that Adaline SillJulia Kordsmeir FNP send a prescription for Benzonatate 100mg  tablets  to take three times as needed for cough.

## 2016-04-05 ENCOUNTER — Ambulatory Visit (INDEPENDENT_AMBULATORY_CARE_PROVIDER_SITE_OTHER): Payer: 59 | Admitting: Internal Medicine

## 2016-04-05 ENCOUNTER — Encounter: Payer: Self-pay | Admitting: Internal Medicine

## 2016-04-05 ENCOUNTER — Other Ambulatory Visit (INDEPENDENT_AMBULATORY_CARE_PROVIDER_SITE_OTHER): Payer: 59

## 2016-04-05 VITALS — BP 136/74 | HR 69 | Temp 98.0°F | Resp 20 | Wt 233.0 lb

## 2016-04-05 DIAGNOSIS — Z0000 Encounter for general adult medical examination without abnormal findings: Secondary | ICD-10-CM

## 2016-04-05 DIAGNOSIS — J309 Allergic rhinitis, unspecified: Secondary | ICD-10-CM | POA: Diagnosis not present

## 2016-04-05 LAB — HEPATIC FUNCTION PANEL
ALBUMIN: 4.5 g/dL (ref 3.5–5.2)
ALK PHOS: 65 U/L (ref 39–117)
ALT: 17 U/L (ref 0–35)
AST: 15 U/L (ref 0–37)
BILIRUBIN TOTAL: 0.3 mg/dL (ref 0.2–1.2)
Bilirubin, Direct: 0.1 mg/dL (ref 0.0–0.3)
Total Protein: 7.4 g/dL (ref 6.0–8.3)

## 2016-04-05 LAB — LIPID PANEL
CHOLESTEROL: 170 mg/dL (ref 0–200)
HDL: 43.8 mg/dL (ref >39.00)
LDL Cholesterol: 104 mg/dL — ABNORMAL HIGH (ref 0–99)
NonHDL: 125.99
TRIGLYCERIDES: 112 mg/dL (ref 0.0–149.0)
Total CHOL/HDL Ratio: 4
VLDL: 22.4 mg/dL (ref 0.0–40.0)

## 2016-04-05 LAB — BASIC METABOLIC PANEL
BUN: 7 mg/dL (ref 6–23)
CHLORIDE: 105 meq/L (ref 96–112)
CO2: 28 mEq/L (ref 19–32)
Calcium: 9.6 mg/dL (ref 8.4–10.5)
Creatinine, Ser: 0.86 mg/dL (ref 0.40–1.20)
GFR: 90.59 mL/min (ref >60.00)
GLUCOSE: 84 mg/dL (ref 70–99)
POTASSIUM: 4.5 meq/L (ref 3.5–5.1)
SODIUM: 140 meq/L (ref 135–145)

## 2016-04-05 LAB — CBC WITH DIFFERENTIAL/PLATELET
BASOS PCT: 2.1 % (ref 0.0–3.0)
Basophils Absolute: 0.2 10*3/uL — ABNORMAL HIGH (ref 0.0–0.1)
EOS ABS: 0.5 10*3/uL (ref 0.0–0.7)
Eosinophils Relative: 5.6 % — ABNORMAL HIGH (ref 0.0–5.0)
HCT: 40.7 % (ref 36.0–46.0)
HEMOGLOBIN: 14 g/dL (ref 12.0–15.0)
LYMPHS ABS: 3.6 10*3/uL (ref 0.7–4.0)
Lymphocytes Relative: 43.7 % (ref 12.0–46.0)
MCHC: 34.4 g/dL (ref 30.0–36.0)
MCV: 91.3 fl (ref 78.0–100.0)
MONO ABS: 0.7 10*3/uL (ref 0.1–1.0)
Monocytes Relative: 7.9 % (ref 3.0–12.0)
NEUTROS PCT: 40.7 % — AB (ref 43.0–77.0)
Neutro Abs: 3.4 10*3/uL (ref 1.4–7.7)
PLATELETS: 270 10*3/uL (ref 150.0–400.0)
RBC: 4.46 Mil/uL (ref 3.87–5.11)
RDW: 13.1 % (ref 11.5–15.5)
WBC: 8.4 10*3/uL (ref 4.0–10.5)

## 2016-04-05 LAB — URINALYSIS, ROUTINE W REFLEX MICROSCOPIC
Bilirubin Urine: NEGATIVE
Hgb urine dipstick: NEGATIVE
Ketones, ur: NEGATIVE
Leukocytes, UA: NEGATIVE
NITRITE: NEGATIVE
PH: 6 (ref 5.0–8.0)
RBC / HPF: NONE SEEN (ref 0–?)
SPECIFIC GRAVITY, URINE: 1.02 (ref 1.000–1.030)
TOTAL PROTEIN, URINE-UPE24: NEGATIVE
URINE GLUCOSE: NEGATIVE
Urobilinogen, UA: 0.2 — AB (ref 0.0–1.0)

## 2016-04-05 LAB — TSH: TSH: 3.93 u[IU]/mL (ref 0.35–4.50)

## 2016-04-05 NOTE — Progress Notes (Signed)
Pre visit review using our clinic review tool, if applicable. No additional management support is needed unless otherwise documented below in the visit note. 

## 2016-04-05 NOTE — Patient Instructions (Addendum)
Please continue all other medications as before including finishing the antibiotix and refills have been done if requested.  Please have the pharmacy call with any other refills you may need.  Please continue your efforts at being more active, low cholesterol diet, and weight control.  You are otherwise up to date with prevention measures today.  Please keep your appointments with your specialists as you may have planned  Please go to the LAB in the Basement (turn left off the elevator) for the tests to be done today  You will be contacted by phone if any changes need to be made immediately.  Otherwise, you will receive a letter about your results with an explanation, but please check with MyChart first.  Please remember to sign up for MyChart if you have not done so, as this will be important to you in the future with finding out test results, communicating by private email, and scheduling acute appointments online when needed.  Please return in 1 year for your yearly visit, or sooner if needed, with Lab testing done 3-5 days before

## 2016-04-05 NOTE — Progress Notes (Signed)
Subjective:    Patient ID: Lauren Fox, female    DOB: 03/21/1966, 50 y.o.   MRN: [id number]  HPI  Here for wellness and f/u;  Overall doing ok;  Pt denies Chest pain, worsening SOB, DOE, wheezing, orthopnea, PND, worsening LE edema, palpitations, dizziness or syncope.  Pt denies neurological change such as new headache, facial or extremity weakness.  Pt denies polydipsia, polyuria, or low sugar symptoms. Pt states overall good compliance with treatment and medications, good tolerability, and has been trying to follow appropriate diet.  Pt denies worsening depressive symptoms, suicidal ideation or panic. No fever, night sweats, wt loss, loss of appetite, or other constitutional symptoms.  Pt states good ability with ADL's, has low fall risk, home safety reviewed and adequate, no other significant changes in hearing or vision, and only occasionally active with exercise. Currently under tx for sinusitis, overall improved, finishing antibx.  Does have several wks ongoing nasal allergy symptoms with clearish congestion, itch and sneezing, without fever, pain, ST, cough, swelling or wheezing. No other new history findings Wt Readings from Last 3 Encounters:  04/05/16 233 lb (105.7 kg)  03/28/16 231 lb 12.8 oz (105.1 kg)  07/31/15 237 lb (107.5 kg)   BP Readings from Last 3 Encounters:  04/05/16 136/74  03/28/16 122/86  07/31/15 126/82  Sees GYN regularly for pap and mammogram. Past Medical History:  Diagnosis Date  . ALLERGIC RHINITIS 11/25/2008  . DIZZINESS 11/25/2008  . FOOT PAIN, LEFT 12/19/2009  . GLUCOSE INTOLERANCE 11/25/2008  . Gout, unspecified 11/25/2008  . Headache(784.0) 06/22/2010  . HYPERLIPIDEMIA 11/25/2008  . Impaired glucose tolerance 08/24/2010  . Lumbar disc disease 02/21/2012  . Morbid obesity (HCC) 11/25/2008  . Unspecified backache 12/17/2009   Past Surgical History:  Procedure Laterality Date  . ABDOMINAL HYSTERECTOMY  20063   non malignant  . s/p pilonidal cyst    .  TUBAL LIGATION      reports that she quit smoking about 3 years ago. Her smoking use included Cigarettes. She does not have any smokeless tobacco history on file. She reports that she drinks alcohol. She reports that she does not use drugs. family history includes Coronary artery disease in her other; Diabetes in her father, other, and other; Glaucoma in her father; Heart attack in her other; Hyperlipidemia in her mother; Hypertension in her father and mother. No Known Allergies Current Outpatient Prescriptions on File Prior to Visit  Medication Sig Dispense Refill  . atorvastatin (LIPITOR) 10 MG tablet Take 1 tablet (10 mg total) by mouth daily. (Patient not taking: Reported on 03/28/2016) 90 tablet 3  . benzonatate (TESSALON) 100 MG capsule Take 1 capsule (100 mg total) by mouth 3 (three) times daily. 20 capsule 0  . fluticasone (FLONASE) 50 MCG/ACT nasal spray Place 2 sprays into both nostrils daily. 16 g 2  . meclizine (ANTIVERT) 25 MG tablet Take 1 tablet (25 mg total) by mouth 3 (three) times daily as needed for dizziness. (Patient not taking: Reported on 03/28/2016) 30 tablet 0  . naproxen (NAPROSYN) 500 MG tablet Take 1 tablet (500 mg total) by mouth 2 (two) times daily with a meal. As needed 60 tablet 5   No current facility-administered medications on file prior to visit.     Review of Systems Constitutional: Negative for increased diaphoresis, or other activity, appetite or siginficant weight change other than noted HENT: Negative for worsening hearing loss, ear pain, facial swelling, mouth sores and neck stiffness.   Eyes: Negative for other  worsening pain, redness or visual disturbance.  Respiratory: Negative for choking or stridor Cardiovascular: Negative for other chest pain and palpitations.  Gastrointestinal: Negative for worsening diarrhea, blood in stool, or abdominal distention Genitourinary: Negative for hematuria, flank pain or change in urine volume.  Musculoskeletal:  Negative for myalgias or other joint complaints.  Skin: Negative for other color change and wound or drainage.  Neurological: Negative for syncope and numbness. other than noted Hematological: Negative for adenopathy. or other swelling Psychiatric/Behavioral: Negative for hallucinations, SI, self-injury, decreased concentration or other worsening agitation.  All other system neg per pt    Objective:   Physical Exam BP 136/74   Pulse 69   Temp 98 F (36.7 C) (Oral)   Resp 20   Wt 233 lb (105.7 kg)   SpO2 98%   BMI 39.99 kg/m  VS noted,  Constitutional: Pt is oriented to person, place, and time. Appears well-developed and well-nourished, in no significant distress Head: Normocephalic and atraumatic  Eyes: Conjunctivae and EOM are normal. Pupils are equal, round, and reactive to light Bilat tm's with mild erythema.  Max sinus areas non tender.  Pharynx with mild erythema, no exudateRight Ear: External ear normal.  Left Ear: External ear normal Nose: Nose normal.  Mouth/Throat: Oropharynx is clear and moist  Neck: Normal range of motion. Neck supple. No JVD present. No tracheal deviation present or significant neck LA or mass Cardiovascular: Normal rate, regular rhythm, normal heart sounds and intact distal pulses.   Pulmonary/Chest: Effort normal and breath sounds without rales or wheezing  Abdominal: Soft. Bowel sounds are normal. NT. No HSM  Musculoskeletal: Normal range of motion. Exhibits no edema Lymphadenopathy: Has no cervical adenopathy.  Neurological: Pt is alert and oriented to person, place, and time. Pt has normal reflexes. No cranial nerve deficit. Motor grossly intact Skin: Skin is warm and dry. No rash noted or new ulcers Psychiatric:  Has normal mood and affect. Behavior is normal.  No other new exam findings       Assessment & Plan:

## 2016-04-10 NOTE — Assessment & Plan Note (Signed)

## 2016-04-10 NOTE — Assessment & Plan Note (Signed)
Mild to mod, for flonase asd,  to f/u any worsening symptoms or concerns  

## 2016-09-30 ENCOUNTER — Ambulatory Visit (INDEPENDENT_AMBULATORY_CARE_PROVIDER_SITE_OTHER): Payer: 59 | Admitting: Family Medicine

## 2016-09-30 ENCOUNTER — Encounter: Payer: Self-pay | Admitting: Family Medicine

## 2016-09-30 VITALS — BP 110/86 | HR 78 | Temp 98.5°F | Wt 232.4 lb

## 2016-09-30 DIAGNOSIS — J019 Acute sinusitis, unspecified: Secondary | ICD-10-CM | POA: Diagnosis not present

## 2016-09-30 MED ORDER — AMOXICILLIN-POT CLAVULANATE 875-125 MG PO TABS: 1.0000 | ORAL_TABLET | Freq: Two times a day (BID) | ORAL | 0 refills | Status: DC

## 2016-09-30 NOTE — Patient Instructions (Signed)
WE NOW OFFER   [facility] FAST TRACK!!!  SAME DAY Appointments for ACUTE CARE  Such as: Sprains, Injuries, cuts, abrasions, rashes, muscle pain, joint pain, back pain Colds, flu, sore throats, headache, allergies, cough, fever  Ear pain, sinus and eye infections Abdominal pain, nausea, vomiting, diarrhea, upset stomach Animal/insect bites  3 Easy Ways to Schedule: Walk-In Scheduling Call in scheduling Mychart Sign-up: [email or web address]         

## 2016-09-30 NOTE — Progress Notes (Signed)
Subjective:     Patient ID: Lauren Fox, female   DOB: 03/22/1966, 51 y.o.   MRN: [id number]  HPI Patient seen with onset couple days ago of some facial pain in her ethmoid and maxillary sinus region along with some yellowish mucus. No fever. Mild headaches. Increased malaise. She had similar process back in the fall was treated with Augmentin. She's tried some Allegra and DayQuil without much improvement. No cough. No hoarseness. No known drug allergies.  Past Medical History:  Diagnosis Date  . ALLERGIC RHINITIS 11/25/2008  . DIZZINESS 11/25/2008  . FOOT PAIN, LEFT 12/19/2009  . GLUCOSE INTOLERANCE 11/25/2008  . Gout, unspecified 11/25/2008  . Headache(784.0) 06/22/2010  . HYPERLIPIDEMIA 11/25/2008  . Impaired glucose tolerance 08/24/2010  . Lumbar disc disease 02/21/2012  . Morbid obesity (HCC) 11/25/2008  . Unspecified backache 12/17/2009   Past Surgical History:  Procedure Laterality Date  . ABDOMINAL HYSTERECTOMY  20063   non malignant  . s/p pilonidal cyst    . TUBAL LIGATION      reports that she quit smoking about 4 years ago. Her smoking use included Cigarettes. She has quit using smokeless tobacco. She reports that she drinks alcohol. She reports that she does not use drugs. family history includes Coronary artery disease in her other; Diabetes in her father, other, and other; Glaucoma in her father; Heart attack in her other; Hyperlipidemia in her mother; Hypertension in her father and mother. No Known Allergies   Review of Systems  Constitutional: Positive for fatigue. Negative for chills and fever.  HENT: Positive for congestion and sinus pressure.   Respiratory: Negative for cough.        Objective:   Physical Exam  Constitutional: She appears well-developed and well-nourished.  HENT:  Right Ear: External ear normal.  Left Ear: External ear normal.  Mouth/Throat: Oropharynx is clear and moist.  Cardiovascular: Normal rate and regular rhythm.   Pulmonary/Chest:  Effort normal and breath sounds normal. No respiratory distress. She has no wheezes. She has no rales.       Assessment:     Acute sinus symptoms.    Plan:     -We recommended increased hydration, saline nasal irrigation, consider over-the-counter plain Mucinex -If symptoms not improving over the next week consider Augmentin 875 mg twice daily for 10 days  Kristian CoveyBruce W Annagrace Carr MD [facility]

## 2017-03-18 ENCOUNTER — Other Ambulatory Visit: Payer: Self-pay | Admitting: Internal Medicine

## 2017-04-10 ENCOUNTER — Encounter: Payer: Self-pay | Admitting: Internal Medicine

## 2017-04-12 ENCOUNTER — Other Ambulatory Visit (INDEPENDENT_AMBULATORY_CARE_PROVIDER_SITE_OTHER): Payer: 59

## 2017-04-12 ENCOUNTER — Ambulatory Visit (INDEPENDENT_AMBULATORY_CARE_PROVIDER_SITE_OTHER): Payer: 59 | Admitting: Internal Medicine

## 2017-04-12 ENCOUNTER — Encounter: Payer: Self-pay | Admitting: Internal Medicine

## 2017-04-12 VITALS — BP 122/86 | HR 65 | Temp 98.2°F | Ht 64.0 in | Wt 235.0 lb

## 2017-04-12 DIAGNOSIS — J309 Allergic rhinitis, unspecified: Secondary | ICD-10-CM

## 2017-04-12 DIAGNOSIS — Z114 Encounter for screening for human immunodeficiency virus [HIV]: Secondary | ICD-10-CM

## 2017-04-12 DIAGNOSIS — H6692 Otitis media, unspecified, left ear: Secondary | ICD-10-CM

## 2017-04-12 DIAGNOSIS — R7302 Impaired glucose tolerance (oral): Secondary | ICD-10-CM

## 2017-04-12 DIAGNOSIS — Z0000 Encounter for general adult medical examination without abnormal findings: Secondary | ICD-10-CM | POA: Diagnosis not present

## 2017-04-12 DIAGNOSIS — Z0001 Encounter for general adult medical examination with abnormal findings: Secondary | ICD-10-CM | POA: Diagnosis not present

## 2017-04-12 DIAGNOSIS — M519 Unspecified thoracic, thoracolumbar and lumbosacral intervertebral disc disorder: Secondary | ICD-10-CM

## 2017-04-12 LAB — TSH: TSH: 2.87 u[IU]/mL (ref 0.35–4.50)

## 2017-04-12 LAB — LIPID PANEL
CHOL/HDL RATIO: 5
Cholesterol: 219 mg/dL — ABNORMAL HIGH (ref 0–200)
HDL: 41.5 mg/dL (ref >39.00)
LDL CALC: 159 mg/dL — AB (ref 0–99)
NonHDL: 177.4
Triglycerides: 94 mg/dL (ref 0.0–149.0)
VLDL: 18.8 mg/dL (ref 0.0–40.0)

## 2017-04-12 LAB — CBC WITH DIFFERENTIAL/PLATELET
BASOS ABS: 0.1 10*3/uL (ref 0.0–0.1)
Basophils Relative: 1.8 % (ref 0.0–3.0)
Eosinophils Absolute: 0.4 10*3/uL (ref 0.0–0.7)
Eosinophils Relative: 6.3 % — ABNORMAL HIGH (ref 0.0–5.0)
HEMATOCRIT: 42.8 % (ref 36.0–46.0)
Hemoglobin: 14.2 g/dL (ref 12.0–15.0)
LYMPHS PCT: 48.8 % — AB (ref 12.0–46.0)
Lymphs Abs: 3.4 10*3/uL (ref 0.7–4.0)
MCHC: 33.2 g/dL (ref 30.0–36.0)
MCV: 93.8 fl (ref 78.0–100.0)
MONOS PCT: 7 % (ref 3.0–12.0)
Monocytes Absolute: 0.5 10*3/uL (ref 0.1–1.0)
NEUTROS ABS: 2.5 10*3/uL (ref 1.4–7.7)
Neutrophils Relative %: 36.1 % — ABNORMAL LOW (ref 43.0–77.0)
Platelets: 247 10*3/uL (ref 150.0–400.0)
RBC: 4.57 Mil/uL (ref 3.87–5.11)
RDW: 12.9 % (ref 11.5–15.5)
WBC: 7 10*3/uL (ref 4.0–10.5)

## 2017-04-12 LAB — HEPATIC FUNCTION PANEL
ALBUMIN: 4.7 g/dL (ref 3.5–5.2)
ALK PHOS: 60 U/L (ref 39–117)
ALT: 17 U/L (ref 0–35)
AST: 15 U/L (ref 0–37)
Bilirubin, Direct: 0.1 mg/dL (ref 0.0–0.3)
Total Bilirubin: 0.4 mg/dL (ref 0.2–1.2)
Total Protein: 7.7 g/dL (ref 6.0–8.3)

## 2017-04-12 LAB — URINALYSIS, ROUTINE W REFLEX MICROSCOPIC
Bilirubin Urine: NEGATIVE
HGB URINE DIPSTICK: NEGATIVE
Ketones, ur: NEGATIVE
Nitrite: NEGATIVE
PH: 5.5 (ref 5.0–8.0)
RBC / HPF: NONE SEEN (ref 0–?)
TOTAL PROTEIN, URINE-UPE24: NEGATIVE
URINE GLUCOSE: NEGATIVE
UROBILINOGEN UA: 0.2 (ref 0.0–1.0)

## 2017-04-12 LAB — BASIC METABOLIC PANEL
BUN: 12 mg/dL (ref 6–23)
CHLORIDE: 105 meq/L (ref 96–112)
CO2: 27 meq/L (ref 19–32)
CREATININE: 0.98 mg/dL (ref 0.40–1.20)
Calcium: 10.2 mg/dL (ref 8.4–10.5)
GFR: 77.58 mL/min (ref >60.00)
Glucose, Bld: 86 mg/dL (ref 70–99)
Potassium: 4.3 mEq/L (ref 3.5–5.1)
Sodium: 140 mEq/L (ref 135–145)

## 2017-04-12 LAB — HEMOGLOBIN A1C: HEMOGLOBIN A1C: 5.5 % (ref 4.6–6.5)

## 2017-04-12 MED ORDER — NAPROXEN 500 MG PO TABS: ORAL_TABLET | ORAL | 5 refills | Status: DC

## 2017-04-12 MED ORDER — AMOXICILLIN-POT CLAVULANATE 875-125 MG PO TABS: 1.0000 | ORAL_TABLET | Freq: Two times a day (BID) | ORAL | 0 refills | Status: DC

## 2017-04-12 NOTE — Patient Instructions (Signed)
Please take all new medication as prescribed - the antibiotic  You should also try OTC Allegra and Nasacort for allergies  Please continue all other medications as before, and refills have been done if requested.  Please have the pharmacy call with any other refills you may need.  Please continue your efforts at being more active, low cholesterol diet, and weight control.  You are otherwise up to date with prevention measures today.  Please keep your appointments with your specialists as you may have planned  Please go to the LAB in the Basement (turn left off the elevator) for the tests to be done today  You will be contacted by phone if any changes need to be made immediately.  Otherwise, you will receive a letter about your results with an explanation, but please check with MyChart first.  Please remember to sign up for MyChart if you have not done so, as this will be important to you in the future with finding out test results, communicating by private email, and scheduling acute appointments online when needed.  Please return in 1 year for your yearly visit, or sooner if needed, with Lab testing done 3-5 days before

## 2017-04-12 NOTE — Progress Notes (Signed)
Subjective:    Patient ID: Lauren Fox, female    DOB: 11/20/1965, 51 y.o.   MRN: [id number]  HPI  Here for wellness and f/u;  Overall doing ok;  Pt denies Chest pain, worsening SOB, DOE, wheezing, orthopnea, PND, worsening LE edema, palpitations, dizziness or syncope.  Pt denies neurological change such as new headache, facial or extremity weakness.  Pt denies polydipsia, polyuria, or low sugar symptoms. Pt states overall good compliance with treatment and medications, good tolerability, and has been trying to follow appropriate diet.  Pt denies worsening depressive symptoms, suicidal ideation or panic. No fever, night sweats, wt loss, loss of appetite, or other constitutional symptoms.  Pt states good ability with ADL's, has low fall risk, home safety reviewed and adequate, no other significant changes in hearing or vision, and only occasionally active with exercise  Declines flu shot. Also c/o left ear pain, pressure, feverish and muffled hearing for 3 days, nothing makes better or worse. Does have several wks ongoing nasal allergy symptoms with clearish congestion, itch and sneezing, without fever, pain, ST, cough, swelling or wheezing.  Pt continues to have recurring LBP without change in severity, bowel or bladder change, fever, wt loss,  worsening LE pain/numbness/weakness, gait change or falls  No other new complaints or interval hx Past Medical History:  Diagnosis Date  . ALLERGIC RHINITIS 11/25/2008  . DIZZINESS 11/25/2008  . FOOT PAIN, LEFT 12/19/2009  . GLUCOSE INTOLERANCE 11/25/2008  . Gout, unspecified 11/25/2008  . Headache(784.0) 06/22/2010  . HYPERLIPIDEMIA 11/25/2008  . Impaired glucose tolerance 08/24/2010  . Lumbar disc disease 02/21/2012  . Morbid obesity (HCC) 11/25/2008  . Unspecified backache 12/17/2009   Past Surgical History:  Procedure Laterality Date  . ABDOMINAL HYSTERECTOMY  20063   non malignant  . s/p pilonidal cyst    . TUBAL LIGATION      reports that she quit  smoking about 4 years ago. Her smoking use included cigarettes. She has quit using smokeless tobacco. She reports that she drinks alcohol. She reports that she does not use drugs. family history includes Coronary artery disease in her other; Diabetes in her father, other, and other; Glaucoma in her father; Heart attack in her other; Hyperlipidemia in her mother; Hypertension in her father and mother. No Known Allergies Current Outpatient Medications on File Prior to Visit  Medication Sig Dispense Refill  . meclizine (ANTIVERT) 25 MG tablet Take 1 tablet (25 mg total) by mouth 3 (three) times daily as needed for dizziness. 30 tablet 0   No current facility-administered medications on file prior to visit.    Review of Systems Constitutional: Negative for other unusual diaphoresis, sweats, appetite or weight changes HENT: Negative for other worsening hearing loss, ear pain, facial swelling, mouth sores or neck stiffness.   Eyes: Negative for other worsening pain, redness or other visual disturbance.  Respiratory: Negative for other stridor or swelling Cardiovascular: Negative for other palpitations or other chest pain  Gastrointestinal: Negative for worsening diarrhea or loose stools, blood in stool, distention or other pain Genitourinary: Negative for hematuria, flank pain or other change in urine volume.  Musculoskeletal: Negative for myalgias or other joint swelling.  Skin: Negative for other color change, or other wound or worsening drainage.  Neurological: Negative for other syncope or numbness. Hematological: Negative for other adenopathy or swelling Psychiatric/Behavioral: Negative for hallucinations, other worsening agitation, SI, self-injury, or new decreased concentration \\All  other system neg per pt    Objective:   Physical Exam BP  122/86   Pulse 65   Temp 98.2 F (36.8 C) (Oral)   Ht 5\' 4"  (1.626 m)   Wt 235 lb (106.6 kg)   SpO2 100%   BMI 40.34 kg/m  VS noted,    Constitutional: Pt is oriented to person, place, and time. Appears well-developed and well-nourished, in no significant distress and comfortable Head: Normocephalic and atraumatic  Eyes: Conjunctivae and EOM are normal. Pupils are equal, round, and reactive to light Right Ear: External ear normal without discharge Left Ear: External ear normal without discharge but left TM with severe erythema, mild bulging Bilat tm's with mild erythema.  Max sinus areas non tender.  Pharynx with mild erythema, no exudate Nose: Nose without discharge or deformity Mouth/Throat: Oropharynx is without other ulcerations and moist  Neck: Normal range of motion. Neck supple. No JVD present. No tracheal deviation present or significant neck LA or mass Cardiovascular: Normal rate, regular rhythm, normal heart sounds and intact distal pulses.   Pulmonary/Chest: WOB normal and breath sounds without rales or wheezing  Abdominal: Soft. Bowel sounds are normal. NT. No HSM  Musculoskeletal: Normal range of motion. Exhibits no edema Lymphadenopathy: Has no other cervical adenopathy.  Neurological: Pt is alert and oriented to person, place, and time. Pt has normal reflexes. No cranial nerve deficit. Motor grossly intact, Gait intact Skin: Skin is warm and dry. No rash noted or new ulcerations Psychiatric:  Has normal mood and affect. Behavior is normal without agitation     Assessment & Plan:

## 2017-04-13 LAB — HIV ANTIBODY (ROUTINE TESTING W REFLEX): HIV 1&2 Ab, 4th Generation: NONREACTIVE

## 2017-04-15 ENCOUNTER — Encounter: Payer: Self-pay | Admitting: Internal Medicine

## 2017-04-15 NOTE — Assessment & Plan Note (Signed)
Mild to mod, for allegra and nasaocrt asd, to f/u any worsening symptoms or concerns

## 2017-04-15 NOTE — Assessment & Plan Note (Signed)
Mild, chronic recurrent pain, for pain control, to f/u any worsening symptoms or concerns

## 2017-04-15 NOTE — Assessment & Plan Note (Signed)
stable overall by history and exam, recent data reviewed with pt, and pt to continue medical treatment as before,  to f/u any worsening symptoms or concerns Lab Results  Component Value Date   HGBA1C 5.5 04/12/2017

## 2017-04-15 NOTE — Assessment & Plan Note (Signed)

## 2017-04-15 NOTE — Assessment & Plan Note (Signed)
Mild to mod, for antibx course,  to f/u any worsening symptoms or concerns  In addition to the time spent performing CPE, I spent an additional 25 minutes face to face,in which greater than 50% of this time was spent in counseling and coordination of care for patient's acute illness as documented,  Including the differential dx, tx, further eavluation and other management of left ear pain, allergies, low back pain

## 2017-07-28 IMAGING — US US ABDOMEN LIMITED
1 series · 14 of 25 positions shown · non-contrast
Comparison: None.

CLINICAL DATA: Right upper quadrant pain for 2 weeks

EXAM:
US ABDOMEN LIMITED - RIGHT UPPER QUADRANT

[Series 1: us abdomen limited · 0.22mm/px · 14 of 63 slices shown]
[im 1/63]
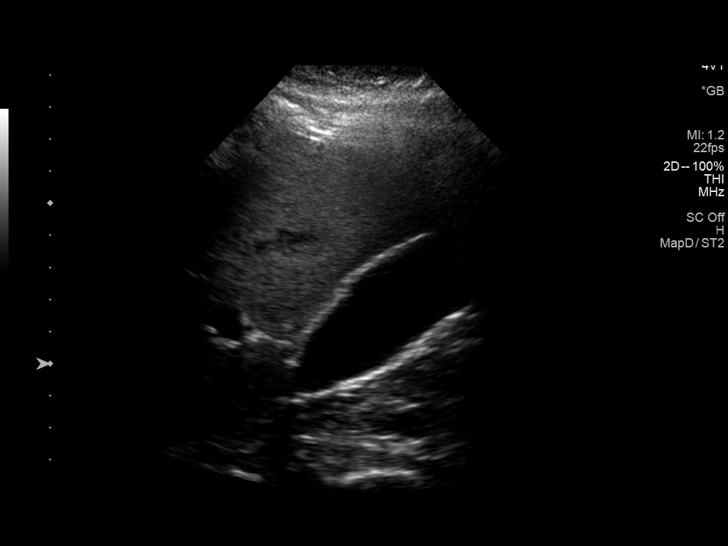
[im 6/63]
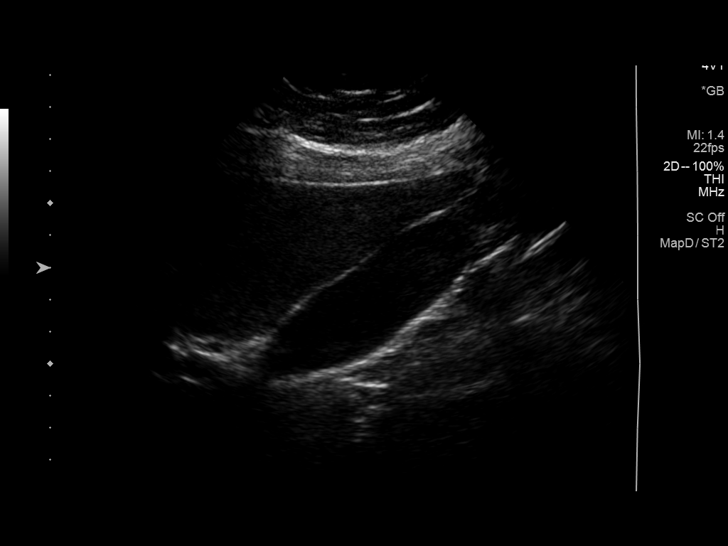
[im 11/63]
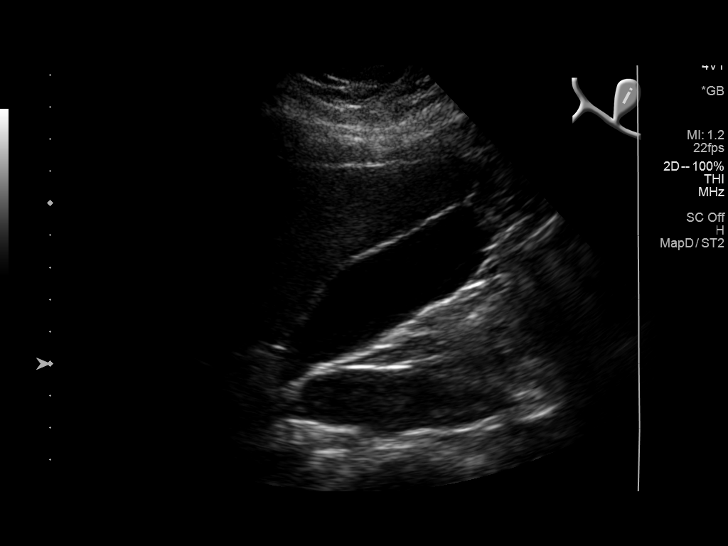
[im 16/63]
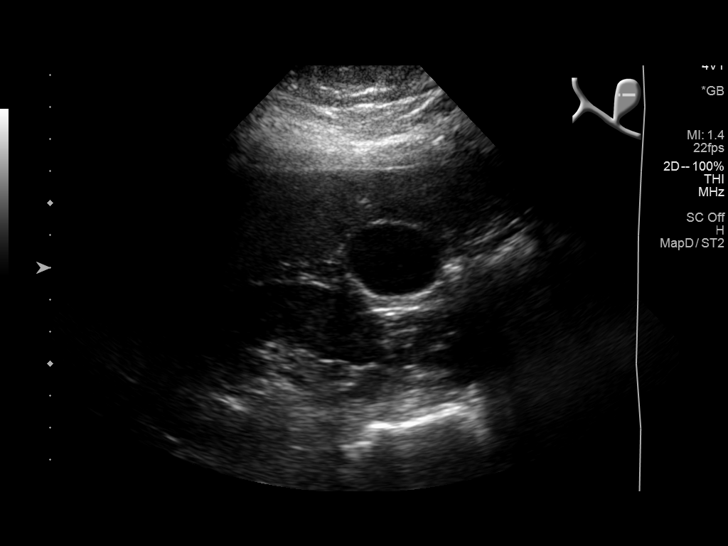
[im 21/63]
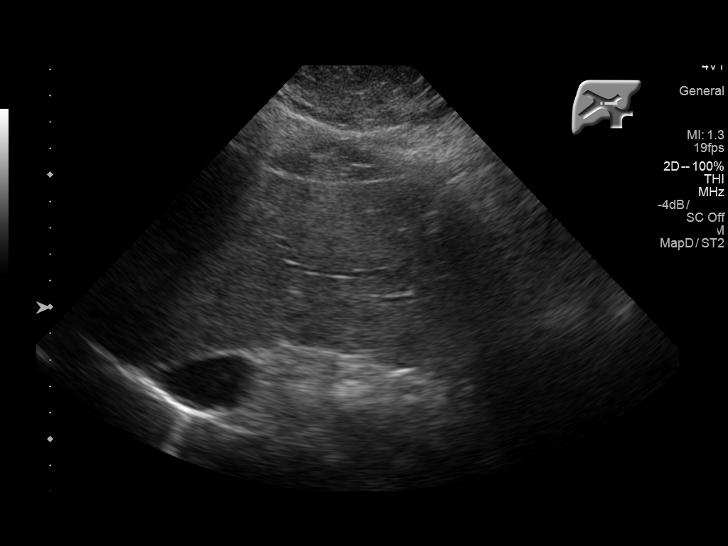
[im 24/63]
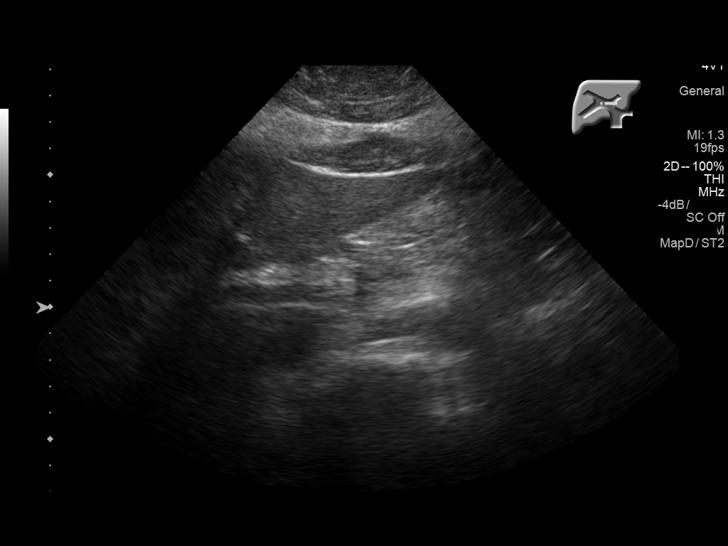
[im 29/63]
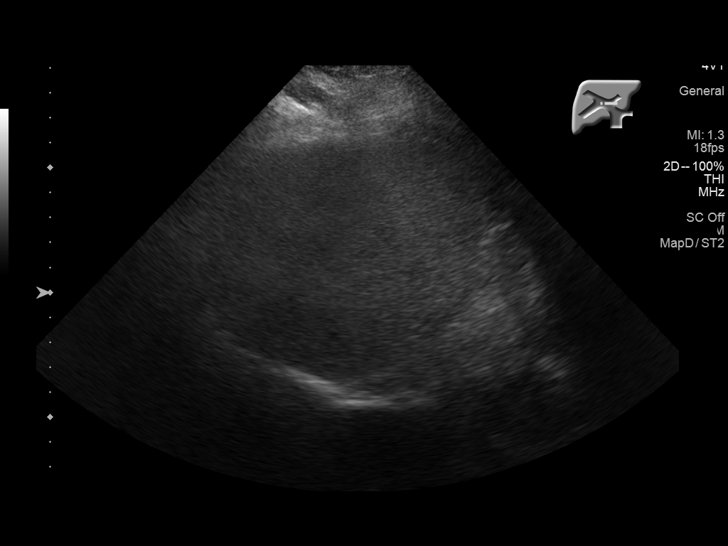
[im 34/63]
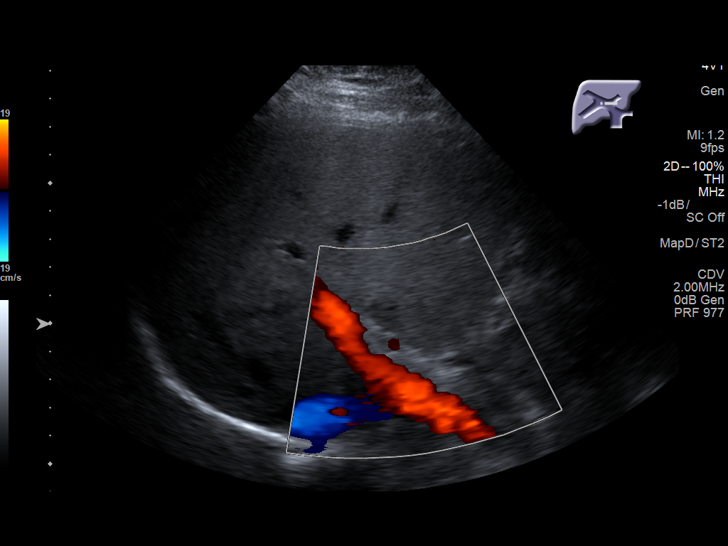
[im 39/63]
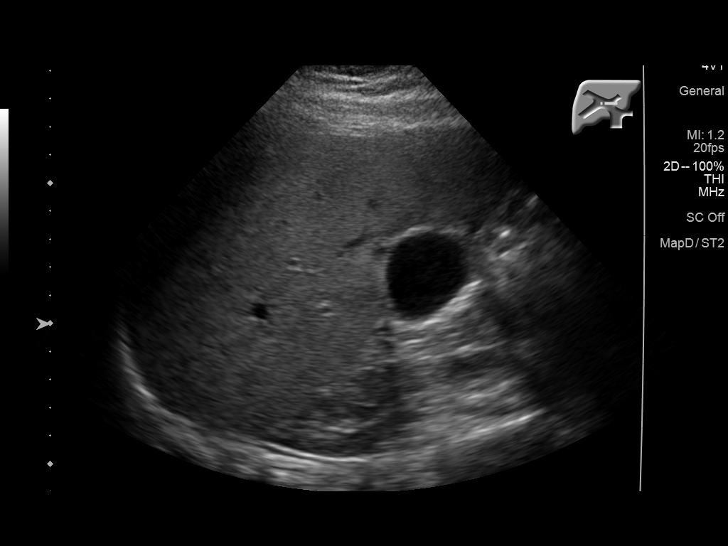
[im 42/63]
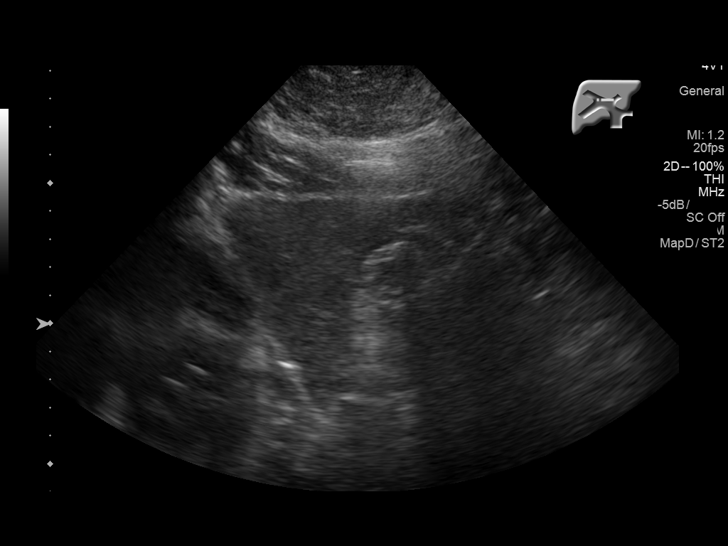
[im 47/63]
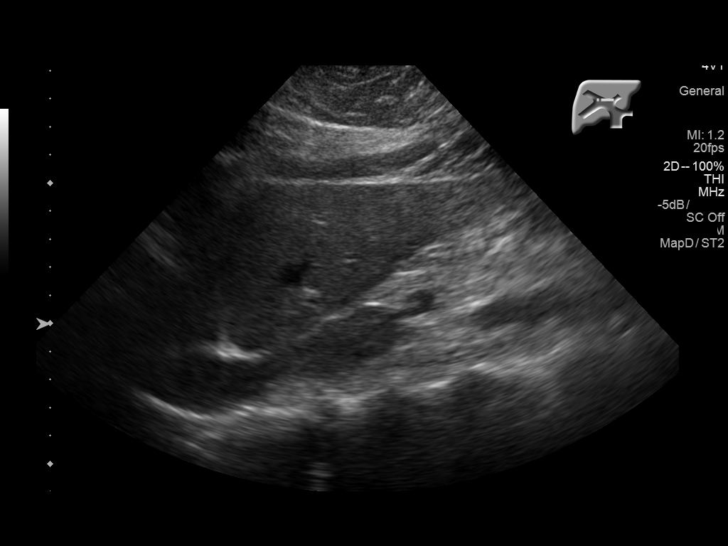
[im 52/63]
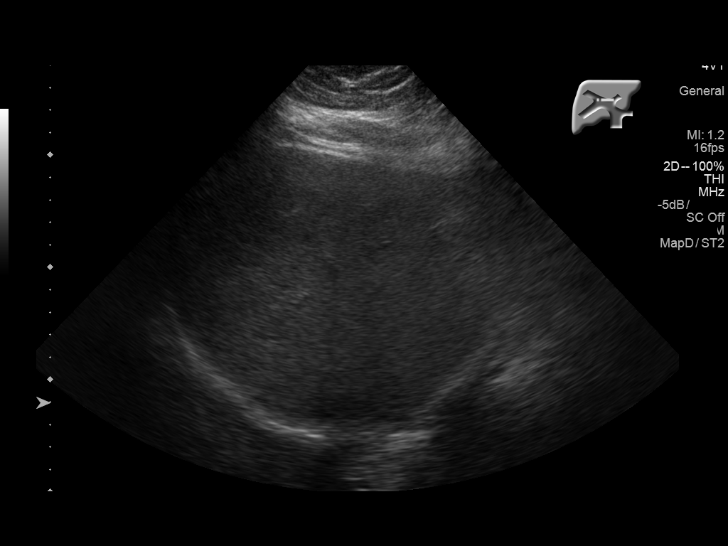
[im 57/63]
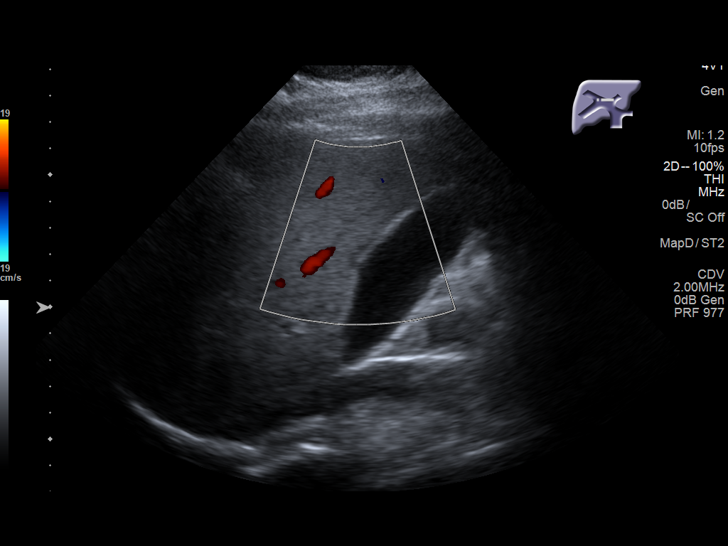
[im 63/63]
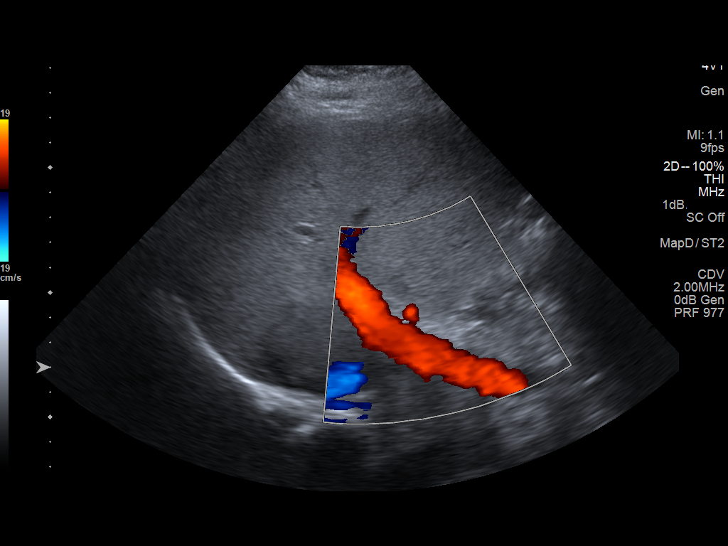

[14 of 25 positions shown; findings below may reference images not displayed]

FINDINGS: Gallbladder:

No gallstones or wall thickening visualized. No sonographic Murphy
sign noted by sonographer.

Common bile duct:

Diameter: 4.6 mm.

Liver:

Generalized increased echogenicity is noted consistent with fatty
infiltration. Areas of likely focal fatty sparing are noted adjacent
to the gallbladder fossa and main portal vein.
IMPRESSION: Fatty infiltration of the liver.  No acute abnormality noted.

## 2017-09-04 DIAGNOSIS — M654 Radial styloid tenosynovitis [de Quervain]: Secondary | ICD-10-CM | POA: Insufficient documentation

## 2017-09-22 ENCOUNTER — Other Ambulatory Visit: Payer: Self-pay | Admitting: Internal Medicine

## 2017-09-25 MED ORDER — MECLIZINE HCL 25 MG PO TABS: 25.0000 mg | ORAL_TABLET | Freq: Three times a day (TID) | ORAL | 5 refills | Status: DC | PRN

## 2017-10-23 ENCOUNTER — Telehealth: Payer: Self-pay | Admitting: Internal Medicine

## 2017-10-23 NOTE — Telephone Encounter (Signed)
I am not currently accepting new patients.  We do have other female providers in the office.

## 2017-10-23 NOTE — Telephone Encounter (Signed)
Copied from CRM [id number]. Topic: Appointment Scheduling - Scheduling Inquiry for Clinic >> Oct 23, 2017 12:24 PM Oneal GroutSebastian, Jennifer S wrote: Reason for CRM: Requesting to transfer care from Dr Jonny RuizJohn to Dr Lawerance BachBurns, prefers female.  Please advise if ok.

## 2017-10-23 NOTE — Telephone Encounter (Signed)
Pt informed. Will stay with Lauren RuizJohn.

## 2018-01-18 ENCOUNTER — Ambulatory Visit (INDEPENDENT_AMBULATORY_CARE_PROVIDER_SITE_OTHER): Payer: 59

## 2018-01-18 ENCOUNTER — Ambulatory Visit (INDEPENDENT_AMBULATORY_CARE_PROVIDER_SITE_OTHER): Payer: 59 | Admitting: Podiatry

## 2018-01-18 ENCOUNTER — Encounter: Payer: Self-pay | Admitting: Podiatry

## 2018-01-18 VITALS — BP 128/63 | HR 78 | Resp 16

## 2018-01-18 DIAGNOSIS — R61 Generalized hyperhidrosis: Secondary | ICD-10-CM | POA: Insufficient documentation

## 2018-01-18 DIAGNOSIS — N951 Menopausal and female climacteric states: Secondary | ICD-10-CM | POA: Insufficient documentation

## 2018-01-18 DIAGNOSIS — M722 Plantar fascial fibromatosis: Secondary | ICD-10-CM | POA: Diagnosis not present

## 2018-01-18 MED ORDER — MELOXICAM 15 MG PO TABS: 15.0000 mg | ORAL_TABLET | Freq: Every day | ORAL | 3 refills | Status: DC

## 2018-01-18 MED ORDER — METHYLPREDNISOLONE 4 MG PO TBPK: ORAL_TABLET | ORAL | 0 refills | Status: DC

## 2018-01-18 NOTE — Patient Instructions (Signed)
For instructions on how to put on your Plantar Fascial Brace, please visit [email or web address] For instructions on how to put on your Night Splint, please visit [email or web address]   Plantar Fasciitis (Heel Spur Syndrome) with Rehab The plantar fascia is a fibrous, ligament-like, soft-tissue structure that spans the bottom of the foot. Plantar fasciitis is a condition that causes pain in the foot due to inflammation of the tissue. SYMPTOMS   Pain and tenderness on the underneath side of the foot.  Pain that worsens with standing or walking. CAUSES  Plantar fasciitis is caused by irritation and injury to the plantar fascia on the underneath side of the foot. Common mechanisms of injury include:  Direct trauma to bottom of the foot.  Damage to a small nerve that runs under the foot where the main fascia attaches to the heel bone.  Stress placed on the plantar fascia due to bone spurs. RISK INCREASES WITH:   Activities that place stress on the plantar fascia (running, jumping, pivoting, or cutting).  Poor strength and flexibility.  Improperly fitted shoes.  Tight calf muscles.  Flat feet.  Failure to warm-up properly before activity.  Obesity. PREVENTION  Warm up and stretch properly before activity.  Allow for adequate recovery between workouts.  Maintain physical fitness:  Strength, flexibility, and endurance.  Cardiovascular fitness.  Maintain a health body weight.  Avoid stress on the plantar fascia.  Wear properly fitted shoes, including arch supports for individuals who have flat feet.  PROGNOSIS  If treated properly, then the symptoms of plantar fasciitis usually resolve without surgery. However, occasionally surgery is necessary.  RELATED COMPLICATIONS   Recurrent symptoms that may result in a chronic condition.  Problems of the lower back that are caused by compensating for the injury, such as limping.  Pain or weakness of the foot during  push-off following surgery.  Chronic inflammation, scarring, and partial or complete fascia tear, occurring more often from repeated injections.  TREATMENT  Treatment initially involves the use of ice and medication to help reduce pain and inflammation. The use of strengthening and stretching exercises may help reduce pain with activity, especially stretches of the Achilles tendon. These exercises may be performed at home or with a therapist. Your caregiver may recommend that you use heel cups of arch supports to help reduce stress on the plantar fascia. Occasionally, corticosteroid injections are given to reduce inflammation. If symptoms persist for greater than 6 months despite non-surgical (conservative), then surgery may be recommended.   MEDICATION   If pain medication is necessary, then nonsteroidal anti-inflammatory medications, such as aspirin and ibuprofen, or other minor pain relievers, such as acetaminophen, are often recommended.  Do not take pain medication within 7 days before surgery.  Prescription pain relievers may be given if deemed necessary by your caregiver. Use only as directed and only as much as you need.  Corticosteroid injections may be given by your caregiver. These injections should be reserved for the most serious cases, because they may only be given a certain number of times.  HEAT AND COLD  Cold treatment (icing) relieves pain and reduces inflammation. Cold treatment should be applied for 10 to 15 minutes every 2 to 3 hours for inflammation and pain and immediately after any activity that aggravates your symptoms. Use ice packs or massage the area with a piece of ice (ice massage).  Heat treatment may be used prior to performing the stretching and strengthening activities prescribed by your caregiver, physical therapist, or athletic trainer. Use a   heat pack or soak the injury in warm water.  SEEK IMMEDIATE MEDICAL CARE IF:  Treatment seems to offer no benefit,  or the condition worsens.  Any medications produce adverse side effects.  EXERCISES- RANGE OF MOTION (ROM) AND STRETCHING EXERCISES - Plantar Fasciitis (Heel Spur Syndrome) These exercises may help you when beginning to rehabilitate your injury. Your symptoms may resolve with or without further involvement from your physician, physical therapist or athletic trainer. While completing these exercises, remember:   Restoring tissue flexibility helps normal motion to return to the joints. This allows healthier, less painful movement and activity.  An effective stretch should be held for at least 30 seconds.  A stretch should never be painful. You should only feel a gentle lengthening or release in the stretched tissue.  RANGE OF MOTION - Toe Extension, Flexion  Sit with your right / left leg crossed over your opposite knee.  Grasp your toes and gently pull them back toward the top of your foot. You should feel a stretch on the bottom of your toes and/or foot.  Hold this stretch for 10 seconds.  Now, gently pull your toes toward the bottom of your foot. You should feel a stretch on the top of your toes and or foot.  Hold this stretch for 10 seconds. Repeat  times. Complete this stretch 3 times per day.   RANGE OF MOTION - Ankle Dorsiflexion, Active Assisted  Remove shoes and sit on a chair that is preferably not on a carpeted surface.  Place right / left foot under knee. Extend your opposite leg for support.  Keeping your heel down, slide your right / left foot back toward the chair until you feel a stretch at your ankle or calf. If you do not feel a stretch, slide your bottom forward to the edge of the chair, while still keeping your heel down.  Hold this stretch for 10 seconds. Repeat 3 times. Complete this stretch 2 times per day.   STRETCH  Gastroc, Standing  Place hands on wall.  Extend right / left leg, keeping the front knee somewhat bent.  Slightly point your toes inward  on your back foot.  Keeping your right / left heel on the floor and your knee straight, shift your weight toward the wall, not allowing your back to arch.  You should feel a gentle stretch in the right / left calf. Hold this position for 10 seconds. Repeat 3 times. Complete this stretch 2 times per day.  STRETCH  Soleus, Standing  Place hands on wall.  Extend right / left leg, keeping the other knee somewhat bent.  Slightly point your toes inward on your back foot.  Keep your right / left heel on the floor, bend your back knee, and slightly shift your weight over the back leg so that you feel a gentle stretch deep in your back calf.  Hold this position for 10 seconds. Repeat 3 times. Complete this stretch 2 times per day.  STRETCH  Gastrocsoleus, Standing  Note: This exercise can place a lot of stress on your foot and ankle. Please complete this exercise only if specifically instructed by your caregiver.   Place the ball of your right / left foot on a step, keeping your other foot firmly on the same step.  Hold on to the wall or a rail for balance.  Slowly lift your other foot, allowing your body weight to press your heel down over the edge of the step.  You   should feel a stretch in your right / left calf.  Hold this position for 10 seconds.  Repeat this exercise with a slight bend in your right / left knee. Repeat 3 times. Complete this stretch 2 times per day.   STRENGTHENING EXERCISES - Plantar Fasciitis (Heel Spur Syndrome)  These exercises may help you when beginning to rehabilitate your injury. They may resolve your symptoms with or without further involvement from your physician, physical therapist or athletic trainer. While completing these exercises, remember:   Muscles can gain both the endurance and the strength needed for everyday activities through controlled exercises.  Complete these exercises as instructed by your physician, physical therapist or athletic  trainer. Progress the resistance and repetitions only as guided.  STRENGTH - Towel Curls  Sit in a chair positioned on a non-carpeted surface.  Place your foot on a towel, keeping your heel on the floor.  Pull the towel toward your heel by only curling your toes. Keep your heel on the floor. Repeat 3 times. Complete this exercise 2 times per day.  STRENGTH - Ankle Inversion  Secure one end of a rubber exercise band/tubing to a fixed object (table, pole). Loop the other end around your foot just before your toes.  Place your fists between your knees. This will focus your strengthening at your ankle.  Slowly, pull your big toe up and in, making sure the band/tubing is positioned to resist the entire motion.  Hold this position for 10 seconds.  Have your muscles resist the band/tubing as it slowly pulls your foot back to the starting position. Repeat 3 times. Complete this exercises 2 times per day.  Document Released: 05/02/2005 Document Revised: 07/25/2011 Document Reviewed: 08/14/2008 [employer] Patient Information 2014 [employer].  

## 2018-01-20 NOTE — Progress Notes (Signed)
  Subjective:  Patient ID: Lauren Fox, female    DOB: 1965-11-24,  MRN: [id number] HPI Chief Complaint  Patient presents with  . Foot Pain    Lateral heel and dorsal midfoot right - aching x few months, no injury, AM pain, takes naproxen-some help  . New Patient (Initial Visit)    52 y.o. female presents with the above complaint.   ROS: Denies fever chills nausea vomiting muscle aches pains calf pain back pain chest pain shortness of breath headache.  Past Medical History:  Diagnosis Date  . Acute low back pain 06/10/2013  . ALLERGIC RHINITIS 11/25/2008  . DIZZINESS 11/25/2008  . FOOT PAIN, LEFT 12/19/2009  . GLUCOSE INTOLERANCE 11/25/2008  . Gout, unspecified 11/25/2008  . Headache(784.0) 06/22/2010  . HYPERLIPIDEMIA 11/25/2008  . Impaired glucose tolerance 08/24/2010  . Lumbar disc disease 02/21/2012  . Morbid obesity (HCC) 11/25/2008  . Unspecified backache 12/17/2009   Past Surgical History:  Procedure Laterality Date  . ABDOMINAL HYSTERECTOMY  20063   non malignant  . s/p pilonidal cyst    . TUBAL LIGATION      Current Outpatient Medications:  .  meclizine (ANTIVERT) 25 MG tablet, Take 1 tablet (25 mg total) by mouth 3 (three) times daily as needed for dizziness., Disp: 30 tablet, Rfl: 5 .  meloxicam (MOBIC) 15 MG tablet, Take 1 tablet (15 mg total) by mouth daily., Disp: 30 tablet, Rfl: 3 .  methylPREDNISolone (MEDROL DOSEPAK) 4 MG TBPK tablet, 6 day dose pack - take as directed, Disp: 21 tablet, Rfl: 0 .  naproxen (NAPROSYN) 500 MG tablet, TAKE ONE TABLET BY MOUTH TWICE DAILY WITH MEALS AS NEEDED, Disp: 60 tablet, Rfl: 5  No Known Allergies Review of Systems Objective:   Vitals:   01/18/18 1449  BP: 128/63  Pulse: 78  Resp: 16    General: Well developed, nourished, in no acute distress, alert and oriented x3   Dermatological: Skin is warm, dry and supple bilateral. Nails x 10 are well maintained; remaining integument appears unremarkable at this time. There are  no open sores, no preulcerative lesions, no rash or signs of infection present.  Vascular: Dorsalis Pedis artery and Posterior Tibial artery pedal pulses are 2/4 bilateral with immedate capillary fill time. Pedal hair growth present. No varicosities and no lower extremity edema present bilateral.   Neruologic: Grossly intact via light touch bilateral. Vibratory intact via tuning fork bilateral. Protective threshold with Semmes Wienstein monofilament intact to all pedal sites bilateral. Patellar and Achilles deep tendon reflexes 2+ bilateral. No Babinski or clonus noted bilateral.   Musculoskeletal: No gross boney pedal deformities bilateral. No pain, crepitus, or limitation noted with foot and ankle range of motion bilateral. Muscular strength 5/5 in all groups tested bilateral.  Gait: Unassisted, Nonantalgic.    Radiographs:  Radiographs taken today demonstrate soft tissue increase in density plantar fashion calcaneal insertion site of the right heel.  No other acute findings.   Assessment & Plan:   Assessment: Plantar fasciitis right.    Plan: Discussed etiology pathology conservative surgical therapies at this point I injected her heel with 20 mg Kenalog 5 mg Marcaine point maximal tenderness of the right medial heel.  Tolerated procedure well without complications.  Start her on a Medrol Dosepak to be followed by Mobic also placed her plantar fascial brace and a night splint.  Discussed appropriate shoe gear stretching exercise ice therapy sugar modifications.     Shafiq Larch T. [place], [place]

## 2018-02-15 ENCOUNTER — Ambulatory Visit (INDEPENDENT_AMBULATORY_CARE_PROVIDER_SITE_OTHER): Payer: 59 | Admitting: Podiatry

## 2018-02-15 DIAGNOSIS — M722 Plantar fascial fibromatosis: Secondary | ICD-10-CM

## 2018-02-15 NOTE — Progress Notes (Signed)
She presents today for follow-up of her plantar fasciitis to the right foot with bilateral pitting edema.  She states that her right foot is 100% better continues to use a plantar fascial brace night splint the oral medication and good shoes.  Pitting edema has been present for quite some time states that is mildly more aggressive on the left side than that of the right.  States that she sits all day with her feet hanging down has no history of heart problems kidney problems diabetes but does relate venous stasis dermatitis and her mother.  Objective: Vital signs are stable she is alert and oriented x3.  Vital signs are stable she is alert and oriented x3 pulses are strongly palpable.  Neurologic sensorium is intact.  DP reflexes are intact.  Muscle strength is normal symmetrical bilateral.  Orthopedic evaluation demonstrates all joints distal ankle full range of motion without crepitus she has no pain on palpation medial calcaneal tubercle of the right heel left heel does demonstrate some swelling in the ankle area just superior and anterior to the heel itself.  But all in all there is no pain.  Assessment: Well-healing plantar fasciitis right.  Pitting edema bilaterally.  Left worse than the right.  Plan: Discussed etiology pathology and surgical therapies request that she continue all conservative therapies for the plantar fasciitis x1 month then discontinue as long as she is doing well.  I also recommend she follow-up with her primary provider to make sure that all of her systems are working normally and I will follow-up with her as needed.  Any regression orthotics will be our next treatment plan.

## 2018-03-12 ENCOUNTER — Other Ambulatory Visit: Payer: Self-pay | Admitting: Family

## 2018-03-12 ENCOUNTER — Encounter: Payer: Self-pay | Admitting: Internal Medicine

## 2018-03-12 DIAGNOSIS — R05 Cough: Secondary | ICD-10-CM

## 2018-03-12 DIAGNOSIS — R059 Cough, unspecified: Secondary | ICD-10-CM

## 2018-03-12 MED ORDER — BENZONATATE 100 MG PO CAPS: 100.0000 mg | ORAL_CAPSULE | Freq: Three times a day (TID) | ORAL | 0 refills | Status: DC

## 2018-03-12 NOTE — Telephone Encounter (Signed)
I have informed the patient that medication for the cough was sent to her pharmacy and advised her to been seen to address the swelling issue rather that waiting until the end of next month to discuss it with PCP. She agreed and was transferred to the front to schedule an OV.

## 2018-03-12 NOTE — Telephone Encounter (Signed)
I sent in [facility] for her; if she has persisting symptoms, would recommend being seen. I didn't really understand the rest of her message- is she okay seeing him about the swelling at the end of the month? If not, she can schedule something with him sooner.

## 2018-03-14 ENCOUNTER — Ambulatory Visit (INDEPENDENT_AMBULATORY_CARE_PROVIDER_SITE_OTHER): Payer: 59 | Admitting: Family

## 2018-03-14 ENCOUNTER — Encounter: Payer: Self-pay | Admitting: Family

## 2018-03-14 VITALS — BP 130/80 | HR 76 | Temp 98.4°F | Ht 64.0 in | Wt 239.1 lb

## 2018-03-14 DIAGNOSIS — R2242 Localized swelling, mass and lump, left lower limb: Secondary | ICD-10-CM

## 2018-03-14 MED ORDER — FLUTICASONE PROPIONATE 50 MCG/ACT NA SUSP: 2.0000 | Freq: Every day | NASAL | 6 refills | Status: DC

## 2018-03-14 NOTE — Progress Notes (Signed)
Lauren Fox is a 52 y.o. female with the following history as recorded in [employer]:  Patient Active Problem List   Diagnosis Date Noted  . Menopausal flushing 01/18/2018  . Night sweats 01/18/2018  . De Quervain's syndrome (tenosynovitis) 09/04/2017  . Left otitis media 04/12/2017  . RUQ pain 07/31/2015  . Nausea with vomiting 07/31/2015  . Right flank pain 07/31/2015  . Vertigo 10/25/2014  . Pain and swelling of ankle 02/23/2014  . Hyperprolactinemia (HCC) 02/21/2012  . Lumbar disc disease 02/21/2012  . Heart palpitations 02/21/2012  . Vitamin D deficiency 02/23/2011  . Impaired glucose tolerance 08/24/2010  . Encounter for well adult exam with abnormal findings 08/24/2010  . HYPERLIPIDEMIA 11/25/2008  . Gout, unspecified 11/25/2008  . Morbid obesity (HCC) 11/25/2008  . Allergic rhinitis 11/25/2008    Current Outpatient Medications  Medication Sig Dispense Refill  . benzonatate (TESSALON) 100 MG capsule Take 1 capsule (100 mg total) by mouth 3 (three) times daily. 20 capsule 0  . meclizine (ANTIVERT) 25 MG tablet Take 1 tablet (25 mg total) by mouth 3 (three) times daily as needed for dizziness. 30 tablet 5  . meloxicam (MOBIC) 15 MG tablet Take 1 tablet (15 mg total) by mouth daily. 30 tablet 3  . naproxen (NAPROSYN) 500 MG tablet TAKE ONE TABLET BY MOUTH TWICE DAILY WITH MEALS AS NEEDED 60 tablet 5  . fluticasone (FLONASE) 50 MCG/ACT nasal spray Place 2 sprays into both nostrils daily. 16 g 6   No current facility-administered medications for this visit.     Allergies: Patient has no known allergies.  Past Medical History:  Diagnosis Date  . Acute low back pain 06/10/2013  . ALLERGIC RHINITIS 11/25/2008  . DIZZINESS 11/25/2008  . FOOT PAIN, LEFT 12/19/2009  . GLUCOSE INTOLERANCE 11/25/2008  . Gout, unspecified 11/25/2008  . Headache(784.0) 06/22/2010  . HYPERLIPIDEMIA 11/25/2008  . Impaired glucose tolerance 08/24/2010  . Lumbar disc disease 02/21/2012  . Morbid obesity  (HCC) 11/25/2008  . Unspecified backache 12/17/2009    Past Surgical History:  Procedure Laterality Date  . ABDOMINAL HYSTERECTOMY  20063   non malignant  . s/p pilonidal cyst    . TUBAL LIGATION      Family History  Problem Relation Age of Onset  . Hyperlipidemia Mother   . Hypertension Mother   . Glaucoma Father   . Hypertension Father   . Diabetes Father   . Diabetes Other   . Heart attack Other   . Coronary artery disease Other   . Diabetes Other     Social History   Tobacco Use  . Smoking status: Former Smoker    Types: Cigarettes    Last attempt to quit: 05/16/2012    Years since quitting: 5.8  . Smokeless tobacco: Former Engineer, water Use Topics  . Alcohol use: Yes    Alcohol/week: 0.0 standard drinks    Comment: social    Subjective:  Chronic problems with swelling in her left lower extremity x 2+ years; recently evaluated by foot doctor who recommended that she have her PCP check her out; no calf pain or swelling; area of concern is localized over left medial ankle; no prior history of injury; Denies any chest pain, shortness of breath, blurred vision or headache; admits that job requires sitting for extended periods of time/ not as active as she needs to be.    Objective:  Vitals:   03/14/18 1559  BP: 130/80  Pulse: 76  Temp: 98.4 F (36.9 C)  TempSrc: Oral  SpO2: 99%  Weight: 239 lb 1.3 oz (108.4 kg)  Height: 5\' 4"  (1.626 m)    General: Well developed, well nourished, in no acute distress  Skin : Warm and dry.  Head: Normocephalic and atraumatic  Lungs: Respirations unlabored; clear to auscultation bilaterally without wheeze, rales, rhonchi  CVS exam: normal rate and regular rhythm.  Musculoskeletal: No deformities; no active joint inflammation  Extremities: No pitting edema, cyanosis, clubbing; localized area of swelling on medial left ankle  Vessels: Symmetric bilaterally  Neurologic: Alert and oriented; speech intact; face symmetrical; moves all  extremities well; CNII-XII intact without focal deficit   Assessment:  1. Localized swelling of left foot     Plan:  Reassurance; suspect this could be some mild venous insufficiency; no concerns for DVT; discussed need to elevate feet at work, try to move more, compression stockings ( runner sleeves); do not feel imaging warranted; follow-up with her PCP as scheduled in November to re-evaluate.   No follow-ups on file.  No orders of the defined types were placed in this encounter.   Requested Prescriptions   Signed Prescriptions Disp Refills  . fluticasone (FLONASE) 50 MCG/ACT nasal spray 16 g 6    Sig: Place 2 sprays into both nostrils daily.

## 2018-04-11 ENCOUNTER — Other Ambulatory Visit: Payer: Self-pay | Admitting: Internal Medicine

## 2018-04-11 ENCOUNTER — Other Ambulatory Visit (INDEPENDENT_AMBULATORY_CARE_PROVIDER_SITE_OTHER): Payer: 59

## 2018-04-11 ENCOUNTER — Ambulatory Visit (INDEPENDENT_AMBULATORY_CARE_PROVIDER_SITE_OTHER): Payer: 59 | Admitting: Internal Medicine

## 2018-04-11 ENCOUNTER — Encounter: Payer: Self-pay | Admitting: Internal Medicine

## 2018-04-11 VITALS — BP 126/84 | HR 83 | Temp 98.3°F | Ht 64.0 in | Wt 242.0 lb

## 2018-04-11 DIAGNOSIS — R7302 Impaired glucose tolerance (oral): Secondary | ICD-10-CM

## 2018-04-11 DIAGNOSIS — Z0000 Encounter for general adult medical examination without abnormal findings: Secondary | ICD-10-CM

## 2018-04-11 DIAGNOSIS — E559 Vitamin D deficiency, unspecified: Secondary | ICD-10-CM

## 2018-04-11 LAB — CBC WITH DIFFERENTIAL/PLATELET
BASOS PCT: 1.5 % (ref 0.0–3.0)
Basophils Absolute: 0.1 10*3/uL (ref 0.0–0.1)
EOS ABS: 0.6 10*3/uL (ref 0.0–0.7)
EOS PCT: 6.2 % — AB (ref 0.0–5.0)
HEMATOCRIT: 42.3 % (ref 36.0–46.0)
HEMOGLOBIN: 14.4 g/dL (ref 12.0–15.0)
LYMPHS PCT: 33.8 % (ref 12.0–46.0)
Lymphs Abs: 3.1 10*3/uL (ref 0.7–4.0)
MCHC: 34 g/dL (ref 30.0–36.0)
MCV: 92.4 fl (ref 78.0–100.0)
Monocytes Absolute: 0.7 10*3/uL (ref 0.1–1.0)
Monocytes Relative: 7.2 % (ref 3.0–12.0)
NEUTROS ABS: 4.8 10*3/uL (ref 1.4–7.7)
Neutrophils Relative %: 51.3 % (ref 43.0–77.0)
PLATELETS: 230 10*3/uL (ref 150.0–400.0)
RBC: 4.58 Mil/uL (ref 3.87–5.11)
RDW: 13.5 % (ref 11.5–15.5)
WBC: 9.3 10*3/uL (ref 4.0–10.5)

## 2018-04-11 LAB — BASIC METABOLIC PANEL
BUN: 10 mg/dL (ref 6–23)
CHLORIDE: 102 meq/L (ref 96–112)
CO2: 24 mEq/L (ref 19–32)
Calcium: 9.7 mg/dL (ref 8.4–10.5)
Creatinine, Ser: 0.84 mg/dL (ref 0.40–1.20)
GFR: 92.31 mL/min (ref >60.00)
Glucose, Bld: 78 mg/dL (ref 70–99)
POTASSIUM: 4.1 meq/L (ref 3.5–5.1)
SODIUM: 137 meq/L (ref 135–145)

## 2018-04-11 LAB — URINALYSIS, ROUTINE W REFLEX MICROSCOPIC
Bilirubin Urine: NEGATIVE
Hgb urine dipstick: NEGATIVE
KETONES UR: NEGATIVE
Leukocytes, UA: NEGATIVE
Nitrite: NEGATIVE
PH: 5.5 (ref 5.0–8.0)
RBC / HPF: NONE SEEN (ref 0–?)
SPECIFIC GRAVITY, URINE: 1.025 (ref 1.000–1.030)
Total Protein, Urine: NEGATIVE
UROBILINOGEN UA: 0.2 (ref 0.0–1.0)
Urine Glucose: NEGATIVE

## 2018-04-11 LAB — LIPID PANEL
CHOLESTEROL: 202 mg/dL — AB (ref 0–200)
HDL: 52 mg/dL (ref >39.00)
LDL CALC: 126 mg/dL — AB (ref 0–99)
NonHDL: 149.54
Total CHOL/HDL Ratio: 4
Triglycerides: 118 mg/dL (ref 0.0–149.0)
VLDL: 23.6 mg/dL (ref 0.0–40.0)

## 2018-04-11 LAB — TSH: TSH: 6.09 u[IU]/mL — ABNORMAL HIGH (ref 0.35–4.50)

## 2018-04-11 LAB — HEPATIC FUNCTION PANEL
ALBUMIN: 4.5 g/dL (ref 3.5–5.2)
ALT: 15 U/L (ref 0–35)
AST: 16 U/L (ref 0–37)
Alkaline Phosphatase: 71 U/L (ref 39–117)
Bilirubin, Direct: 0.1 mg/dL (ref 0.0–0.3)
TOTAL PROTEIN: 7.5 g/dL (ref 6.0–8.3)
Total Bilirubin: 0.3 mg/dL (ref 0.2–1.2)

## 2018-04-11 LAB — HEMOGLOBIN A1C: HEMOGLOBIN A1C: 5.5 % (ref 4.6–6.5)

## 2018-04-11 LAB — VITAMIN D 25 HYDROXY (VIT D DEFICIENCY, FRACTURES): VITD: 7.62 ng/mL — ABNORMAL LOW (ref 30.00–100.00)

## 2018-04-11 MED ORDER — LEVOTHYROXINE SODIUM 25 MCG PO TABS: 25.0000 ug | ORAL_TABLET | Freq: Every day | ORAL | 3 refills | Status: DC

## 2018-04-11 MED ORDER — VITAMIN D (ERGOCALCIFEROL) 1.25 MG (50000 UNIT) PO CAPS: 50000.0000 [IU] | ORAL_CAPSULE | ORAL | 0 refills | Status: DC

## 2018-04-11 NOTE — Patient Instructions (Signed)

## 2018-04-11 NOTE — Progress Notes (Signed)
Subjective:    Patient ID: Lauren Fox, female    DOB: 06/16/1965, 52 y.o.   MRN: [id number]  HPI  Here for wellness and f/u;  Overall doing ok;  Pt denies Chest pain, worsening SOB, DOE, wheezing, orthopnea, PND, worsening LE edema, palpitations, dizziness or syncope.  Pt denies neurological change such as new headache, facial or extremity weakness.  Pt denies polydipsia, polyuria, or low sugar symptoms. Pt states overall good compliance with treatment and medications, good tolerability, and has been trying to follow appropriate diet.  Pt denies worsening depressive symptoms, suicidal ideation or panic. No fever, night sweats, wt loss, loss of appetite, or other constitutional symptoms.  Pt states good ability with ADL's, has low fall risk, home safety reviewed and adequate, no other significant changes in hearing or vision, and only occasionally active with exercise.  Has chronic small left foot and ankle swelling no change. No new complaints.  Plans to work on weight loss with wt watchers. Wt Readings from Last 3 Encounters:  04/11/18 242 lb (109.8 kg)  03/14/18 239 lb 1.3 oz (108.4 kg)  04/12/17 235 lb (106.6 kg)   Past Medical History:  Diagnosis Date  . Acute low back pain 06/10/2013  . ALLERGIC RHINITIS 11/25/2008  . DIZZINESS 11/25/2008  . FOOT PAIN, LEFT 12/19/2009  . GLUCOSE INTOLERANCE 11/25/2008  . Gout, unspecified 11/25/2008  . Headache(784.0) 06/22/2010  . HYPERLIPIDEMIA 11/25/2008  . Impaired glucose tolerance 08/24/2010  . Lumbar disc disease 02/21/2012  . Morbid obesity (HCC) 11/25/2008  . Unspecified backache 12/17/2009   Past Surgical History:  Procedure Laterality Date  . ABDOMINAL HYSTERECTOMY  20063   non malignant  . s/p pilonidal cyst    . TUBAL LIGATION      reports that she quit smoking about 5 years ago. Her smoking use included cigarettes. She has quit using smokeless tobacco. She reports that she drinks alcohol. She reports that she does not use drugs. family  history includes Coronary artery disease in her other; Diabetes in her father, other, and other; Glaucoma in her father; Heart attack in her other; Hyperlipidemia in her mother; Hypertension in her father and mother. No Known Allergies Current Outpatient Medications on File Prior to Visit  Medication Sig Dispense Refill  . benzonatate (TESSALON) 100 MG capsule Take 1 capsule (100 mg total) by mouth 3 (three) times daily. 20 capsule 0  . fluticasone (FLONASE) 50 MCG/ACT nasal spray Place 2 sprays into both nostrils daily. 16 g 6  . meclizine (ANTIVERT) 25 MG tablet Take 1 tablet (25 mg total) by mouth 3 (three) times daily as needed for dizziness. 30 tablet 5  . meloxicam (MOBIC) 15 MG tablet Take 1 tablet (15 mg total) by mouth daily. 30 tablet 3  . naproxen (NAPROSYN) 500 MG tablet TAKE ONE TABLET BY MOUTH TWICE DAILY WITH MEALS AS NEEDED 60 tablet 5   No current facility-administered medications on file prior to visit.    Review of Systems Constitutional: Negative for other unusual diaphoresis, sweats, appetite or weight changes HENT: Negative for other worsening hearing loss, ear pain, facial swelling, mouth sores or neck stiffness.   Eyes: Negative for other worsening pain, redness or other visual disturbance.  Respiratory: Negative for other stridor or swelling Cardiovascular: Negative for other palpitations or other chest pain  Gastrointestinal: Negative for worsening diarrhea or loose stools, blood in stool, distention or other pain Genitourinary: Negative for hematuria, flank pain or other change in urine volume.  Musculoskeletal: Negative for  myalgias or other joint swelling.  Skin: Negative for other color change, or other wound or worsening drainage.  Neurological: Negative for other syncope or numbness. Hematological: Negative for other adenopathy or swelling Psychiatric/Behavioral: Negative for hallucinations, other worsening agitation, SI, self-injury, or new decreased  concentration All other system neg per pt    Objective:   Physical Exam BP 126/84   Pulse 83   Temp 98.3 F (36.8 C) (Oral)   Ht 5\' 4"  (1.626 m)   Wt 242 lb (109.8 kg)   SpO2 98%   BMI 41.54 kg/m  VS noted,  Constitutional: Pt is oriented to person, place, and time. Appears well-developed and well-nourished, in no significant distress and comfortable Head: Normocephalic and atraumatic  Eyes: Conjunctivae and EOM are normal. Pupils are equal, round, and reactive to light Right Ear: External ear normal without discharge Left Ear: External ear normal without discharge Nose: Nose without discharge or deformity Mouth/Throat: Oropharynx is without other ulcerations and moist  Neck: Normal range of motion. Neck supple. No JVD present. No tracheal deviation present or significant neck LA or mass Cardiovascular: Normal rate, regular rhythm, normal heart sounds and intact distal pulses.   Pulmonary/Chest: WOB normal and breath sounds without rales or wheezing  Abdominal: Soft. Bowel sounds are normal. NT. No HSM  Musculoskeletal: Normal range of motion. Exhibits chronic trace left ankle edema Lymphadenopathy: Has no other cervical adenopathy.  Neurological: Pt is alert and oriented to person, place, and time. Pt has normal reflexes. No cranial nerve deficit. Motor grossly intact, Gait intact Skin: Skin is warm and dry. No rash noted or new ulcerations Psychiatric:  Has normal mood and affect. Behavior is normal without agitation No other exam findings Lab Results  Component Value Date   WBC 9.3 04/11/2018   HGB 14.4 04/11/2018   HCT 42.3 04/11/2018   PLT 230.0 04/11/2018   GLUCOSE 78 04/11/2018   CHOL 202 (H) 04/11/2018   TRIG 118.0 04/11/2018   HDL 52.00 04/11/2018   LDLCALC 126 (H) 04/11/2018   ALT 15 04/11/2018   AST 16 04/11/2018   NA 137 04/11/2018   K 4.1 04/11/2018   CL 102 04/11/2018   CREATININE 0.84 04/11/2018   BUN 10 04/11/2018   CO2 24 04/11/2018   TSH 6.09 (H)  04/11/2018   HGBA1C 5.5 04/11/2018   MICROALBUR 0.4 06/19/2009       Assessment & Plan:

## 2018-04-12 NOTE — Assessment & Plan Note (Signed)
Also for vit d level 

## 2018-04-12 NOTE — Assessment & Plan Note (Signed)
Also for a1c with labs 

## 2018-04-12 NOTE — Assessment & Plan Note (Signed)

## 2018-04-16 ENCOUNTER — Telehealth: Payer: Self-pay

## 2018-04-16 NOTE — Telephone Encounter (Signed)
-----   Message from Corwin LevinsJames W John, MD sent at 04/11/2018  8:15 PM EST ----- Left message on MyChart, pt to cont same tx except  The test results show that your current treatment is OK, except the thyroid test is consistent with very mild low thyroid, the Vitamin D is very low, and the LDL cholesterol is improved but still mildly elevated.  Please start an ongoing prescription of thyroid medication called Levothyroxine 25 mcg per day.  Also take a short term prescription of Vitamin D 50,000 units weekly for 12 weeks, and follow a lower cholesterol diet.  I will send the 2 prescriptions and you should hear from the office as well.  After the prescription Vit D is finished, please change to OTC Vitamin D3 2000 units per day.    Driana Dazey to please inform pt, I will do rx x 2

## 2018-04-16 NOTE — Telephone Encounter (Signed)
Pt has viewed results via MyChart  

## 2018-06-18 ENCOUNTER — Telehealth: Payer: Self-pay | Admitting: Internal Medicine

## 2018-06-18 NOTE — Telephone Encounter (Signed)
ok 

## 2018-06-18 NOTE — Telephone Encounter (Signed)
Copied from CRM [id number]. Topic: Quick Communication - See Telephone Encounter >> Jun 18, 2018  2:11 PM Jens Som A wrote: CRM for notification. See Telephone encounter for: 06/18/18.  Patient is calling to request a transfer of care from Dr. Ollen Bowl to Dr. Abbe Amsterdam at [facility]. Patient lives closer to [place]. Please advise [phone]

## 2018-06-18 NOTE — Telephone Encounter (Signed)
Ok with me 

## 2018-06-20 NOTE — Telephone Encounter (Signed)
Can someone please schedule transfer.  Thanks

## 2018-06-29 ENCOUNTER — Ambulatory Visit (INDEPENDENT_AMBULATORY_CARE_PROVIDER_SITE_OTHER): Payer: 59 | Admitting: Family Medicine

## 2018-06-29 ENCOUNTER — Encounter: Payer: Self-pay | Admitting: Family Medicine

## 2018-06-29 VITALS — BP 110/68 | HR 90 | Temp 98.3°F | Ht 64.0 in | Wt 239.0 lb

## 2018-06-29 DIAGNOSIS — R5383 Other fatigue: Secondary | ICD-10-CM

## 2018-06-29 DIAGNOSIS — Z1211 Encounter for screening for malignant neoplasm of colon: Secondary | ICD-10-CM

## 2018-06-29 DIAGNOSIS — R899 Unspecified abnormal finding in specimens from other organs, systems and tissues: Secondary | ICD-10-CM | POA: Diagnosis not present

## 2018-06-29 DIAGNOSIS — E559 Vitamin D deficiency, unspecified: Secondary | ICD-10-CM

## 2018-06-29 DIAGNOSIS — H6982 Other specified disorders of Eustachian tube, left ear: Secondary | ICD-10-CM

## 2018-06-29 DIAGNOSIS — J302 Other seasonal allergic rhinitis: Secondary | ICD-10-CM

## 2018-06-29 LAB — VITAMIN D 25 HYDROXY (VIT D DEFICIENCY, FRACTURES): VITD: 8.48 ng/mL — AB (ref 30.00–100.00)

## 2018-06-29 LAB — TSH: TSH: 2.32 u[IU]/mL (ref 0.35–4.50)

## 2018-06-29 LAB — T4, FREE: FREE T4: 0.79 ng/dL (ref 0.60–1.60)

## 2018-06-29 NOTE — Patient Instructions (Addendum)
Allergies, Adult An allergy means that your body reacts to something that bothers it (allergen). It is not a normal reaction. This can happen from something that you:  Eat.  Breathe in.  Touch. You can have an allergy (be allergic) to:  Outdoor things, like: ? Pollen. ? Grass. ? Weeds.  Indoor things, like: ? Dust. ? Smoke. ? Pet dander.  Foods.  Medicines.  Things that bother your skin, like: ? Detergents. ? Chemicals. ? Latex.  Perfume.  Bugs. An allergy cannot spread from person to person (is not contagious). Follow these instructions at home:         Stay away from things that you know you are allergic to.  If you have allergies to things in the air, wash out your nose each day. Do it with one of these: ? A salt-water (saline) spray. ? A container (neti pot).  Take over-the-counter and prescription medicines only as told by your doctor.  Keep all follow-up visits as told by your doctor. This is important.  If you are at risk for a very bad allergy reaction (anaphylaxis), keep an auto-injector with you all the time. This is called an epinephrine injection. ? This is pre-measured medicine with a needle. You can put it into your skin by yourself. ? Right after you have a very bad allergy reaction, you or a person with you must give the medicine in less than a few minutes. This is an emergency.  If you have ever had a very bad allergy reaction, wear a medical alert bracelet or necklace. Your very bad allergy should be written on it. Contact a health care provider if:  Your symptoms do not get better with treatment. Get help right away if:  You have symptoms of a very bad allergy reaction. These include: ? A swollen mouth, tongue, or throat. ? Pain or tightness in your chest. ? Trouble breathing. ? Being short of breath. ? Dizziness. ? Fainting. ? Very bad pain in your belly (abdomen). ? Throwing up (vomiting). ? Watery poop  (diarrhea). Summary  An allergy means that your body reacts to something that bothers it (allergen). It is not a normal reaction.  Stay away from things that make your body react.  Take over-the-counter and prescription medicines only as told by your doctor.  If you are at risk for a very bad allergy reaction, carry an auto-injector (epinephrine injection) all the time. Also, wear a medical alert bracelet or necklace so people know about your allergy. This information is not intended to replace advice given to you by your health care provider. Make sure you discuss any questions you have with your health care provider. Document Released: 08/27/2012 Document Revised: 08/15/2016 Document Reviewed: 08/15/2016 Elsevier Interactive Patient Education  2019 Else[employer] Inc.  Vitamin D Deficiency Vitamin D deficiency is when your body does not have enough vitamin D. Vitamin D is important because:  It helps your body use other minerals that your body needs.  It helps keep your bones strong and healthy.  It may help to prevent some diseases.  It helps your heart and other muscles work well. You can get vitamin D by:  Eating foods with vitamin D in them.  Drinking or eating milk or other foods that have had vitamin D added to them.  Taking a vitamin D supplement.  Being in the sun. Not getting enough vitamin D can make your bones become soft. It can also cause other health problems. Follow these instructions at  home:  Take medicines and supplements only as told by your doctor.  Eat foods that have vitamin D. These include: ? Dairy products, cereals, or juices with added vitamin D. Check the label for vitamin D. ? Fatty fish like salmon or trout. ? Eggs. ? Oysters.  Do not use tanning beds.  Stay at a healthy weight. Lose weight, if needed.  Keep all follow-up visits as told by your doctor. This is important. Contact a doctor if:  Your symptoms do not go away.  You feel sick  to your stomach (nauseous).  Youthrow up (vomit).  You poop less often than usual or you have trouble pooping (constipation). This information is not intended to replace advice given to you by your health care provider. Make sure you discuss any questions you have with your health care provider. Document Released: 04/21/2011 Document Revised: 10/08/2015 Document Reviewed: 09/17/2014 Elsevier Interactive Patient Education  2019 [employer]  Hypothyroidism  Hypothyroidism is when the thyroid gland does not make enough of certain hormones (it is underactive). The thyroid gland is a small gland located in the lower front part of the neck, just in front of the windpipe (trachea). This gland makes hormones that help control how the body uses food for energy (metabolism) as well as how the heart and brain function. These hormones also play a role in keeping your bones strong. When the thyroid is underactive, it produces too little of the hormones thyroxine (T4) and triiodothyronine (T3). What are the causes? This condition may be caused by:  Hashimoto's disease. This is a disease in which the body's disease-fighting system (immune system) attacks the thyroid gland. This is the most common cause.  Viral infections.  Pregnancy.  Certain medicines.  Birth defects.  Past radiation treatments to the head or neck for cancer.  Past treatment with radioactive iodine.  Past exposure to radiation in the environment.  Past surgical removal of part or all of the thyroid.  Problems with a gland in the center of the brain (pituitary gland).  Lack of enough iodine in the diet. What increases the risk? You are more likely to develop this condition if:  You are female.  You have a family history of thyroid conditions.  You use a medicine called lithium.  You take medicines that affect the immune system (immunosuppressants). What are the signs or symptoms? Symptoms of this condition  include:  Feeling as though you have no energy (lethargy).  Not being able to tolerate cold.  Weight gain that is not explained by a change in diet or exercise habits.  Lack of appetite.  Dry skin.  Coarse hair.  Menstrual irregularity.  Slowing of thought processes.  Constipation.  Sadness or depression. How is this diagnosed? This condition may be diagnosed based on:  Your symptoms, your medical history, and a physical exam.  Blood tests. You may also have imaging tests, such as an ultrasound or MRI. How is this treated? This condition is treated with medicine that replaces the thyroid hormones that your body does not make. After you begin treatment, it may take several weeks for symptoms to go away. Follow these instructions at home:  Take over-the-counter and prescription medicines only as told by your health care provider.  If you start taking any new medicines, tell your health care provider.  Keep all follow-up visits as told by your health care provider. This is important. ? As your condition improves, your dosage of thyroid hormone medicine may change. ? You  will need to have blood tests regularly so that your health care provider can monitor your condition. Contact a health care provider if:  Your symptoms do not get better with treatment.  You are taking thyroid replacement medicine and you: ? Sweat a lot. ? Have tremors. ? Feel anxious. ? Lose weight rapidly. ? Cannot tolerate heat. ? Have emotional swings. ? Have diarrhea. ? Feel weak. Get help right away if you have:  Chest pain.  An irregular heartbeat.  A rapid heartbeat.  Difficulty breathing. Summary  Hypothyroidism is when the thyroid gland does not make enough of certain hormones (it is underactive).  When the thyroid is underactive, it produces too little of the hormones thyroxine (T4) and triiodothyronine (T3).  The most common cause is Hashimoto's disease, a disease in which  the body's disease-fighting system (immune system) attacks the thyroid gland. The condition can also be caused by viral infections, medicine, pregnancy, or past radiation treatment to the head or neck.  Symptoms may include weight gain, dry skin, constipation, feeling as though you do not have energy, and not being able to tolerate cold.  This condition is treated with medicine to replace the thyroid hormones that your body does not make. This information is not intended to replace advice given to you by your health care provider. Make sure you discuss any questions you have with your health care provider. Document Released: 05/02/2005 Document Revised: 04/12/2017 Document Reviewed: 04/12/2017 Elsevier Interactive Patient Education  2019 Elsevier Inc.  Eustachian Tube Dysfunction  Eustachian tube dysfunction refers to a condition in which a blockage develops in the narrow passage that connects the middle ear to the back of the nose (eustachian tube). The eustachian tube regulates air pressure in the middle ear by letting air move between the ear and nose. It also helps to drain fluid from the middle ear space. Eustachian tube dysfunction can affect one or both ears. When the eustachian tube does not function properly, air pressure, fluid, or both can build up in the middle ear. What are the causes? This condition occurs when the eustachian tube becomes blocked or cannot open normally. Common causes of this condition include:  Ear infections.  Colds and other infections that affect the nose, mouth, and throat (upper respiratory tract).  Allergies.  Irritation from cigarette smoke.  Irritation from stomach acid coming up into the esophagus (gastroesophageal reflux). The esophagus is the tube that carries food from the mouth to the stomach.  Sudden changes in air pressure, such as from descending in an airplane or scuba diving.  Abnormal growths in the nose or throat, such as: ? Growths  that line the nose (nasal polyps). ? Abnormal growth of cells (tumors). ? Enlarged tissue at the back of the throat (adenoids). What increases the risk? You are more likely to develop this condition if:  You smoke.  You are overweight.  You are a child who has: ? Certain birth defects of the mouth, such as cleft palate. ? Large tonsils or adenoids. What are the signs or symptoms? Common symptoms of this condition include:  A feeling of fullness in the ear.  Ear pain.  Clicking or popping noises in the ear.  Ringing in the ear.  Hearing loss.  Loss of balance.  Dizziness. Symptoms may get worse when the air pressure around you changes, such as when you travel to an area of high elevation, fly on an airplane, or go scuba diving. How is this diagnosed? This condition may  be diagnosed based on:  Your symptoms.  A physical exam of your ears, nose, and throat.  Tests, such as those that measure: ? The movement of your eardrum (tympanogram). ? Your hearing (audiometry). How is this treated? Treatment depends on the cause and severity of your condition.  In mild cases, you may relieve your symptoms by moving air into your ears. This is called "popping the ears."  In more severe cases, or if you have symptoms of fluid in your ears, treatment may include: ? Medicines to relieve congestion (decongestants). ? Medicines that treat allergies (antihistamines). ? Nasal sprays or ear drops that contain medicines that reduce swelling (steroids). ? A procedure to drain the fluid in your eardrum (myringotomy). In this procedure, a small tube is placed in the eardrum to:  Drain the fluid.  Restore the air in the middle ear space. ? A procedure to insert a balloon device through the nose to inflate the opening of the eustachian tube (balloon dilation). Follow these instructions at home: Lifestyle  Do not do any of the following until your health care provider approves: ? Travel  to high altitudes. ? Fly in airplanes. ? Work in a Estate agent or room. ? Scuba dive.  Do not use any products that contain nicotine or tobacco, such as cigarettes and e-cigarettes. If you need help quitting, ask your health care provider.  Keep your ears dry. Wear fitted earplugs during showering and bathing. Dry your ears completely after. General instructions  Take over-the-counter and prescription medicines only as told by your health care provider.  Use techniques to help pop your ears as recommended by your health care provider. These may include: ? Chewing gum. ? Yawning. ? Frequent, forceful swallowing. ? Closing your mouth, holding your nose closed, and gently blowing as if you are trying to blow air out of your nose.  Keep all follow-up visits as told by your health care provider. This is important. Contact a health care provider if:  Your symptoms do not go away after treatment.  Your symptoms come back after treatment.  You are unable to pop your ears.  You have: ? A fever. ? Pain in your ear. ? Pain in your head or neck. ? Fluid draining from your ear.  Your hearing suddenly changes.  You become very dizzy.  You lose your balance. Summary  Eustachian tube dysfunction refers to a condition in which a blockage develops in the eustachian tube.  It can be caused by ear infections, allergies, inhaled irritants, or abnormal growths in the nose or throat.  Symptoms include ear pain, hearing loss, or ringing in the ears.  Mild cases are treated with maneuvers to unblock the ears, such as yawning or ear popping.  Severe cases are treated with medicines. Surgery may also be done (rare). This information is not intended to replace advice given to you by your health care provider. Make sure you discuss any questions you have with your health care provider. Document Released: 05/29/2015 Document Revised: 08/22/2017 Document Reviewed: 08/22/2017 Elsevier  Interactive Patient Education  2019 [employer].

## 2018-06-29 NOTE — Progress Notes (Signed)
Subjective:    Patient ID: Lauren Fox, female    DOB: 09/27/1965, 53 y.o.   MRN: [id number]  No chief complaint on file.   HPI Patient was seen today for f/u on chronic conditions and TOC, previously seen by Dr. Jonny Ruiz.  Abnormal labs: -vitamin D 7.62 and elevated TSH 6.09 from November. -Saw labs on [place], but was expecting a call about them -Rx for Synthroid 25 mcg and ergocalciferol 50,000 IU was sent to patient's pharmacy however she never picked it up. -Pt endorses fatigue   Sinus issues: -pt endorses nasal congestion, discomfort in ear/neck. -tried flonase in the past. -does not feel allegra has been helpful.  Past Medical History:  Diagnosis Date  . Acute low back pain 06/10/2013  . ALLERGIC RHINITIS 11/25/2008  . DIZZINESS 11/25/2008  . FOOT PAIN, LEFT 12/19/2009  . GLUCOSE INTOLERANCE 11/25/2008  . Gout, unspecified 11/25/2008  . Headache(784.0) 06/22/2010  . HYPERLIPIDEMIA 11/25/2008  . Impaired glucose tolerance 08/24/2010  . Lumbar disc disease 02/21/2012  . Morbid obesity (HCC) 11/25/2008  . Unspecified backache 12/17/2009    No Known Allergies  ROS General: Denies fever, chills, night sweats, changes in weight, changes in appetite  +fatigue HEENT: Denies headaches, ear pain, changes in vision, rhinorrhea, sore throat  +nasal congestion, sinus issues CV: Denies CP, palpitations, SOB, orthopnea Pulm: Denies SOB, cough, wheezing GI: Denies abdominal pain, nausea, vomiting, diarrhea, constipation GU: Denies dysuria, hematuria, frequency, vaginal discharge Msk: Denies muscle cramps, joint pains Neuro: Denies weakness, numbness, tingling Skin: Denies rashes, bruising Psych: Denies depression, anxiety, hallucinations    Objective:    Blood pressure 110/68, pulse 90, temperature 98.3 F (36.8 C), temperature source Oral, height 5\' 4"  (1.626 m), weight 239 lb (108.4 kg), SpO2 98 %.  Gen. Pleasant, well-nourished, in no distress, normal affect   HEENT: [place]/AT, face  symmetric, no scleral icterus, PERRLA,  nares patent without drainage, pharynx without erythema or exudate.  TMs full b/l. Lungs: no accessory muscle use, CTAB, no wheezes or rales Cardiovascular: RRR, no m/r/g, no peripheral edema Neuro:  A&Ox3, CN II-XII intact, normal gait Skin:  Warm, no lesions/ rash  Wt Readings from Last 3 Encounters:  06/29/18 239 lb (108.4 kg)  04/11/18 242 lb (109.8 kg)  03/14/18 239 lb 1.3 oz (108.4 kg)    Lab Results  Component Value Date   WBC 9.3 04/11/2018   HGB 14.4 04/11/2018   HCT 42.3 04/11/2018   PLT 230.0 04/11/2018   GLUCOSE 78 04/11/2018   CHOL 202 (H) 04/11/2018   TRIG 118.0 04/11/2018   HDL 52.00 04/11/2018   LDLCALC 126 (H) 04/11/2018   ALT 15 04/11/2018   AST 16 04/11/2018   NA 137 04/11/2018   K 4.1 04/11/2018   CL 102 04/11/2018   CREATININE 0.84 04/11/2018   BUN 10 04/11/2018   CO2 24 04/11/2018   TSH 6.09 (H) 04/11/2018   HGBA1C 5.5 04/11/2018   MICROALBUR 0.4 06/19/2009    Assessment/Plan:  Seasonal allergies -discussed restarting flonase -will switch from Allegra to a different allergy medicine such as Zyrtec or Claritin -consider local honey and saline nasal rinse.  Fatigue, unspecified type  - Plan: TSH, T4, Free, Vitamin D, 25-hydroxy  Vitamin D deficiency  - Plan: Vitamin D, 25-hydroxy, Vitamin D, 25-hydroxy  Abnormal laboratory test result  - Plan: TSH, T4, Free, Vitamin D, 25-hydroxy  Screen for colon cancer  - Plan: Ambulatory referral to Gastroenterology  Dysfunction of left eustachian tube  -restart flonase.   -  will try Zyrtec or claritin -given handout  F/u prn  Abbe Amsterdam, MD

## 2018-07-03 ENCOUNTER — Other Ambulatory Visit: Payer: Self-pay | Admitting: Family Medicine

## 2018-07-03 ENCOUNTER — Encounter: Payer: Self-pay | Admitting: Family Medicine

## 2018-07-03 MED ORDER — VITAMIN D (ERGOCALCIFEROL) 1.25 MG (50000 UNIT) PO CAPS: 50000.0000 [IU] | ORAL_CAPSULE | ORAL | 0 refills | Status: DC

## 2018-07-04 ENCOUNTER — Encounter: Payer: Self-pay | Admitting: Gastroenterology

## 2018-07-05 ENCOUNTER — Encounter: Payer: Self-pay | Admitting: Family Medicine

## 2018-07-24 ENCOUNTER — Encounter: Payer: Self-pay | Admitting: Gastroenterology

## 2018-07-24 ENCOUNTER — Ambulatory Visit (AMBULATORY_SURGERY_CENTER): Payer: Self-pay

## 2018-07-24 ENCOUNTER — Other Ambulatory Visit: Payer: Self-pay

## 2018-07-24 VITALS — Ht 65.0 in | Wt 246.2 lb

## 2018-07-24 DIAGNOSIS — Z1211 Encounter for screening for malignant neoplasm of colon: Secondary | ICD-10-CM

## 2018-07-24 MED ORDER — PEG-KCL-NACL-NASULF-NA ASC-C 140 G PO SOLR: 1.0000 | Freq: Once | ORAL | 0 refills | Status: AC

## 2018-07-24 NOTE — Progress Notes (Signed)
No egg or soy allergy known to patient  No issues with past sedation with any surgeries  or procedures, no intubation problems  No diet pills per patient No home 02 use per patient  No blood thinners per patient  Pt denies issues with constipation  No A fib or A flutter  EMMI video sent to pt's e mail  

## 2018-07-27 ENCOUNTER — Telehealth: Payer: Self-pay | Admitting: Gastroenterology

## 2018-07-27 NOTE — Telephone Encounter (Signed)
Pt called back said that the ID# was incorrect. Would like to speak to you.

## 2018-07-27 NOTE — Telephone Encounter (Signed)
Called pt back to answer her questions about the coupon for Plenvu. Due to the pt having problems with getting the Plenvu and problems at the pharmacy, I offered a sample of the Plenvu, Lot # [id number],Exp 02/2019 for the pt to come pick up on the 3rd floor.The pt states she will come later today. She will call back if she has further questions. Cherylann Ratel in [facility]

## 2018-07-27 NOTE — Telephone Encounter (Signed)
Spoke with pt and gave her the number [phone]) to call to activate the coupon. Informed the pt to have the ID# and group number available, which she did have. She will call back if she has further questions. Cherylann Ratel

## 2018-07-27 NOTE — Telephone Encounter (Signed)
Pt called to inform that she was given a piece of paper with code to get discount for plenvu, her pharmacy told her that it needed to be activated. Pt is not sure of how to do that. Pls call pt.

## 2018-08-03 ENCOUNTER — Encounter: Payer: Self-pay | Admitting: Family Medicine

## 2018-08-07 ENCOUNTER — Encounter: Payer: 59 | Admitting: Gastroenterology

## 2018-09-18 ENCOUNTER — Telehealth: Payer: Self-pay | Admitting: *Deleted

## 2018-09-18 NOTE — Telephone Encounter (Signed)
Called pt to RS her colon that was cancelled back in March - pt states she has a Plenvu kit at home from that PV- We did RS to 5-26 at 11 am with Dr Loletha Carrow - Verified address to mail new instructions to pt- Instructed her to call with Questions once she receives these new instructions- she verbalized understanding - sent instructions via My Chart and Mail   Lelan Pons [facility]

## 2018-10-03 ENCOUNTER — Encounter: Payer: Self-pay | Admitting: Family Medicine

## 2018-10-09 ENCOUNTER — Encounter: Payer: 59 | Admitting: Gastroenterology

## 2018-10-12 ENCOUNTER — Telehealth: Payer: Self-pay | Admitting: *Deleted

## 2018-10-12 NOTE — Telephone Encounter (Signed)
Called to complete pre procedure screening. No answer. Left message.   Covid-19 screening questions  Have you traveled in the last 14 days? If yes where?  Do you now or have you had a fever in the last 14 days?  Do you have any respiratory symptoms of shortness of breath or cough now or in the last 14 days?  Do you have any family members or close contacts with diagnosed or suspected Covid-19 in the past 14 days?  Have you been tested for Covid-19 and found to be positive?

## 2018-10-12 NOTE — Telephone Encounter (Signed)
Covid-19 screening questions  Have you traveled in the last 14 days? No If yes where?  Do you now or have you had a fever in the last 14 days? No  Do you have any respiratory symptoms of shortness of breath or cough now or in the last 14 days? No  Do you have any family members or close contacts with diagnosed or suspected Covid-19 in the past 14 days? No  Have you been tested for Covid-19 and found to be positive? No  Pt aware care partner to wait in the car in the parking lot and to wear a mask into the building.

## 2018-10-12 NOTE — Telephone Encounter (Signed)
Pt returned your call, pls call her again.  °

## 2018-10-12 NOTE — Telephone Encounter (Signed)
Attempted to call patient again to complete pre procedure screening. No answer. Left message for her to call me, you may reach me at [phone].

## 2018-10-15 ENCOUNTER — Telehealth: Payer: Self-pay | Admitting: *Deleted

## 2018-10-15 ENCOUNTER — Encounter: Payer: Self-pay | Admitting: Gastroenterology

## 2018-10-15 ENCOUNTER — Ambulatory Visit (AMBULATORY_SURGERY_CENTER): Payer: 59 | Admitting: Gastroenterology

## 2018-10-15 ENCOUNTER — Other Ambulatory Visit: Payer: Self-pay

## 2018-10-15 VITALS — BP 129/69 | HR 58 | Temp 98.9°F | Resp 17 | Ht 64.0 in | Wt 239.0 lb

## 2018-10-15 DIAGNOSIS — Z1211 Encounter for screening for malignant neoplasm of colon: Secondary | ICD-10-CM

## 2018-10-15 MED ORDER — SODIUM CHLORIDE 0.9 % IV SOLN: 500.0000 mL | Freq: Once | INTRAVENOUS | Status: DC

## 2018-10-15 NOTE — Op Note (Signed)
[facility] Patient Name: Lauren MortimerLeslie Fox Procedure Date: 10/15/2018 10:54 AM MRN: [id number] Endoscopist: Sherilyn CooterHenry L. Myrtie Neitheranis , MD Age: [age] Referring MD:  Date of Birth: 12/15/1965 Gender: Female Account #: [id number] Procedure:                Colonoscopy Indications:              Screening for colorectal malignant neoplasm, This                            is the patient's first colonoscopy Medicines:                Monitored Anesthesia Care Procedure:                Pre-Anesthesia Assessment:                           - Prior to the procedure, a History and Physical                            was performed, and patient medications and                            allergies were reviewed. The patient's tolerance of                            previous anesthesia was also reviewed. The risks                            and benefits of the procedure and the sedation                            options and risks were discussed with the patient.                            All questions were answered, and informed consent                            was obtained. Prior Anticoagulants: The patient has                            taken no previous anticoagulant or antiplatelet                            agents. ASA Grade Assessment: II - A patient with                            mild systemic disease. After reviewing the risks                            and benefits, the patient was deemed in                            satisfactory condition to undergo the procedure.  After obtaining informed consent, the colonoscope                            was passed under direct vision. Throughout the                            procedure, the patient's blood pressure, pulse, and                            oxygen saturations were monitored continuously. The                            Colonoscope was introduced through the anus and                            advanced to the the  cecum, identified by                            appendiceal orifice and ileocecal valve. The                            colonoscopy was performed without difficulty. The                            patient tolerated the procedure well. The quality                            of the bowel preparation was good. The ileocecal                            valve, appendiceal orifice, and rectum were                            photographed. Scope In: 10:56:51 AM Scope Out: 11:08:22 AM Scope Withdrawal Time: 0 hours 9 minutes 0 seconds  Total Procedure Duration: 0 hours 11 minutes 31 seconds  Findings:                 The perianal and digital rectal examinations were                            normal.                           A few small-mouthed diverticula were found in the                            sigmoid colon.                           The exam was otherwise without abnormality on                            direct and retroflexion views. Complications:            No immediate complications. Estimated Blood Loss:  Estimated blood loss: none. Impression:               - Diverticulosis in the sigmoid colon.                           - The examination was otherwise normal on direct                            and retroflexion views.                           - No specimens collected. Recommendation:           - Patient has a contact number available for                            emergencies. The signs and symptoms of potential                            delayed complications were discussed with the                            patient. Return to normal activities tomorrow.                            Written discharge instructions were provided to the                            patient.                           - Resume previous diet.                           - Continue present medications.                           - Repeat colonoscopy in 10 years for screening                             purposes. Henry L. Myrtie Neither, MD 10/15/2018 11:17:45 AM This report has been signed electronically.

## 2018-10-15 NOTE — Progress Notes (Signed)
Lauren Fox took temp and Sarah Monday took vitals. 

## 2018-10-15 NOTE — Progress Notes (Signed)
A and O x3. Report to RN. Tolerated MAC anesthesia well.

## 2018-10-15 NOTE — Progress Notes (Signed)
Pt's states no medical or surgical changes since previsit or office visit. 

## 2018-10-15 NOTE — Telephone Encounter (Signed)
Called and left message to change lab appointment time office is currently open 8am to 4pm last lab appointment is 3:30

## 2018-10-15 NOTE — Patient Instructions (Signed)
YOU HAD AN ENDOSCOPIC PROCEDURE TODAY AT THE [facility]:   Refer to the procedure report that was given to you for any specific questions about what was found during the examination.  If the procedure report does not answer your questions, please call your gastroenterologist to clarify.  If you requested that your care partner not be given the details of your procedure findings, then the procedure report has been included in a sealed envelope for you to review at your convenience later.  YOU SHOULD EXPECT: Some feelings of bloating in the abdomen. Passage of more gas than usual.  Walking can help get rid of the air that was put into your GI tract during the procedure and reduce the bloating. If you had a lower endoscopy (such as a colonoscopy or flexible sigmoidoscopy) you may notice spotting of blood in your stool or on the toilet paper. If you underwent a bowel prep for your procedure, you may not have a normal bowel movement for a few days.  Please Note:  You might notice some irritation and congestion in your nose or some drainage.  This is from the oxygen used during your procedure.  There is no need for concern and it should clear up in a day or so.  SYMPTOMS TO REPORT IMMEDIATELY:   Following lower endoscopy (colonoscopy or flexible sigmoidoscopy):  Excessive amounts of blood in the stool  Significant tenderness or worsening of abdominal pains  Swelling of the abdomen that is new, acute  Fever of 100F or higher  For urgent or emergent issues, a gastroenterologist can be reached at any hour by calling [phone].   DIET:  We do recommend a small meal at first, but then you may proceed to your regular diet.  Drink plenty of fluids but you should avoid alcoholic beverages for 24 hours.  ACTIVITY:  You should plan to take it easy for the rest of today and you should NOT DRIVE or use heavy machinery until tomorrow (because of the sedation medicines used during the test).     FOLLOW UP: Our staff will call the number listed on your records 48-72 hours following your procedure to check on you and address any questions or concerns that you may have regarding the information given to you following your procedure. If we do not reach you, we will leave a message.  We will attempt to reach you two times.  During this call, we will ask if you have developed any symptoms of COVID 19. If you develop any symptoms (ie: fever, flu-like symptoms, shortness of breath, cough etc.) before then, please call [phone].  If you test positive for Covid 19 in the 2 weeks post procedure, please call and report this information to us.    If any biopsies were taken you will be contacted by phone or by letter within the next 1-3 weeks.  Please call us at [phone] if you have not heard about the biopsies in 3 weeks.    SIGNATURES/CONFIDENTIALITY: You and/or your care partner have signed paperwork which will be entered into your electronic medical record.  These signatures attest to the fact that that the information above on your After Visit Summary has been reviewed and is understood.  Full responsibility of the confidentiality of this discharge information lies with you and/or your care-partner. 

## 2018-10-17 ENCOUNTER — Other Ambulatory Visit: Payer: 59

## 2018-10-17 ENCOUNTER — Telehealth: Payer: Self-pay

## 2018-10-17 NOTE — Telephone Encounter (Signed)
1. Have you developed a fever since your procedure? no  2.   Have you had an respiratory symptoms (SOB or cough) since your procedure? no  3.   Have you tested positive for COVID 19 since your procedure no  4.   Have you had any family members/close contacts diagnosed with the COVID 19 since your procedure?  no   If yes to any of these questions please route to Tracy Walton, RN and Natalie Sechrist, RN.  

## 2018-10-17 NOTE — Telephone Encounter (Signed)
  Follow up Call-  Call back number 10/15/2018  Post procedure Call Back phone  # [phone]  Permission to leave phone message Yes  Some recent data might be hidden     Patient questions:  Do you have a fever, pain , or abdominal swelling? No. Pain Score  0 *  Have you tolerated food without any problems? Yes.    Have you been able to return to your normal activities? Yes.    Do you have any questions about your discharge instructions: Diet   No. Medications  No. Follow up visit  No.  Do you have questions or concerns about your Care? No.  Actions: * If pain score is 4 or above: No action needed, pain <4.

## 2018-10-25 ENCOUNTER — Other Ambulatory Visit: Payer: 59

## 2018-10-29 ENCOUNTER — Other Ambulatory Visit (INDEPENDENT_AMBULATORY_CARE_PROVIDER_SITE_OTHER): Payer: 59

## 2018-10-29 ENCOUNTER — Other Ambulatory Visit: Payer: Self-pay

## 2018-10-29 DIAGNOSIS — E559 Vitamin D deficiency, unspecified: Secondary | ICD-10-CM | POA: Diagnosis not present

## 2018-10-30 ENCOUNTER — Other Ambulatory Visit: Payer: Self-pay | Admitting: Family Medicine

## 2018-10-30 LAB — VITAMIN D 25 HYDROXY (VIT D DEFICIENCY, FRACTURES): VITD: 27.78 ng/mL — ABNORMAL LOW (ref 30.00–100.00)

## 2018-10-30 MED ORDER — VITAMIN D (ERGOCALCIFEROL) 1.25 MG (50000 UNIT) PO CAPS: 50000.0000 [IU] | ORAL_CAPSULE | ORAL | 0 refills | Status: DC

## 2018-12-18 ENCOUNTER — Encounter: Payer: Self-pay | Admitting: Family Medicine

## 2019-04-29 ENCOUNTER — Encounter: Payer: Self-pay | Admitting: Family Medicine

## 2019-05-15 ENCOUNTER — Ambulatory Visit (INDEPENDENT_AMBULATORY_CARE_PROVIDER_SITE_OTHER): Payer: 59

## 2019-05-15 ENCOUNTER — Other Ambulatory Visit: Payer: Self-pay

## 2019-05-15 ENCOUNTER — Ambulatory Visit: Payer: 59 | Admitting: Family Medicine

## 2019-05-15 DIAGNOSIS — Z23 Encounter for immunization: Secondary | ICD-10-CM | POA: Diagnosis not present

## 2019-05-15 NOTE — Progress Notes (Signed)
Per orders of Dr. Banks, injection of Shingrix given by Coral Timme N Makaylin Carlo. Patient tolerated injection well.  

## 2019-06-07 ENCOUNTER — Other Ambulatory Visit: Payer: Self-pay

## 2019-06-07 ENCOUNTER — Encounter (HOSPITAL_COMMUNITY): Payer: Self-pay | Admitting: Emergency Medicine

## 2019-06-07 ENCOUNTER — Emergency Department (HOSPITAL_COMMUNITY): Payer: 59

## 2019-06-07 ENCOUNTER — Emergency Department (HOSPITAL_COMMUNITY)
Admission: EM | Admit: 2019-06-07 | Discharge: 2019-06-07 | Disposition: A | Discharge status: Home / Self Care | Payer: 59 | Attending: Emergency Medicine | Admitting: Emergency Medicine

## 2019-06-07 DIAGNOSIS — Z87891 Personal history of nicotine dependence: Secondary | ICD-10-CM | POA: Diagnosis not present

## 2019-06-07 DIAGNOSIS — M79672 Pain in left foot: Secondary | ICD-10-CM | POA: Insufficient documentation

## 2019-06-07 DIAGNOSIS — Z79899 Other long term (current) drug therapy: Secondary | ICD-10-CM | POA: Insufficient documentation

## 2019-06-07 MED ORDER — KETOROLAC TROMETHAMINE 15 MG/ML IJ SOLN
30.0000 mg | Freq: Once | INTRAMUSCULAR | Status: AC
  Administered 2019-06-07: 30 mg via INTRAMUSCULAR
  Filled 2019-06-07: qty 2

## 2019-06-07 MED ORDER — NAPROXEN 500 MG PO TABS: 500.0000 mg | ORAL_TABLET | Freq: Two times a day (BID) | ORAL | 0 refills | Status: DC

## 2019-06-07 NOTE — ED Notes (Signed)
Ice pack applied to affected region.

## 2019-06-07 NOTE — ED Triage Notes (Signed)
Patient complaining of left foot pain. Patient states it started hurting on 06/04/2019. Patient does not know when she hurt it. She states it started out of the blue.

## 2019-06-07 NOTE — ED Notes (Signed)
Pt has limited range of motion of the left foot and ankle secondary to pain. There is notable swelling. Pedal pulses palpated 2+ bilaterally. Pt denies injury or trauma to left foot. Pt states she is unable to bare weight on lower extremity due to pain.

## 2019-06-07 NOTE — ED Provider Notes (Signed)
[place] DEPT Provider Note   CSN: [id number] Arrival date & time: 06/07/19  0032     History Chief Complaint  Patient presents with  . Foot Pain    Lauren Fox is a 54 y.o. female with a hx of hyperlipidemia & gout who presents to the ED with complaints of left foot pain for the past few days.  Patient states the pain is located to the forefoot into the toes, it is constant, worse with movement and attempts to weight-bear, no alleviating factors.  She has tried ibuprofen without relief.  She states she will get occasional intermittent paresthesias to the digits but this is not constant.  She does not recall any specific injury, change in activity, or new footwear.  She had a similar pain several years ago that seemed to resolve on its own.  Denies fever, chills, redness, complete numbness, or weakness.  HPI     Past Medical History:  Diagnosis Date  . Acute low back pain 06/10/2013  . ALLERGIC RHINITIS 11/25/2008  . Arthritis   . DIZZINESS 11/25/2008  . FOOT PAIN, LEFT 12/19/2009  . GLUCOSE INTOLERANCE 11/25/2008  . Gout, unspecified 11/25/2008  . Headache(784.0) 06/22/2010  . HYPERLIPIDEMIA 11/25/2008  . Impaired glucose tolerance 08/24/2010  . Lumbar disc disease 02/21/2012  . Morbid obesity ([place]) 11/25/2008  . Unspecified backache 12/17/2009    Patient Active Problem List   Diagnosis Date Noted  . Menopausal flushing 01/18/2018  . Night sweats 01/18/2018  . De Quervain's syndrome (tenosynovitis) 09/04/2017  . Left otitis media 04/12/2017  . Vertigo 10/25/2014  . Pain and swelling of ankle 02/23/2014  . Hyperprolactinemia ([place]) 02/21/2012  . Lumbar disc disease 02/21/2012  . Heart palpitations 02/21/2012  . Vitamin D deficiency 02/23/2011  . Impaired glucose tolerance 08/24/2010  . Preventative health care 08/24/2010  . HYPERLIPIDEMIA 11/25/2008  . Gout, unspecified 11/25/2008  . Morbid obesity ([place]) 11/25/2008  . Allergic rhinitis  11/25/2008    Past Surgical History:  Procedure Laterality Date  . ABDOMINAL HYSTERECTOMY  20063   non malignant  . s/p pilonidal cyst    . TUBAL LIGATION       OB History   No obstetric history on file.     Family History  Problem Relation Age of Onset  . Hyperlipidemia Mother   . Hypertension Mother   . Glaucoma Father   . Hypertension Father   . Diabetes Father   . Diabetes Other   . Heart attack Other   . Coronary artery disease Other   . Diabetes Other   . Colon cancer Neg Hx   . Esophageal cancer Neg Hx   . Rectal cancer Neg Hx   . Stomach cancer Neg Hx     Social History   Tobacco Use  . Smoking status: Former Smoker    Types: Cigarettes    Quit date: 05/16/2012    Years since quitting: 7.0  . Smokeless tobacco: Never Used  . Tobacco comment: pt verbalized never smoking  Substance Use Topics  . Alcohol use: Yes    Alcohol/week: 0.0 standard drinks    Comment: social  . Drug use: No    Types: Benzodiazepines    Home Medications Prior to Admission medications   Medication Sig Start Date End Date Taking? Authorizing Provider  fluticasone (FLONASE) 50 MCG/ACT nasal spray Place 2 sprays into both nostrils daily. 03/14/18   Marrian Salvage, FNP  meclizine (ANTIVERT) 25 MG tablet Take 1 tablet (25  mg total) by mouth 3 (three) times daily as needed for dizziness. Patient not taking: Reported on 07/24/2018 09/25/17   Corwin Levins, MD  meloxicam (MOBIC) 15 MG tablet Take 1 tablet (15 mg total) by mouth daily. Patient not taking: Reported on 07/24/2018 01/18/18   Hyatt, Max T, DPM  naproxen (NAPROSYN) 500 MG tablet TAKE ONE TABLET BY MOUTH TWICE DAILY WITH MEALS AS NEEDED Patient not taking: Reported on 07/24/2018 04/12/17   Corwin Levins, MD  Vitamin D, Ergocalciferol, (DRISDOL) 1.25 MG (50000 UT) CAPS capsule Take 1 capsule (50,000 Units total) by mouth every 7 (seven) days. 10/30/18   Deeann Saint, MD    Allergies    Patient has no known  allergies.  Review of Systems   Review of Systems  Constitutional: Negative for chills and fever.  Respiratory: Negative for shortness of breath.   Cardiovascular: Negative for chest pain and leg swelling.  Musculoskeletal: Positive for arthralgias.  Skin: Negative for color change, pallor, rash and wound.  Neurological: Negative for weakness and numbness.       Positive for intermittent paresthesias to the L foot digits.     Physical Exam Updated Vital Signs BP (!) 146/76 (BP Location: Right Arm)   Pulse 72   Temp 98.1 F (36.7 C) (Oral)   Resp 14   Ht 5' 4.5" (1.638 m)   Wt 106.6 kg   SpO2 100%   BMI 39.71 kg/m   Physical Exam Vitals and nursing note reviewed.  Constitutional:      General: She is not in acute distress.    Appearance: She is not ill-appearing or toxic-appearing.  HENT:     Head: Normocephalic and atraumatic.  Cardiovascular:     Pulses:          Dorsalis pedis pulses are 2+ on the right side and 2+ on the left side.       Posterior tibial pulses are 2+ on the right side and 2+ on the left side.  Pulmonary:     Effort: Pulmonary effort is normal.  Musculoskeletal:     Comments: Lower extremities: No obvious deformity, appreciable swelling, edema, erythema, ecchymosis, warmth, or open wounds. Patient has intact AROM to bilateral hips, knees, ankles, and all digits. Tender to palpation to the forefoot in the metatarsal region of the left foot. Otherwise nontender. NVI distally. Reproducible with metatarsal squeeze test.   Skin:    General: Skin is warm and dry.     Capillary Refill: Capillary refill takes less than 2 seconds.  Neurological:     Mental Status: She is alert.     Comments: Alert. Clear speech. Sensation grossly intact to bilateral lower extremities. 5/5 strength with plantar/dorsiflexion bilaterally.   Psychiatric:        Mood and Affect: Mood normal.        Behavior: Behavior normal.     ED Results / Procedures / Treatments    Labs (all labs ordered are listed, but only abnormal results are displayed) Labs Reviewed - No data to display  EKG None  Radiology DG Foot Complete Left  Result Date: 06/07/2019 CLINICAL DATA:  Pain EXAM: LEFT FOOT - COMPLETE 3+ VIEW COMPARISON:  None. FINDINGS: There is no evidence of fracture or dislocation. There is no evidence of arthropathy or other focal bone abnormality. Soft tissues are unremarkable. IMPRESSION: Negative. Electronically Signed   By: Katherine Mantle M.D.   On: 06/07/2019 01:09    Procedures Procedures (including critical care time)  Medications Ordered in ED Medications  ketorolac (TORADOL) 15 MG/ML injection 30 mg (30 mg Intramuscular Given 06/07/19 0152)    ED Course  I have reviewed the triage vital signs and the nursing notes.  Pertinent labs & imaging results that were available during my care of the patient were reviewed by me and considered in my medical decision making (see chart for details).    MDM Rules/Calculators/A&P                     Patient presents to the ED with complaints of L foot pain for past few days.  Nontoxic, vitals WNL with the exception of elevated BP- doubt HTN emergency.  Afebrile, no erythema/warmth, no wounds or prior surgeries- not consistent w/ infectious process such as cellulitis, osteomyelitis, or septic joint. Atraumatic, xray w/o fx/dislocation. No edema or calf tenderness to suggest DVT. NVI distally. No specific to a joint such as the MTP therefore feel gout is less likely- she tells me she was told her pain that was similar last time was not gout. Question morton's neuroma with distribution. Will treat with Naproxen. Orthopedics follow up. I discussed results, treatment plan, need for follow-up, and return precautions with the patient. Provided opportunity for questions, patient confirmed understanding and is in agreement with plan.    Final Clinical Impression(s) / ED Diagnoses Final diagnoses:  Left foot  pain    Rx / DC Orders ED Discharge Orders         Ordered    naproxen (NAPROSYN) 500 MG tablet  2 times daily     06/07/19 0138           Cherly Anderson, PA-C 06/07/19 0157    Molpus, Jonny Ruiz, MD 06/07/19 [phone]

## 2019-06-07 NOTE — Discharge Instructions (Addendum)
Please read and follow all provided instructions.  You have been seen today for left foot pain.   Tests performed today include: An x-ray of the affected area - does NOT show any broken bones or dislocations.  Vital signs. See below for your results today.   Home care instructions: -- *PRICE in the first 24-48 hours after injury: Protect  Rest Ice- Do not apply ice pack directly to your skin, place towel or similar between your skin and ice/ice pack. Apply ice for 20 min, then remove for 40 min while awake Compression- Can try ace wrap.  Elevate affected extremity above the level of your heart when not walking around for the first 24-48 hours   Medications:  - Naproxen is a nonsteroidal anti-inflammatory medication that will help with pain and swelling. Be sure to take this medication as prescribed with food, 1 pill every 12 hours,  It should be taken with food, as it can cause stomach upset, and more seriously, stomach bleeding. Do not take other nonsteroidal anti-inflammatory medications with this such as Advil, Motrin, Aleve, Mobic, Goodie Powder, or Motrin.  --> please start this medicine tomorrow as we gave you a similar medicine tonight.   You make take Tylenol per over the counter dosing with these medications.   We have prescribed you new medication(s) today. Discuss the medications prescribed today with your pharmacist as they can have adverse effects and interactions with your other medicines including over the counter and prescribed medications. Seek medical evaluation if you start to experience new or abnormal symptoms after taking one of these medicines, seek care immediately if you start to experience difficulty breathing, feeling of your throat closing, facial swelling, or rash as these could be indications of a more serious allergic reaction   Follow-up instructions: Please follow-up with your primary care provider or the provided orthopedic physician (bone specialist) if you  continue to have significant pain over the next 3 days.  Return instructions:  Please return if your digits or extremity are numb or tingling, appear gray or blue, or you have severe pain (also elevate the extremity and loosen splint or wrap if you were given one) Please return if you have redness or fevers.  Please return to the Emergency Department if you experience worsening symptoms.  Please return if you have any other emergent concerns. Additional Information:  Your vital signs today were: BP (!) 146/76 (BP Location: Right Arm)   Pulse 72   Temp 98.1 F (36.7 C) (Oral)   Resp 14   Ht 5' 4.5" (1.638 m)   Wt 106.6 kg   SpO2 100%   BMI 39.71 kg/m  If your blood pressure (BP) was elevated above 135/85 this visit, please have this repeated by your doctor within one month. ---------------

## 2019-06-11 ENCOUNTER — Other Ambulatory Visit: Payer: Self-pay

## 2019-06-11 ENCOUNTER — Ambulatory Visit (INDEPENDENT_AMBULATORY_CARE_PROVIDER_SITE_OTHER): Payer: 59 | Admitting: Podiatry

## 2019-06-11 ENCOUNTER — Encounter: Payer: Self-pay | Admitting: Podiatry

## 2019-06-11 DIAGNOSIS — M722 Plantar fascial fibromatosis: Secondary | ICD-10-CM

## 2019-06-11 MED ORDER — MELOXICAM 15 MG PO TABS: 15.0000 mg | ORAL_TABLET | Freq: Every day | ORAL | 3 refills | Status: DC

## 2019-06-11 MED ORDER — METHYLPREDNISOLONE 4 MG PO TBPK: ORAL_TABLET | ORAL | 0 refills | Status: DC

## 2019-06-12 NOTE — Progress Notes (Signed)
Subjective:  Patient ID: Lauren Fox, female    DOB: 30-Dec-1965,  MRN: [id number] HPI Chief Complaint  Patient presents with  . Foot Pain    Medial heel and some dorsal left - aching x few months, no injury, swelling, ER xrayed, "thinking still the fasciitis"    54 y.o. female presents with the above complaint.   ROS: Denies fever chills nausea vomiting muscle aches pains calf pain back pain chest pain shortness of breath.  Past Medical History:  Diagnosis Date  . Acute low back pain 06/10/2013  . ALLERGIC RHINITIS 11/25/2008  . Arthritis   . DIZZINESS 11/25/2008  . FOOT PAIN, LEFT 12/19/2009  . GLUCOSE INTOLERANCE 11/25/2008  . Gout, unspecified 11/25/2008  . Headache(784.0) 06/22/2010  . HYPERLIPIDEMIA 11/25/2008  . Impaired glucose tolerance 08/24/2010  . Lumbar disc disease 02/21/2012  . Morbid obesity ([facility]) 11/25/2008  . Unspecified backache 12/17/2009   Past Surgical History:  Procedure Laterality Date  . ABDOMINAL HYSTERECTOMY  20063   non malignant  . s/p pilonidal cyst    . TUBAL LIGATION      Current Outpatient Medications:  .  fluticasone (FLONASE) 50 MCG/ACT nasal spray, Place 2 sprays into both nostrils daily., Disp: 16 g, Rfl: 6 .  meloxicam (MOBIC) 15 MG tablet, Take 1 tablet (15 mg total) by mouth daily., Disp: 30 tablet, Rfl: 3 .  methylPREDNISolone (MEDROL DOSEPAK) 4 MG TBPK tablet, 6 day dose pack - take as directed, Disp: 21 tablet, Rfl: 0 .  naproxen (NAPROSYN) 500 MG tablet, Take 1 tablet (500 mg total) by mouth 2 (two) times daily., Disp: 10 tablet, Rfl: 0 .  Vitamin D, Ergocalciferol, (DRISDOL) 1.25 MG (50000 UT) CAPS capsule, Take 1 capsule (50,000 Units total) by mouth every 7 (seven) days., Disp: 12 capsule, Rfl: 0  No Known Allergies Review of Systems Objective:  There were no vitals filed for this visit.  General: Well developed, nourished, in no acute distress, alert and oriented x3   Dermatological: Skin is warm, dry and supple bilateral.  Nails x 10 are well maintained; remaining integument appears unremarkable at this time. There are no open sores, no preulcerative lesions, no rash or signs of infection present.  Vascular: Dorsalis Pedis artery and Posterior Tibial artery pedal pulses are 2/4 bilateral with immedate capillary fill time. Pedal hair growth present. No varicosities and no lower extremity edema present bilateral.   Neruologic: Grossly intact via light touch bilateral. Vibratory intact via tuning fork bilateral. Protective threshold with Semmes Wienstein monofilament intact to all pedal sites bilateral. Patellar and Achilles deep tendon reflexes 2+ bilateral. No Babinski or clonus noted bilateral.   Musculoskeletal: No gross boney pedal deformities bilateral. No pain, crepitus, or limitation noted with foot and ankle range of motion bilateral. Muscular strength 5/5 in all groups tested bilateral.  She has pain on palpation medial calcaneal tubercle of her left heel no pain on medial and lateral compression of the calcaneus.  Mild pes planus noted.  Gait: Unassisted, Nonantalgic.    Radiographs:  Radiographs reviewed today demonstrate soft tissue increase in density plantar fashion calcaneal insertion site.  Otherwise no acute findings. Assessment & Plan:   Assessment: Plantar fasciitis left  Plan: Discussed etiology pathology conservative surgical therapies.  I injected the left heel today after sterile Betadine skin prep with 20 mg Kenalog 5 mg Marcaine point of maximal tenderness left.  Tolerated procedure well without complications placed her in a plantar fascial brace discussed appropriate shoe gear stretching exercises ice  therapy.  Started her on methylprednisolone to be followed by meloxicam.  Follow-up with her in 1 month.     Saryna Kneeland T. [place], [place]

## 2019-07-02 ENCOUNTER — Telehealth: Payer: Self-pay | Admitting: Family Medicine

## 2019-07-02 ENCOUNTER — Encounter: Payer: Self-pay | Admitting: Family Medicine

## 2019-07-02 NOTE — Telephone Encounter (Signed)
Spoke with pt advised that the infusion appointment was not scheduled by our office, advised pt to call Infusion  clinic and have them cancel the appointment, pt verbalized understanding

## 2019-07-02 NOTE — Telephone Encounter (Signed)
Pt received a appt reminder on mychart for a INFUSION 240 tomorrow 07/03/19 @ 12:30. She has no idea what appt is for and has not discussed with Dr. Salomon Fick about having any infusions. Patient would like a call back about this visit.

## 2019-07-03 ENCOUNTER — Ambulatory Visit (HOSPITAL_COMMUNITY): Payer: 59

## 2019-07-12 ENCOUNTER — Telehealth (INDEPENDENT_AMBULATORY_CARE_PROVIDER_SITE_OTHER): Payer: 59 | Admitting: Family Medicine

## 2019-07-12 DIAGNOSIS — N951 Menopausal and female climacteric states: Secondary | ICD-10-CM

## 2019-07-12 DIAGNOSIS — K580 Irritable bowel syndrome with diarrhea: Secondary | ICD-10-CM | POA: Diagnosis not present

## 2019-07-12 NOTE — Progress Notes (Signed)
Virtual Visit via Telephone Note  I connected with Lauren Fox on 07/12/19 at  9:30 AM EST by telephone and verified that I am speaking with the correct person using two identifiers.   I discussed the limitations, risks, security and privacy concerns of performing an evaluation and management service by telephone and the availability of in person appointments. I also discussed with the patient that there may be a patient responsible charge related to this service. The patient expressed understanding and agreed to proceed.  Location patient: home Location provider: work or home office Participants present for the call: patient, provider Patient did not have a visit in the prior 7 days to address this/these issue(s).   History of Present Illness: Pt states she may be having menopause issues.  S/p Hysterectomy in 2008.Seen by Luisa Dago who suggested Estrogen replacement.  Pt does not want to take HRT due to possible s/e.  Was taking Estroven, but stopped as she felt better.  Endorses hot flashes and night sweats.  Restarted Estroven.  Pt denies feeling depressed, but has noticed some irritability.    Pt notes having diarrhea, shakiness after having a disagreement with her husband.  Has some bloating.  Notes symptoms have since resolved.  Denies n/v.  Drinking 2-3 pepsi sodas per day.  States has cut down.  Endorses daily MJ use x yrs.  Eating at home more.  Wt 218 lbs.     Observations/Objective: Patient sounds cheerful and well on the phone. I do not appreciate any SOB. Speech and thought processing are grossly intact. Patient reported vitals: 218 lbs  Assessment and Plan: Hot flashes due to menopause -continue estroven -continue f/u with Gyneocology  Irritable bowel syndrome with diarrhea -colonoscopy done 10/15/18, next one due 10 yrs -pt to keep a food diary -decrease sugar and soda intake -discussed low FODMAP diet -discussed decreasing stress. -consider referral to GI for  continued symptoms   Follow Up Instructions: F/u in 4-6 wks  [id number] 5-10 [id number] 11-20 [id number] 21-30 I did not refer this patient for an OV in the next 24 hours for this/these issue(s).  I discussed the assessment and treatment plan with the patient. The patient was provided an opportunity to ask questions and all were answered. The patient agreed with the plan and demonstrated an understanding of the instructions.   The patient was advised to call back or seek an in-person evaluation if the symptoms worsen or if the condition fails to improve as anticipated.  I provided 28 minutes of non-face-to-face time during this encounter.   Deeann Saint, MD

## 2019-07-12 NOTE — Patient Instructions (Signed)
Menopause Menopause is the normal time of life when menstrual periods stop completely. It is usually confirmed by 12 months without a menstrual period. The transition to menopause (perimenopause) most often happens between the ages of 45 and 55. During perimenopause, hormone levels change in your body, which can cause symptoms and affect your health. Menopause may increase your risk for:  Loss of bone (osteoporosis), which causes bone breaks (fractures).  Depression.  Hardening and narrowing of the arteries (atherosclerosis), which can cause heart attacks and strokes. What are the causes? This condition is usually caused by a natural change in hormone levels that happens as you get older. The condition may also be caused by surgery to remove both ovaries (bilateral oophorectomy). What increases the risk? This condition is more likely to start at an earlier age if you have certain medical conditions or treatments, including:  A tumor of the pituitary gland in the brain.  A disease that affects the ovaries and hormone production.  Radiation treatment for cancer.  Certain cancer treatments, such as chemotherapy or hormone (anti-estrogen) therapy.  Heavy smoking and excessive alcohol use.  Family history of early menopause. This condition is also more likely to develop earlier in women who are very thin. What are the signs or symptoms? Symptoms of this condition include:  Hot flashes.  Irregular menstrual periods.  Night sweats.  Changes in feelings about sex. This could be a decrease in sex drive or an increased comfort around your sexuality.  Vaginal dryness and thinning of the vaginal walls. This may cause painful intercourse.  Dryness of the skin and development of wrinkles.  Headaches.  Problems sleeping (insomnia).  Mood swings or irritability.  Memory problems.  Weight gain.  Hair growth on the face and chest.  Bladder infections or problems with urinating. How  is this diagnosed? This condition is diagnosed based on your medical history, a physical exam, your age, your menstrual history, and your symptoms. Hormone tests may also be done. How is this treated? In some cases, no treatment is needed. You and your health care provider should make a decision together about whether treatment is necessary. Treatment will be based on your individual condition and preferences. Treatment for this condition focuses on managing symptoms. Treatment may include:  Menopausal hormone therapy (MHT).  Medicines to treat specific symptoms or complications.  Acupuncture.  Vitamin or herbal supplements. Before starting treatment, make sure to let your health care provider know if you have a personal or family history of:  Heart disease.  Breast cancer.  Blood clots.  Diabetes.  Osteoporosis. Follow these instructions at home: Lifestyle  Do not use any products that contain nicotine or tobacco, such as cigarettes and e-cigarettes. If you need help quitting, ask your health care provider.  Get at least 30 minutes of physical activity on 5 or more days each week.  Avoid alcoholic and caffeinated beverages, as well as spicy foods. This may help prevent hot flashes.  Get 7-8 hours of sleep each night.  If you have hot flashes, try: ? Dressing in layers. ? Avoiding things that may trigger hot flashes, such as spicy food, warm places, or stress. ? Taking slow, deep breaths when a hot flash starts. ? Keeping a fan in your home and office.  Find ways to manage stress, such as deep breathing, meditation, or journaling.  Consider going to group therapy with other women who are having menopause symptoms. Ask your health care provider about recommended group therapy meetings. Eating and   drinking  Eat a healthy, balanced diet that contains whole grains, lean protein, low-fat dairy, and plenty of fruits and vegetables.  Your health care provider may recommend  adding more soy to your diet. Foods that contain soy include tofu, tempeh, and soy milk.  Eat plenty of foods that contain calcium and vitamin D for bone health. Items that are rich in calcium include low-fat milk, yogurt, beans, almonds, sardines, broccoli, and kale. Medicines  Take over-the-counter and prescription medicines only as told by your health care provider.  Talk with your health care provider before starting any herbal supplements. If prescribed, take vitamins and supplements as told by your health care provider. These may include: ? Calcium. Women age 50 and older should get 1,200 mg (milligrams) of calcium every day. ? Vitamin D. Women need 600-800 International Units of vitamin D each day. ? Vitamins B12 and B6. Aim for 50 micrograms of B12 and 1.5 mg of B6 each day. General instructions  Keep track of your menstrual periods, including: ? When they occur. ? How heavy they are and how long they last. ? How much time passes between periods.  Keep track of your symptoms, noting when they start, how often you have them, and how long they last.  Use vaginal lubricants or moisturizers to help with vaginal dryness and improve comfort during sex.  Keep all follow-up visits as told by your health care provider. This is important. This includes any group therapy or counseling. Contact a health care provider if:  You are still having menstrual periods after age 47.  You have pain during sex.  You have not had a period for 12 months and you develop vaginal bleeding. Get help right away if:  You have: ? Severe depression. ? Excessive vaginal bleeding. ? Pain when you urinate. ? A fast or irregular heart beat (palpitations). ? Severe headaches. ? Abdomen (abdominal) pain or severe indigestion.  You fell and you think you have a broken bone.  You develop leg or chest pain.  You develop vision problems.  You feel a lump in your breast. Summary  Menopause is the normal  time of life when menstrual periods stop completely. It is usually confirmed by 12 months without a menstrual period.  The transition to menopause (perimenopause) most often happens between the ages of 65 and 5.  Symptoms can be managed through medicines, lifestyle changes, and complementary therapies such as acupuncture.  Eat a balanced diet that is rich in nutrients to promote bone health and heart health and to manage symptoms during menopause. This information is not intended to replace advice given to you by your health care provider. Make sure you discuss any questions you have with your health care provider. Document Revised: 04/14/2017 Document Reviewed: 06/04/2016 Elsevier Patient Education  2020 [place].  Diet for Irritable Bowel Syndrome When you have irritable bowel syndrome (IBS), it is very important to eat the foods and follow the eating habits that are best for your condition. IBS may cause various symptoms such as pain in the abdomen, constipation, or diarrhea. Choosing the right foods can help to ease the discomfort from these symptoms. Work with your health care provider and diet and nutrition specialist (dietitian) to find the eating plan that will help to control your symptoms. What are tips for following this plan?      Keep a food diary. This will help you identify foods that cause symptoms. Write down: ? What you eat and when you eat  it. ? What symptoms you have. ? When symptoms occur in relation to your meals, such as "pain in abdomen 2 hours after dinner."  Eat your meals slowly and in a relaxed setting.  Aim to eat 5-6 small meals per day. Do not skip meals.  Drink enough fluid to keep your urine pale yellow.  Ask your health care provider if you should take an over-the-counter probiotic to help restore healthy bacteria in your gut (digestive tract). ? Probiotics are foods that contain good bacteria and yeasts.  Your dietitian may have specific  dietary recommendations for you based on your symptoms. He or she may recommend that you: ? Avoid foods that cause symptoms. Talk with your dietitian about other ways to get the same nutrients that are in those problem foods. ? Avoid foods with gluten. Gluten is a protein that is found in rye, wheat, and barley. ? Eat more foods that contain soluble fiber. Examples of foods with high soluble fiber include oats, seeds, and certain fruits and vegetables. Take a fiber supplement if directed by your dietitian. ? Reduce or avoid certain foods called FODMAPs. These are foods that contain carbohydrates that are hard to digest. Ask your doctor which foods contain these carbohydrates. What foods are not recommended? The following are some foods and drinks that may make your symptoms worse:  Fatty foods, such as french fries.  Foods that contain gluten, such as pasta and cereal.  Dairy products, such as milk, cheese, and ice cream.  Chocolate.  Alcohol.  Products with caffeine, such as coffee.  Carbonated drinks, such as soda.  Foods that are high in FODMAPs. These include certain fruits and vegetables.  Products with sweeteners such as honey, high fructose corn syrup, sorbitol, and mannitol. The items listed above may not be a complete list of foods and beverages you should avoid. Contact a dietitian for more information. What foods are good sources of fiber? Your health care provider or dietitian may recommend that you eat more foods that contain fiber. Fiber can help to reduce constipation and other IBS symptoms. Add foods with fiber to your diet a little at a time so your body can get used to them. Too much fiber at one time might cause gas and swelling of your abdomen. The following are some foods that are good sources of fiber:  Berries, such as raspberries, strawberries, and blueberries.  Tomatoes.  Carrots.  Brown rice.  Oats.  Seeds, such as chia and pumpkin seeds. The items  listed above may not be a complete list of recommended sources of fiber. Contact your dietitian for more options. Where to find more information  International Foundation for Functional Gastrointestinal Disorders: www.iffgd.[employer] of Diabetes and Digestive and Kidney Diseases: [employer]tn Summary  When you have irritable bowel syndrome (IBS), it is very important to eat the foods and follow the eating habits that are best for your condition.  IBS may cause various symptoms such as pain in the abdomen, constipation, or diarrhea.  Choosing the right foods can help to ease the discomfort that comes from symptoms.  Keep a food diary. This will help you identify foods that cause symptoms.  Your health care provider or diet and nutrition specialist (dietitian) may recommend that you eat more foods that contain fiber. This information is not intended to replace advice given to you by your health care provider. Make sure you discuss any questions you have with your health care provider. Document Revised: 08/22/2018 Document Reviewed:  01/03/2017 Elsevier Patient Education  2020 [employer] Inc.  Irritable Bowel Syndrome, Adult  Irritable bowel syndrome (IBS) is a group of symptoms that affects the organs responsible for digestion (gastrointestinal or GI tract). IBS is not one specific disease. To regulate how the GI tract works, the body sends signals back and forth between the intestines and the brain. If you have IBS, there may be a problem with these signals. As a result, the GI tract does not function normally. The intestines may become more sensitive and overreact to certain things. This may be especially true when you eat certain foods or when you are under stress. There are four types of IBS. These may be determined based on the consistency of your stool (feces):  IBS with diarrhea.  IBS with constipation.  Mixed IBS.  Unsubtyped IBS. It is important to know which  type of IBS you have. Certain treatments are more likely to be helpful for certain types of IBS. What are the causes? The exact cause of IBS is not known. What increases the risk? You may have a higher risk for IBS if you:  Are female.  Are younger than 57.  Have a family history of IBS.  Have a mental health condition, such as depression, anxiety, or post-traumatic stress disorder.  Have had a bacterial infection of your GI tract. What are the signs or symptoms? Symptoms of IBS vary from person to person. The main symptom is abdominal pain or discomfort. Other symptoms usually include one or more of the following:  Diarrhea, constipation, or both.  Abdominal swelling or bloating.  Feeling full after eating a small or regular-sized meal.  Frequent gas.  Mucus in the stool.  A feeling of having more stool left after a bowel movement. Symptoms tend to come and go. They may be triggered by stress, mental health conditions, or certain foods. How is this diagnosed? This condition may be diagnosed based on a physical exam, your medical history, and your symptoms. You may have tests, such as:  Blood tests.  Stool test.  X-rays.  CT scan.  Colonoscopy. This is a procedure in which your GI tract is viewed with a long, thin, flexible tube. How is this treated? There is no cure for IBS, but treatment can help relieve symptoms. Treatment depends on the type of IBS you have, and may include:  Changes to your diet, such as: ? Avoiding foods that cause symptoms. ? Drinking more water. ? Following a low-FODMAP (fermentable oligosaccharides, disaccharides, monosaccharides, and polyols) diet for up to 6 weeks, or as told by your health care provider. FODMAPs are sugars that are hard for some people to digest. ? Eating more fiber. ? Eating medium-sized meals at the same times every day.  Medicines. These may include: ? Fiber supplements, if you have constipation. ? Medicine to  control diarrhea (antidiarrheal medicines). ? Medicine to help control muscle tightening (spasms) in your GI tract (antispasmodic medicines). ? Medicines to help with mental health conditions, such as antidepressants or tranquilizers.  Talk therapy or counseling.  Working with a diet and nutrition specialist (dietitian) to help create a food plan that is right for you.  Managing your stress. Follow these instructions at home: Eating and drinking  Eat a healthy diet.  Eat medium-sized meals at about the same time every day. Do not eat large meals.  Gradually eat more fiber-rich foods. These include whole grains, fruits, and vegetables. This may be especially helpful if you have IBS with constipation.  Eat a diet low in FODMAPs.  Drink enough fluid to keep your urine pale yellow.  Keep a journal of foods that seem to trigger symptoms.  Avoid foods and drinks that: ? Contain added sugar. ? Make your symptoms worse. Dairy products, caffeinated drinks, and carbonated drinks can make symptoms worse for some people. General instructions  Take over-the-counter and prescription medicines and supplements only as told by your health care provider.  Get enough exercise. Do at least 150 minutes of moderate-intensity exercise each week.  Manage your stress. Getting enough sleep and exercise can help you manage stress.  Keep all follow-up visits as told by your health care provider and therapist. This is important. Alcohol Use  Do not drink alcohol if: ? Your health care provider tells you not to drink. ? You are pregnant, may be pregnant, or are planning to become pregnant.  If you drink alcohol, limit how much you have: ? 0-1 drink a day for women. ? 0-2 drinks a day for men.  Be aware of how much alcohol is in your drink. In the [place] one drink equals one typical bottle of beer (12 oz), one-half glass of wine (5 oz), or one shot of hard liquor (1 oz). Contact a health care  provider if you have:  Constant pain.  Weight loss.  Difficulty or pain when swallowing.  Diarrhea that gets worse. Get help right away if you have:  Severe abdominal pain.  Fever.  Diarrhea with symptoms of dehydration, such as dizziness or dry mouth.  Bright red blood in your stool.  Stool that is black and tarry.  Abdominal swelling.  Vomiting that does not stop.  Blood in your vomit. Summary  Irritable bowel syndrome (IBS) is not one specific disease. It is a group of symptoms that affects digestion.  Your intestines may become more sensitive and overreact to certain things. This may be especially true when you eat certain foods or when you are under stress.  There is no cure for IBS, but treatment can help relieve symptoms. This information is not intended to replace advice given to you by your health care provider. Make sure you discuss any questions you have with your health care provider. Document Revised: 04/25/2017 Document Reviewed: 04/25/2017 Elsevier Patient Education  2020 [employer].

## 2019-07-23 ENCOUNTER — Other Ambulatory Visit: Payer: Self-pay

## 2019-07-23 ENCOUNTER — Encounter: Payer: Self-pay | Admitting: Podiatry

## 2019-07-23 ENCOUNTER — Ambulatory Visit (INDEPENDENT_AMBULATORY_CARE_PROVIDER_SITE_OTHER): Payer: 59 | Admitting: Podiatry

## 2019-07-23 DIAGNOSIS — M722 Plantar fascial fibromatosis: Secondary | ICD-10-CM | POA: Diagnosis not present

## 2019-07-23 NOTE — Progress Notes (Signed)
She presents today for follow-up of her plantar fasciitis left foot.  States that is a lot better really not any pain anymore.  Continues to utilize her plantar fascial brace her night splint and her medication.  She continues to wear her shoes and wear shoes even in the house.  100% improved  Objective: Vital signs are stable alert oriented x3 pulses are great left lower extremity.  She has no pain on palpation medial calcaneal tubercle left.  Assessment: Well-healing plantar fasciitis, 100% improved  Plan: Continue all conservative therapies mentioned both above for at least 1 month then taper.

## 2019-09-11 ENCOUNTER — Other Ambulatory Visit: Payer: Self-pay

## 2019-09-12 ENCOUNTER — Ambulatory Visit (INDEPENDENT_AMBULATORY_CARE_PROVIDER_SITE_OTHER): Payer: 59

## 2019-09-12 DIAGNOSIS — Z23 Encounter for immunization: Secondary | ICD-10-CM

## 2019-09-12 NOTE — Patient Instructions (Signed)
Health Maintenance Due  Topic Date Due  . COVID-19 Vaccine (1) Never done  . MAMMOGRAM  04/22/2018  . TETANUS/TDAP  11/26/2018    Depression screen PHQ 2/9 04/11/2018 04/12/2017 02/26/2015  Decreased Interest 0 0 0  Down, Depressed, Hopeless 0 0 0  PHQ - 2 Score 0 0 0  Altered sleeping - 0 -  Tired, decreased energy - 0 -  Change in appetite - 0 -  Feeling bad or failure about yourself  - 0 -  Trouble concentrating - 0 -  Moving slowly or fidgety/restless - 0 -  Suicidal thoughts - 0 -  PHQ-9 Score - 0 -

## 2019-09-12 NOTE — Progress Notes (Signed)
Per orders of Dr. Salomon Fick, 2nd injection of Shingrix administered given by Sherrin Daisy. Patient tolerated injection well.

## 2019-10-03 ENCOUNTER — Telehealth: Payer: Self-pay | Admitting: Family Medicine

## 2019-10-03 NOTE — Telephone Encounter (Signed)
Pt is calling in stating that she has had her last shingle vaccine and wanted to know when she will be able to have her COVID vaccine.  Pt would like to have a call back to let her know so that she can schedule her COVID vaccine.

## 2019-10-07 NOTE — Telephone Encounter (Signed)
Send a message to pt through MyChart portal

## 2020-01-18 ENCOUNTER — Telehealth: Payer: 59 | Admitting: Family

## 2020-01-18 DIAGNOSIS — J069 Acute upper respiratory infection, unspecified: Secondary | ICD-10-CM

## 2020-01-19 MED ORDER — FLUTICASONE PROPIONATE 50 MCG/ACT NA SUSP: 2.0000 | Freq: Every day | NASAL | 6 refills | Status: AC

## 2020-01-19 NOTE — Progress Notes (Signed)
We are sorry you are not feeling well.  Here is how we plan to help!  Based on what you have shared with me, it looks like you may have a viral upper respiratory infection.  Upper respiratory infections are caused by a large number of viruses; however, rhinovirus is the most common cause.   Symptoms vary from person to person, with common symptoms including sore throat, cough, fatigue or lack of energy and feeling of general discomfort.  A low-grade fever of up to 100.4 may present, but is often uncommon.  Symptoms vary however, and are closely related to a person's age or underlying illnesses.  The most common symptoms associated with an upper respiratory infection are nasal discharge or congestion, cough, sneezing, headache and pressure in the ears and face.  These symptoms usually persist for about 3 to 10 days, but can last up to 2 weeks.  It is important to know that upper respiratory infections do not cause serious illness or complications in most cases.    Upper respiratory infections can be transmitted from person to person, with the most common method of transmission being a person's hands.  The virus is able to live on the skin and can infect other persons for up to 2 hours after direct contact.  Also, these can be transmitted when someone coughs or sneezes; thus, it is important to cover the mouth to reduce this risk.  To keep the spread of the illness at bay, good hand hygiene is very important.  This is an infection that is most likely caused by a virus. There are no specific treatments other than to help you with the symptoms until the infection runs its course.  We are sorry you are not feeling well.  Here is how we plan to help!   For nasal congestion, you may use an oral decongestants such as Mucinex D or if you have glaucoma or high blood pressure use plain Mucinex.  Saline nasal spray or nasal drops can help and can safely be used as often as needed for congestion.  For your congestion,  I have prescribed Fluticasone nasal spray one spray in each nostril twice a day.   You need to be COVID tested to rule this out.   If you do not have a history of heart disease, hypertension, diabetes or thyroid disease, prostate/bladder issues or glaucoma, you may also use Sudafed to treat nasal congestion.  It is highly recommended that you consult with a pharmacist or your primary care physician to ensure this medication is safe for you to take.     If you have a cough, you may use cough suppressants such as Delsym and Robitussin.  If you have glaucoma or high blood pressure, you can also use Coricidin HBP.     If you have a sore or scratchy throat, use a saltwater gargle-  to  teaspoon of salt dissolved in a 4-ounce to 8-ounce glass of warm water.  Gargle the solution for approximately 15-30 seconds and then spit.  It is important not to swallow the solution.  You can also use throat lozenges/cough drops and Chloraseptic spray to help with throat pain or discomfort.  Warm or cold liquids can also be helpful in relieving throat pain.  For headache, pain or general discomfort, you can use Ibuprofen or Tylenol as directed.   Some authorities believe that zinc sprays or the use of Echinacea may shorten the course of your symptoms.   HOME CARE . Only  take medications as instructed by your medical team. . Be sure to drink plenty of fluids. Water is fine as well as fruit juices, sodas and electrolyte beverages. You may want to stay away from caffeine or alcohol. If you are nauseated, try taking small sips of liquids. How do you know if you are getting enough fluid? Your urine should be a pale yellow or almost colorless. . Get rest. . Taking a steamy shower or using a humidifier may help nasal congestion and ease sore throat pain. You can place a towel over your head and breathe in the steam from hot water coming from a faucet. . Using a saline nasal spray works much the same way. . Cough drops,  hard candies and sore throat lozenges may ease your cough. . Avoid close contacts especially the very young and the elderly . Cover your mouth if you cough or sneeze . Always remember to wash your hands.   GET HELP RIGHT AWAY IF: . You develop worsening fever. . If your symptoms do not improve within 10 days . You develop yellow or green discharge from your nose over 3 days. . You have coughing fits . You develop a severe head ache or visual changes. . You develop shortness of breath, difficulty breathing or start having chest pain . Your symptoms persist after you have completed your treatment plan  MAKE SURE YOU   Understand these instructions.  Will watch your condition.  Will get help right away if you are not doing well or get worse.  Your e-visit answers were reviewed by a board certified advanced clinical practitioner to complete your personal care plan. Depending upon the condition, your plan could have included both over the counter or prescription medications. Please review your pharmacy choice. If there is a problem, you may call our nursing hot line at and have the prescription routed to another pharmacy. Your safety is important to [place]. If you have drug allergies check your prescription carefully.   You can use MyChart to ask questions about today's visit, request a non-urgent call back, or ask for a work or school excuse for 24 hours related to this e-Visit. If it has been greater than 24 hours you will need to follow up with your provider, or enter a new e-Visit to address those concerns. You will get an e-mail in the next two days asking about your experience.  I hope that your e-visit has been valuable and will speed your recovery. Thank you for using e-visits.   Approximately 5 minutes was spent documenting and reviewing patient's chart.

## 2020-01-24 ENCOUNTER — Encounter: Payer: Self-pay | Admitting: Family Medicine

## 2020-01-24 NOTE — Telephone Encounter (Signed)
Please advise 

## 2020-01-30 NOTE — Telephone Encounter (Signed)
Thank you your COVID Vaccine has been updated in your chart.

## 2020-09-09 ENCOUNTER — Other Ambulatory Visit: Payer: Self-pay

## 2020-09-10 ENCOUNTER — Ambulatory Visit (INDEPENDENT_AMBULATORY_CARE_PROVIDER_SITE_OTHER): Payer: 59 | Admitting: Family Medicine

## 2020-09-10 ENCOUNTER — Encounter: Payer: Self-pay | Admitting: Family Medicine

## 2020-09-10 VITALS — BP 120/90 | HR 69 | Temp 98.3°F | Ht 64.5 in | Wt 239.0 lb

## 2020-09-10 DIAGNOSIS — J302 Other seasonal allergic rhinitis: Secondary | ICD-10-CM

## 2020-09-10 DIAGNOSIS — Z6839 Body mass index (BMI) 39.0-39.9, adult: Secondary | ICD-10-CM

## 2020-09-10 DIAGNOSIS — E6609 Other obesity due to excess calories: Secondary | ICD-10-CM | POA: Diagnosis not present

## 2020-09-10 DIAGNOSIS — R030 Elevated blood-pressure reading, without diagnosis of hypertension: Secondary | ICD-10-CM

## 2020-09-10 DIAGNOSIS — Z0000 Encounter for general adult medical examination without abnormal findings: Secondary | ICD-10-CM | POA: Diagnosis not present

## 2020-09-10 DIAGNOSIS — E559 Vitamin D deficiency, unspecified: Secondary | ICD-10-CM | POA: Diagnosis not present

## 2020-09-10 DIAGNOSIS — Z1159 Encounter for screening for other viral diseases: Secondary | ICD-10-CM

## 2020-09-10 DIAGNOSIS — E782 Mixed hyperlipidemia: Secondary | ICD-10-CM | POA: Diagnosis not present

## 2020-09-10 LAB — CBC WITH DIFFERENTIAL/PLATELET
Basophils Absolute: 0 10*3/uL (ref 0.0–0.1)
Basophils Relative: 0.3 % (ref 0.0–3.0)
Eosinophils Absolute: 0.4 10*3/uL (ref 0.0–0.7)
Eosinophils Relative: 6.5 % — ABNORMAL HIGH (ref 0.0–5.0)
HCT: 43.3 % (ref 36.0–46.0)
Hemoglobin: 14.9 g/dL (ref 12.0–15.0)
Lymphocytes Relative: 50.5 % — ABNORMAL HIGH (ref 12.0–46.0)
Lymphs Abs: 3.1 10*3/uL (ref 0.7–4.0)
MCHC: 34.3 g/dL (ref 30.0–36.0)
MCV: 92.1 fl (ref 78.0–100.0)
Monocytes Absolute: 0.4 10*3/uL (ref 0.1–1.0)
Monocytes Relative: 6.4 % (ref 3.0–12.0)
Neutro Abs: 2.2 10*3/uL (ref 1.4–7.7)
Neutrophils Relative %: 36.3 % — ABNORMAL LOW (ref 43.0–77.0)
Platelets: 209 10*3/uL (ref 150.0–400.0)
RBC: 4.7 Mil/uL (ref 3.87–5.11)
RDW: 13.3 % (ref 11.5–15.5)
WBC: 6 10*3/uL (ref 4.0–10.5)

## 2020-09-10 LAB — BASIC METABOLIC PANEL
BUN: 14 mg/dL (ref 6–23)
CO2: 26 mEq/L (ref 19–32)
Calcium: 9.9 mg/dL (ref 8.4–10.5)
Chloride: 104 mEq/L (ref 96–112)
Creatinine, Ser: 1.02 mg/dL (ref 0.40–1.20)
GFR: 63.31 mL/min (ref >60.00)
Glucose, Bld: 87 mg/dL (ref 70–99)
Potassium: 4.6 mEq/L (ref 3.5–5.1)
Sodium: 140 mEq/L (ref 135–145)

## 2020-09-10 LAB — LIPID PANEL
Cholesterol: 219 mg/dL — ABNORMAL HIGH (ref 0–200)
HDL: 46.6 mg/dL (ref >39.00)
LDL Cholesterol: 152 mg/dL — ABNORMAL HIGH (ref 0–99)
NonHDL: 172.65
Total CHOL/HDL Ratio: 5
Triglycerides: 105 mg/dL (ref 0.0–149.0)
VLDL: 21 mg/dL (ref 0.0–40.0)

## 2020-09-10 LAB — T4, FREE: Free T4: 0.61 ng/dL (ref 0.60–1.60)

## 2020-09-10 LAB — VITAMIN D 25 HYDROXY (VIT D DEFICIENCY, FRACTURES): VITD: 15.05 ng/mL — ABNORMAL LOW (ref 30.00–100.00)

## 2020-09-10 LAB — HEMOGLOBIN A1C: Hgb A1c MFr Bld: 5.7 % (ref 4.6–6.5)

## 2020-09-10 LAB — TSH: TSH: 4.14 u[IU]/mL (ref 0.35–4.50)

## 2020-09-10 NOTE — Progress Notes (Signed)
Subjective:     Lauren Fox is a 55 y.o. female and is here for a comprehensive physical exam. The patient reports that she is doing well.  She is currently taking care of her mother who has stage IV lung cancer with mets to the brain.  Patient's mother is currently undergoing chemo and radiation but is doing well.  Patient states her mother now lives in her home with her and her husband.  Patient currently working from home.  Pt endorses weight gain.  Trying to eat better but finds it difficult as she does not want to have to cook separate meals for the rest of her household.  Pt with a h/o HLD, not currently on meds.  Notes allergy symptoms.  Taking provitalize for menopause symptoms.    Social History   Socioeconomic History  . Marital status: Married    Spouse name: Not on file  . Number of children: 3  . Years of education: Not on file  . Highest education level: Not on file  Occupational History  . Occupation: inpt. coordinator Advice worker: [employer]  Tobacco Use  . Smoking status: Former Smoker    Types: Cigarettes    Quit date: 05/16/2012    Years since quitting: 8.3  . Smokeless tobacco: Never Used  . Tobacco comment: pt verbalized never smoking  Substance and Sexual Activity  . Alcohol use: Yes    Alcohol/week: 0.0 standard drinks    Comment: social  . Drug use: No    Types: Benzodiazepines  . Sexual activity: Not on file  Other Topics Concern  . Not on file  Social History Narrative  . Not on file   Social Determinants of Health   Financial Resource Strain: Not on file  Food Insecurity: Not on file  Transportation Needs: Not on file  Physical Activity: Not on file  Stress: Not on file  Social Connections: Not on file  Intimate Partner Violence: Not on file   Health Maintenance  Topic Date Due  . Hepatitis C Screening  Never done  . MAMMOGRAM  04/22/2018  . TETANUS/TDAP  11/26/2018  . COVID-19 Vaccine (3 - Booster for Pfizer  series) 08/02/2020  . INFLUENZA VACCINE  12/14/2020  . COLONOSCOPY (Pts 45-31yrs Insurance coverage will need to be confirmed)  10/14/2028  . HIV Screening  Completed  . HPV VACCINES  Aged Out    The following portions of the patient's history were reviewed and updated as appropriate: allergies, current medications, past family history, past medical history, past social history, past surgical history and problem list.  Review of Systems Pertinent items noted in HPI and remainder of comprehensive ROS otherwise negative.   Objective:    BP 120/90 (BP Location: Left Arm, Patient Position: Sitting, Cuff Size: Normal)   Pulse 69   Temp 98.3 F (36.8 C) (Oral)   Ht 5' 4.5" (1.638 m)   Wt 239 lb (108.4 kg)   SpO2 96%   BMI 40.39 kg/m  General appearance: alert, cooperative and no distress Head: Normocephalic, without obvious abnormality, atraumatic Eyes: negative findings: lids and lashes normal, conjunctivae and sclerae normal and pupils equal, round, reactive to light and accomodation Ears: normal TM's and external ear canals both ears Nose: Nares normal. Septum midline. Mucosa normal. No drainage or sinus tenderness. Throat: lips, mucosa, and tongue normal; teeth and gums normal Neck: no adenopathy, no carotid bruit, no JVD, supple, symmetrical, trachea midline and thyroid not enlarged, symmetric, no  tenderness/mass/nodules Lungs: clear to auscultation bilaterally Heart: regular rate and rhythm, S1, S2 normal, no murmur, click, rub or gallop Abdomen: soft, non-tender; bowel sounds normal; no masses,  no organomegaly Extremities: extremities normal, atraumatic, no cyanosis or edema Pulses: 2+ and symmetric Skin: Skin color, texture, turgor normal. No rashes or lesions Lymph nodes: Cervical, supraclavicular, and axillary nodes normal. Neurologic: Alert and oriented X 3, normal strength and tone. Normal symmetric reflexes. Normal coordination and gait    Assessment:    Healthy  female exam with weight gain.     Plan:     Anticipatory guidance given including wearing seatbelts, smoke detectors in the home, increasing physical activity, increasing p.o. intake of water and vegetables. -will obtain labs -pt advised to schedule mammogram -colonoscopy up to date done 10/15/2018 -Given handout -Next CPE in 1 year See After Visit Summary for Counseling Recommendations   Vitamin D deficiency  - Plan: Vitamin D, 25-hydroxy  Seasonal allergies  -OTC antihistamine or nasal spray/months as needed  Need for hepatitis C screening test  - Plan: Hep C Antibody  Class 2 obesity due to excess calories without serious comorbidity with body mass index (BMI) of 39.0 to 39.9 in adult -Lifestyle modifications encouraged - Plan: TSH, T4, Free, Hemoglobin A1c, Vitamin D, 25-hydroxy  Mixed hyperlipidemia -Lifestyle modifications - Plan: Lipid panel  Elevated blood pressure reading in office without diagnosis of hypertension -Lifestyle modifications encouraged including reducing sodium intake and increasing physical activity -Patient encouraged to obtain BP cuff for home monitoring -Plan: BMP, CBC  Follow-up in 1-2 months, sooner if needed  Abbe Amsterdam, MD

## 2020-09-10 NOTE — Patient Instructions (Addendum)
You can try Zyxal for your allergy symptoms in addition to the flonase.  Preventive Care 49-53 Years Old, Female Preventive care refers to lifestyle choices and visits with your health care provider that can promote health and wellness. This includes:  A yearly physical exam. This is also called an annual wellness visit.  Regular dental and eye exams.  Immunizations.  Screening for certain conditions.  Healthy lifestyle choices, such as: ? Eating a healthy diet. ? Getting regular exercise. ? Not using drugs or products that contain nicotine and tobacco. ? Limiting alcohol use. What can I expect for my preventive care visit? Physical exam Your health care provider will check your:  Height and weight. These may be used to calculate your BMI (body mass index). BMI is a measurement that tells if you are at a healthy weight.  Heart rate and blood pressure.  Body temperature.  Skin for abnormal spots. Counseling Your health care provider may ask you questions about your:  Past medical problems.  Family's medical history.  Alcohol, tobacco, and drug use.  Emotional well-being.  Home life and relationship well-being.  Sexual activity.  Diet, exercise, and sleep habits.  Work and work Statistician.  Access to firearms.  Method of birth control.  Menstrual cycle.  Pregnancy history. What immunizations do I need? Vaccines are usually given at various ages, according to a schedule. Your health care provider will recommend vaccines for you based on your age, medical history, and lifestyle or other factors, such as travel or where you work.   What tests do I need? Blood tests  Lipid and cholesterol levels. These may be checked every 5 years, or more often if you are over 69 years old.  Hepatitis C test.  Hepatitis B test. Screening  Lung cancer screening. You may have this screening every year starting at age 73 if you have a 30-pack-year history of smoking and  currently smoke or have quit within the past 15 years.  Colorectal cancer screening. ? All adults should have this screening starting at age 4 and continuing until age 16. ? Your health care provider may recommend screening at age 88 if you are at increased risk. ? You will have tests every 1-10 years, depending on your results and the type of screening test.  Diabetes screening. ? This is done by checking your blood sugar (glucose) after you have not eaten for a while (fasting). ? You may have this done every 1-3 years.  Mammogram. ? This may be done every 1-2 years. ? Talk with your health care provider about when you should start having regular mammograms. This may depend on whether you have a family history of breast cancer.  BRCA-related cancer screening. This may be done if you have a family history of breast, ovarian, tubal, or peritoneal cancers.  Pelvic exam and Pap test. ? This may be done every 3 years starting at age 35. ? Starting at age 40, this may be done every 5 years if you have a Pap test in combination with an HPV test. Other tests  STD (sexually transmitted disease) testing, if you are at risk.  Bone density scan. This is done to screen for osteoporosis. You may have this scan if you are at high risk for osteoporosis. Talk with your health care provider about your test results, treatment options, and if necessary, the need for more tests. Follow these instructions at home: Eating and drinking  Eat a diet that includes fresh fruits and vegetables,  whole grains, lean protein, and low-fat dairy products.  Take vitamin and mineral supplements as recommended by your health care provider.  Do not drink alcohol if: ? Your health care provider tells you not to drink. ? You are pregnant, may be pregnant, or are planning to become pregnant.  If you drink alcohol: ? Limit how much you have to 0-1 drink a day. ? Be aware of how much alcohol is in your drink. In the  [place] one drink equals one 12 oz bottle of beer (355 mL), one 5 oz glass of wine (148 mL), or one 1 oz glass of hard liquor (44 mL).   Lifestyle  Take daily care of your teeth and gums. Brush your teeth every morning and night with fluoride toothpaste. Floss one time each day.  Stay active. Exercise for at least 30 minutes 5 or more days each week.  Do not use any products that contain nicotine or tobacco, such as cigarettes, e-cigarettes, and chewing tobacco. If you need help quitting, ask your health care provider.  Do not use drugs.  If you are sexually active, practice safe sex. Use a condom or other form of protection to prevent STIs (sexually transmitted infections).  If you do not wish to become pregnant, use a form of birth control. If you plan to become pregnant, see your health care provider for a prepregnancy visit.  If told by your health care provider, take low-dose aspirin daily starting at age 22.  Find healthy ways to cope with stress, such as: ? Meditation, yoga, or listening to music. ? Journaling. ? Talking to a trusted person. ? Spending time with friends and family. Safety  Always wear your seat belt while driving or riding in a vehicle.  Do not drive: ? If you have been drinking alcohol. Do not ride with someone who has been drinking. ? When you are tired or distracted. ? While texting.  Wear a helmet and other protective equipment during sports activities.  If you have firearms in your house, make sure you follow all gun safety procedures. What's next?  Visit your health care provider once a year for an annual wellness visit.  Ask your health care provider how often you should have your eyes and teeth checked.  Stay up to date on all vaccines. This information is not intended to replace advice given to you by your health care provider. Make sure you discuss any questions you have with your health care provider. Document Revised: 02/04/2020 Document  Reviewed: 01/11/2018 Elsevier Patient Education  2021 [place].  Vitamin D Deficiency Vitamin D deficiency is when your body does not have enough vitamin D. Vitamin D is important to your body because:  It helps your body use other minerals.  It helps to keep your bones strong and healthy.  It may help to prevent some diseases.  It helps your heart and other muscles work well. Not getting enough vitamin D can make your bones soft. It can also cause other health problems. What are the causes? This condition may be caused by:  Not eating enough foods that contain vitamin D.  Not getting enough sun.  Having diseases that make it hard for your body to absorb vitamin D.  Having a surgery in which a part of the stomach or a part of the small intestine is removed.  Having kidney disease or liver disease. What increases the risk? You are more likely to get this condition if:  You are older.  You do not spend much time outdoors.  You live in a nursing home.  You have had broken bones.  You have weak or thin bones (osteoporosis).  You have a disease or condition that changes how your body absorbs vitamin D.  You have dark skin.  You take certain medicines.  You are overweight or obese. What are the signs or symptoms?  In mild cases, there may not be any symptoms. If the condition is very bad, symptoms may include: ? Bone pain. ? Muscle pain. ? Falling often. ? Broken bones caused by a minor injury. How is this treated? Treatment may include taking supplements as told by your doctor. Your doctor will tell you what dose is best for you. Supplements may include:  Vitamin D.  Calcium. Follow these instructions at home: Eating and drinking  Eat foods that contain vitamin D, such as: ? Dairy products, cereals, or juices with added vitamin D. Check the label. ? Fish, such as salmon or trout. ? Eggs. ? Oysters. ? Mushrooms. The items listed above may not be a  complete list of what you can eat and drink. Contact a dietitian for more options.   General instructions  Take medicines and supplements only as told by your doctor.  Get regular, safe exposure to natural sunlight.  Do not use a tanning bed.  Maintain a healthy weight. Lose weight if needed.  Keep all follow-up visits as told by your doctor. This is important. How is this prevented?  You can get vitamin D by: ? Eating foods that naturally contain vitamin D. ? Eating or drinking products that have vitamin D added to them, such as cereals, juices, and milk. ? Taking vitamin D or a multivitamin that contains vitamin D. ? Being in the sun. Your body makes vitamin D when your skin is exposed to sunlight. Your body changes the sunlight into a form of the vitamin that it can use. Contact a doctor if:  Your symptoms do not go away.  You feel sick to your stomach (nauseous).  You throw up (vomit).  You poop less often than normal, or you have trouble pooping (constipation). Summary  Vitamin D deficiency is when your body does not have enough vitamin D.  Vitamin D helps to keep your bones strong and healthy.  This condition is often treated by taking a supplement.  Your doctor will tell you what dose is best for you. This information is not intended to replace advice given to you by your health care provider. Make sure you discuss any questions you have with your health care provider. Document Revised: 01/08/2018 Document Reviewed: 01/08/2018 Elsevier Patient Education  2021 [place].  Allergic Rhinitis, Adult Allergic rhinitis is a reaction to allergens. Allergens are things that can cause an allergic reaction. This condition affects the lining inside the nose (mucous membrane). There are two types of allergic rhinitis:  Seasonal. This type is also called hay fever. It happens only during some times of the year.  Perennial. This type can happen at any time of the  year. This condition cannot be spread from person to person (is not contagious). It can be mild, worse, or very bad. It can develop at any age and may be outgrown. What are the causes? This condition may be caused by:  Pollen from grasses, trees, and weeds.  Dust mites.  Smoke.  Mold.  Car fumes.  The pee (urine), spit, or dander of pets. Dander is dead skin cells from  a pet.   What increases the risk? You are more likely to develop this condition if:  You have allergies in your family.  You have problems like allergies in your family. You may have: ? Swelling of parts of your eyes and eyelids. ? Asthma. This affects how you breathe. ? Long-term redness and swelling on your skin. ? Food allergies. What are the signs or symptoms? The main symptom of this condition is a runny or stuffy nose (nasal congestion). Other symptoms may include:  Sneezing or coughing.  Itching and tearing of your eyes.  Mucus that drips down the back of your throat (postnasal drip).  Trouble sleeping.  Feeling tired.  Headache.  Sore throat. How is this treated? There is no cure for this condition. You should avoid things that you are allergic to. Treatment can help to relieve symptoms. This may include:  Medicines that block allergy symptoms, such as corticosteroids or antihistamines. These may be given as a shot, nasal spray, or pill.  Avoiding things you are allergic to.  Medicines that give you bits of what you are allergic to over time. This is called immunotherapy. It is done if other treatments do not help. You may get: ? Shots. ? Medicine under your tongue.  Stronger medicines, if other treatments do not help. Follow these instructions at home: Avoiding allergens Find out what things you are allergic to and avoid them. To do this, try these things:  If you get allergies any time of year: ? Replace carpet with wood, tile, or vinyl flooring. Carpet can trap pet dander and  dust. ? Do not smoke. Do not allow smoking in your home. ? Change your heating and air conditioning filters at least once a month.  If you get allergies only some times of the year: ? Keep windows closed when you can. ? Plan things to do outside when pollen counts are lowest. Check pollen counts before you plan things to do outside. ? When you come indoors, change your clothes and shower before you sit on furniture or bedding.   If you are allergic to a pet: ? Keep the pet out of your bedroom. ? Vacuum, sweep, and dust often.   General instructions  Take over-the-counter and prescription medicines only as told by your doctor.  Drink enough fluid to keep your pee (urine) pale yellow.  Keep all follow-up visits as told by your doctor. This is important. Where to find more information  American Academy of Allergy, Asthma & Immunology: www.aaaai.org Contact a doctor if:  You have a fever.  You get a cough that does not go away.  You make whistling sounds when you breathe (wheeze).  Your symptoms slow you down.  Your symptoms stop you from doing your normal things each day. Get help right away if:  You are short of breath. This symptom may be an emergency. Do not wait to see if the symptom will go away. Get medical help right away. Call your local emergency services (911 in the [place] Do not drive yourself to the hospital. Summary  Allergic rhinitis may be treated by taking medicines and avoiding things you are allergic to.  If you have allergies only some of the year, keep windows closed when you can at those times.  Contact your doctor if you get a fever or a cough that does not go away. This information is not intended to replace advice given to you by your health care provider. Make sure you discuss  any questions you have with your health care provider. Document Revised: 06/24/2019 Document Reviewed: 04/30/2019 Elsevier Patient Education  2021 [place].  Preventing Unhealthy [employer], Adult Staying at a healthy weight is important to your overall health. When fat builds up in your body, you may become overweight or obese. Being overweight or obese increases your risk of developing certain health problems, such as heart disease, diabetes, sleeping problems, joint problems, and some types of cancer. Unhealthy weight gain is often the result of making unhealthy food choices or not getting enough exercise. You can make changes to your lifestyle to prevent obesity and stay as healthy as possible. What nutrition changes can be made?  Eat only as much as your body needs. To do this: ? Pay attention to signs that you are hungry or full. Stop eating as soon as you feel full. ? If you feel hungry, try drinking water first before eating. Drink enough water so your urine is clear or pale yellow. ? Eat smaller portions. Pay attention to portion sizes when eating out. ? Look at serving sizes on food labels. Most foods contain more than one serving per container. ? Eat the recommended number of calories for your gender and activity level. For most active people, a daily total of 2,000 calories is appropriate. If you are trying to lose weight or are not very active, you may need to eat fewer calories. Talk with your health care provider or a diet and nutrition specialist (dietitian) about how many calories you need each day.  Choose healthy foods, such as: ? Fruits and vegetables. At each meal, try to fill at least half of your plate with fruits and vegetables. ? Whole grains, such as whole-wheat bread, brown rice, and quinoa. ? Lean meats, such as chicken or fish. ? Other healthy proteins, such as beans, eggs, or tofu. ? Healthy fats, such as nuts, seeds, fatty fish, and olive oil. ? Low-fat or fat-free dairy products.  Check food labels, and avoid food and drinks that: ? Are high in calories. ? Have added sugar. ? Are high in sodium. ? Have  saturated fats or trans fats.  Cook foods in healthier ways, such as by baking, broiling, or grilling.  Make a meal plan for the week, and shop with a grocery list to help you stay on track with your purchases. Try to avoid going to the grocery store when you are hungry.  When grocery shopping, try to shop around the outside of the store first, where the fresh foods are. Doing this helps you to avoid prepackaged foods, which can be high in sugar, salt (sodium), and fat.   What lifestyle changes can be made?  Exercise for 30 or more minutes on 5 or more days each week. Exercising may include brisk walking, yard work, biking, running, swimming, and team sports like basketball and soccer. Ask your health care provider which exercises are safe for you.  Do muscle-strengthening activities, such as lifting weights or using resistance bands, on 2 or more days a week.  Do not use any products that contain nicotine or tobacco, such as cigarettes and e-cigarettes. If you need help quitting, ask your health care provider.  Limit alcohol intake to no more than 1 drink a day for nonpregnant women and 2 drinks a day for men. One drink equals 12 oz of beer, 5 oz of wine, or 1 oz of hard liquor.  Try to get 7-9 hours of sleep each night.  What other changes can be made?  Keep a food and activity journal to keep track of: ? What you ate and how many calories you had. Remember to count the calories in sauces, dressings, and side dishes. ? Whether you were active, and what exercises you did. ? Your calorie, weight, and activity goals.  Check your weight regularly. Track any changes. If you notice you have gained weight, make changes to your diet or activity routine.  Avoid taking weight-loss medicines or supplements. Talk to your health care provider before starting any new medicine or supplement.  Talk to your health care provider before trying any new diet or exercise plan. Why are these changes  important? Eating healthy, staying active, and having healthy habits can help you to prevent obesity. Those changes also:  Help you manage stress and emotions.  Help you connect with friends and family.  Improve your self-esteem.  Improve your sleep.  Prevent long-term health problems. What can happen if changes are not made? Being obese or overweight can cause you to develop joint or bone problems, which can make it hard for you to stay active or do activities you enjoy. Being obese or overweight also puts stress on your heart and lungs and can lead to health problems like diabetes, heart disease, and some cancers. Where to find more information Talk with your health care provider or a dietitian about healthy eating and healthy lifestyle choices. You may also find information from:  [place] Department of Agriculture, MyPlate: FormerBoss.no  American Heart Association: www.heart.org  Centers for Disease Control and Prevention: http://www.wolf.info/ Summary  Staying at a healthy weight is important to your overall health. It helps you to prevent certain diseases and health problems, such as heart disease, diabetes, joint problems, sleep disorders, and some types of cancer.  Being obese or overweight can cause you to develop joint or bone problems, which can make it hard for you to stay active or do activities you enjoy.  You can prevent unhealthy weight gain by eating a healthy diet, exercising regularly, not smoking, limiting alcohol, and getting enough sleep.  Talk with your health care provider or a dietitian for guidance about healthy eating and healthy lifestyle choices. This information is not intended to replace advice given to you by your health care provider. Make sure you discuss any questions you have with your health care provider. Document Revised: 08/29/2019 Document Reviewed: 08/29/2019 Elsevier Patient Education  2021 [employer].

## 2020-09-11 LAB — HEPATITIS C ANTIBODY
Hepatitis C Ab: NONREACTIVE
SIGNAL TO CUT-OFF: 0.01 (ref <1.00)

## 2020-09-18 ENCOUNTER — Other Ambulatory Visit: Payer: Self-pay | Admitting: Family Medicine

## 2020-09-18 DIAGNOSIS — E559 Vitamin D deficiency, unspecified: Secondary | ICD-10-CM

## 2020-09-18 MED ORDER — VITAMIN D (ERGOCALCIFEROL) 1.25 MG (50000 UNIT) PO CAPS: 50000.0000 [IU] | ORAL_CAPSULE | ORAL | 0 refills | Status: DC

## 2020-09-21 NOTE — Progress Notes (Signed)
Spoke with patient, is aware. 

## 2020-11-22 ENCOUNTER — Other Ambulatory Visit: Payer: Self-pay

## 2020-11-22 ENCOUNTER — Emergency Department (HOSPITAL_COMMUNITY)
Admission: EM | Admit: 2020-11-22 | Discharge: 2020-11-22 | Disposition: A | Discharge status: Home / Self Care | Payer: 59 | Attending: Emergency Medicine | Admitting: Emergency Medicine

## 2020-11-22 DIAGNOSIS — Z87891 Personal history of nicotine dependence: Secondary | ICD-10-CM | POA: Diagnosis not present

## 2020-11-22 DIAGNOSIS — M545 Low back pain, unspecified: Secondary | ICD-10-CM

## 2020-11-22 LAB — URINALYSIS, ROUTINE W REFLEX MICROSCOPIC
Bilirubin Urine: NEGATIVE
Glucose, UA: NEGATIVE mg/dL
Hgb urine dipstick: NEGATIVE
Ketones, ur: 5 mg/dL — AB
Leukocytes,Ua: NEGATIVE
Nitrite: NEGATIVE
Protein, ur: NEGATIVE mg/dL
Specific Gravity, Urine: 1.009 (ref 1.005–1.030)
pH: 6 (ref 5.0–8.0)

## 2020-11-22 MED ORDER — DEXAMETHASONE SODIUM PHOSPHATE 10 MG/ML IJ SOLN
8.0000 mg | Freq: Once | INTRAMUSCULAR | Status: AC
  Administered 2020-11-22: 8 mg via INTRAMUSCULAR
  Filled 2020-11-22: qty 1

## 2020-11-22 MED ORDER — KETOROLAC TROMETHAMINE 60 MG/2ML IM SOLN
60.0000 mg | Freq: Once | INTRAMUSCULAR | Status: AC
  Administered 2020-11-22: 60 mg via INTRAMUSCULAR
  Filled 2020-11-22: qty 2

## 2020-11-22 MED ORDER — METHOCARBAMOL 500 MG PO TABS: 500.0000 mg | ORAL_TABLET | Freq: Two times a day (BID) | ORAL | 0 refills | Status: DC

## 2020-11-22 NOTE — ED Provider Notes (Signed)
[facility]-EMERGENCY DEPT Provider Note   CSN: [id number] Arrival date & time: 11/22/20  1321     History No chief complaint on file.   Lauren Fox is a 55 y.o. female past medical history of headache, hyperlipidemia, lumbar disc disease, morbid obesity presents for evaluation of right-sided back pain that has been ongoing x1 week.  She reports initially about a week ago, she was doing some cleaning around the house.  She woke up the next day and had some pain in her right back.  She states it felt like a spasming or not twisting on her right back.  She states it was worse with movement, bending.  She went to urgent care on 11/16/2020 and was told it was a muscle strain.  She reports that she was prescribed an anti-inflammatory as well as a muscle relaxer.  She has been taking it and does report some improvement but states that pain has persisted.  She reports that today, she did not take any of the medications about 12 PM and states that she still felt like it was hurting so she came to the ED.  No new trauma, injury, fall.  She states that sometimes will radiate to the side but otherwise does not go anywhere.  She has not had any associated fevers, nausea/vomiting, dysuria, hematuria, abdominal pain, nausea/vomiting, chest pain, difficulty breathing. No rash noted. Denies fevers, weight loss, numbness/weakness of upper and lower extremities, bowel/bladder incontinence, saddle anesthesia, history of back surgery, history of IVDA.   The history is provided by the patient.      Past Medical History:  Diagnosis Date   Acute low back pain 06/10/2013   ALLERGIC RHINITIS 11/25/2008   Arthritis    DIZZINESS 11/25/2008   FOOT PAIN, LEFT 12/19/2009   GLUCOSE INTOLERANCE 11/25/2008   Gout, unspecified 11/25/2008   Headache(784.0) 06/22/2010   HYPERLIPIDEMIA 11/25/2008   Impaired glucose tolerance 08/24/2010   Lumbar disc disease 02/21/2012   Morbid obesity (HCC) 11/25/2008    Unspecified backache 12/17/2009    Patient Active Problem List   Diagnosis Date Noted   Menopausal flushing 01/18/2018   Night sweats 01/18/2018   De Quervain's syndrome (tenosynovitis) 09/04/2017   Left otitis media 04/12/2017   Vertigo 10/25/2014   Pain and swelling of ankle 02/23/2014   Hyperprolactinemia (HCC) 02/21/2012   Lumbar disc disease 02/21/2012   Heart palpitations 02/21/2012   Vitamin D deficiency 02/23/2011   Impaired glucose tolerance 08/24/2010   Preventative health care 08/24/2010   HYPERLIPIDEMIA 11/25/2008   Gout, unspecified 11/25/2008   Morbid obesity (HCC) 11/25/2008   Allergic rhinitis 11/25/2008    Past Surgical History:  Procedure Laterality Date   ABDOMINAL HYSTERECTOMY  20063   non malignant   s/p pilonidal cyst     TUBAL LIGATION       OB History   No obstetric history on file.     Family History  Problem Relation Age of Onset   Hyperlipidemia Mother    Hypertension Mother    Glaucoma Father    Hypertension Father    Diabetes Father    Diabetes Other    Heart attack Other    Coronary artery disease Other    Diabetes Other    Colon cancer Neg Hx    Esophageal cancer Neg Hx    Rectal cancer Neg Hx    Stomach cancer Neg Hx     Social History   Tobacco Use   Smoking status: Former  Pack years: 0.00    Types: Cigarettes    Quit date: 05/16/2012    Years since quitting: 8.5   Smokeless tobacco: Never   Tobacco comments:    pt verbalized never smoking  Substance Use Topics   Alcohol use: Yes    Alcohol/week: 0.0 standard drinks    Comment: social   Drug use: No    Types: Benzodiazepines    Home Medications Prior to Admission medications   Medication Sig Start Date End Date Taking? Authorizing Provider  methocarbamol (ROBAXIN) 500 MG tablet Take 1 tablet (500 mg total) by mouth 2 (two) times daily. 11/22/20  Yes Maxwell Caul, PA-C  fluticasone (FLONASE) 50 MCG/ACT nasal spray Place 2 sprays into both nostrils daily.  01/19/20   Junie Spencer, FNP  meloxicam (MOBIC) 15 MG tablet Take 1 tablet (15 mg total) by mouth daily. Patient not taking: Reported on 09/10/2020 06/11/19   Ernestene Kiel T, DPM  naproxen (NAPROSYN) 500 MG tablet Take 1 tablet (500 mg total) by mouth 2 (two) times daily. Patient not taking: Reported on 09/10/2020 06/07/19   Petrucelli, Lelon Mast R, PA-C  Vitamin D, Ergocalciferol, (DRISDOL) 1.25 MG (50000 UNIT) CAPS capsule Take 1 capsule (50,000 Units total) by mouth every 7 (seven) days. 09/18/20   Deeann Saint, MD    Allergies    Patient has no known allergies.  Review of Systems   Review of Systems  Constitutional:  Negative for fever.  Respiratory:  Negative for cough and shortness of breath.   Cardiovascular:  Negative for chest pain.  Gastrointestinal:  Negative for abdominal pain, nausea and vomiting.  Genitourinary:  Negative for dysuria and hematuria.  Musculoskeletal:  Positive for back pain.  Neurological:  Negative for weakness, numbness and headaches.  All other systems reviewed and are negative.  Physical Exam Updated Vital Signs BP 136/84 (BP Location: Left Arm)   Pulse (!) 55   Temp 98.2 F (36.8 C) (Oral)   Resp 18   Ht 5' 4.5" (1.638 m)   Wt 106.6 kg   SpO2 100%   BMI 39.71 kg/m   Physical Exam Vitals and nursing note reviewed.  Constitutional:      Appearance: She is well-developed.  HENT:     Head: Normocephalic and atraumatic.  Eyes:     General: No scleral icterus.       Right eye: No discharge.        Left eye: No discharge.     Conjunctiva/sclera: Conjunctivae normal.  Neck:     Comments: Full flexion/extension and lateral movement of neck fully intact. No bony midline tenderness. No deformities or crepitus.  Pulmonary:     Effort: Pulmonary effort is normal.  Abdominal:     Comments: Abdomen is soft, non-distended, non-tender. No rigidity, No guarding. No peritoneal signs.  Musculoskeletal:     Comments: Tenderness palpation noted to the  paraspinal muscles of the right lumbar region.  No overlying warmth, erythema, edema.  No rash noted.  No midline T or L-spine tenderness.  Skin:    General: Skin is warm and dry.  Neurological:     Mental Status: She is alert.     Comments: Follows commands, Moves all extremities  5/5 strength to BUE and BLE  Sensation intact throughout all major nerve distributions Normal gait  Psychiatric:        Speech: Speech normal.        Behavior: Behavior normal.    ED Results / Procedures / Treatments  Labs (all labs ordered are listed, but only abnormal results are displayed) Labs Reviewed  URINALYSIS, ROUTINE W REFLEX MICROSCOPIC - Abnormal; Notable for the following components:      Result Value   Color, Urine STRAW (*)    Ketones, ur 5 (*)    All other components within normal limits    EKG None  Radiology No results found.  Procedures Procedures   Medications Ordered in ED Medications  ketorolac (TORADOL) injection 60 mg (60 mg Intramuscular Given 11/22/20 1629)  dexamethasone (DECADRON) injection 8 mg (8 mg Intramuscular Given 11/22/20 1630)    ED Course  I have reviewed the triage vital signs and the nursing notes.  Pertinent labs & imaging results that were available during my care of the patient were reviewed by me and considered in my medical decision making (see chart for details).    MDM Rules/Calculators/A&P                          55 year old female who presents for evaluation of right-sided back pain x1 week.  Seen at urgent care initially and was told that this was a muscle strain. She was prescribed muscle relaxer and anti-inflammatory but still has pain. No fevers, numbness/weakness. No saddle anesthesia, no urinary or bowel incontinence. Patient is afebrile, non-toxic appearing, sitting comfortably on examination table. Vital signs reviewed and stable.  Patient with tenderness noted to the right paraspinal muscles. History/exam seem consistent with MSK  etiology. She has no red flag symptoms or neuro deficits.  She has no rash that would be concerning for shingles.  She has no abdominal tenderness.  She would like to make sure she does have a urinary tract infection given that this is been ongoing for about a week or so.  She is slightly hypertensive here in the ED but is otherwise well-appearing.  Do not suspect dissection.  We will plan to check UA.  Given that she has no midline tenderness.  Do not feel that she needs imaging at this time.  UA negative for any infectious etiology.  Discussed results with patient.  We discussed continued use of supportive care measures as well as medications.  She is on Flexeril as well as diclofenac.  We discussed potentially alternating her muscle relaxers and instead starting her on a Robaxin.  At this time, patient is ambulatory with no concerning signs or symptoms.  We will plan to discharge her home. At this time, patient exhibits no emergent life-threatening condition that require further evaluation in ED. Patient had ample opportunity for questions and discussion. All patient's questions were answered with full understanding. Strict return precautions discussed. Patient expresses understanding and agreement to plan.   Portions of this note were generated with Scientist, clinical (histocompatibility and immunogenetics). Dictation errors may occur despite best attempts at proofreading.   Final Clinical Impression(s) / ED Diagnoses Final diagnoses:  Acute right-sided low back pain, unspecified whether sciatica present    Rx / DC Orders ED Discharge Orders          Ordered    methocarbamol (ROBAXIN) 500 MG tablet  2 times daily        11/22/20 1802             Rosana Hoes 11/22/20 2344    Tegeler, Canary Brim, MD 11/23/20 0011

## 2020-11-22 NOTE — ED Triage Notes (Signed)
Patient states she has right lower back pain that moves to her abdomen, pain gets worse with movement. Was dx w/ a lower back strain at the UC last week, has been taking muscle relaxers as prescribed but it is just getting worse.

## 2020-11-22 NOTE — Discharge Instructions (Addendum)
Urine today looked reassuring.  As we discussed, we will try switching her muscle relaxer.  If you take the Robaxin, do not take the Flexeril previously prescribed.  Take Robaxin as prescribed. This medication will make you drowsy so do not drive or drink alcohol when taking it.  You can take Tylenol or Ibuprofen as directed for pain. You can alternate Tylenol and Ibuprofen every 4 hours. If you take Tylenol at 1pm, then you can take Ibuprofen at 5pm. Then you can take Tylenol again at 9pm.   Do not take ibuprofen diclofenac at the same time.  Return to the Emergency Department immediately for any worsening back pain, neck pain, difficulty walking, numbness/weaknss of your arms or legs, urinary or bowel accidents, fever or any other worsening or concerning symptoms.

## 2021-01-20 IMAGING — CR DG FOOT COMPLETE 3+V*L*
3 series · 3 of 3 positions shown · non-contrast
Comparison: None.

CLINICAL DATA: Pain

EXAM:
LEFT FOOT - COMPLETE 3+ VIEW

[x foot ap left]
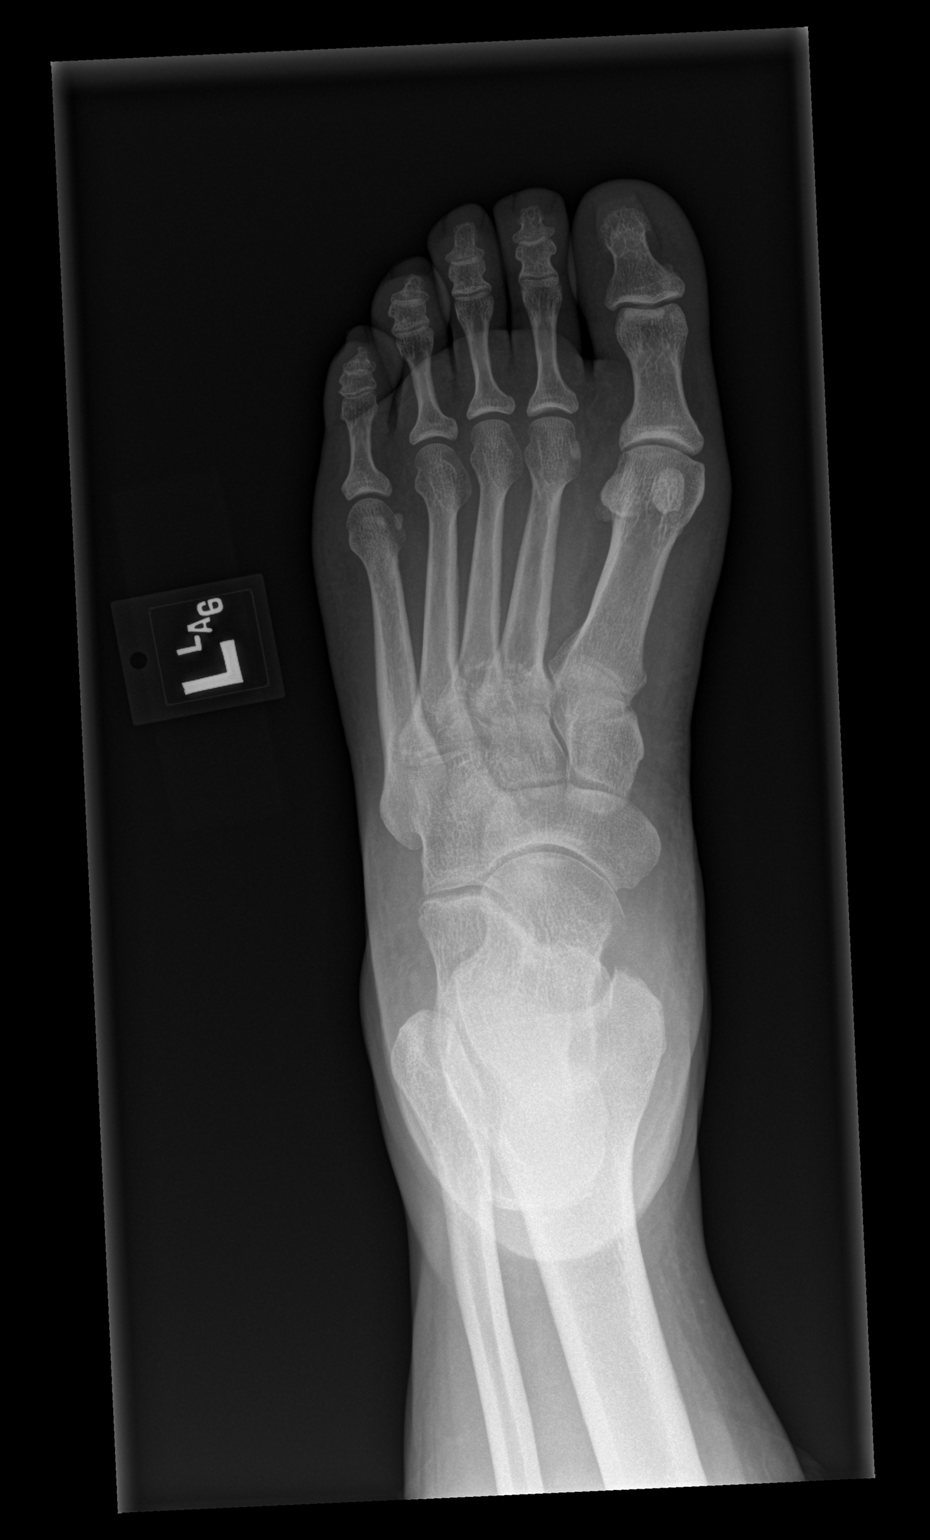

[x foot obl left]
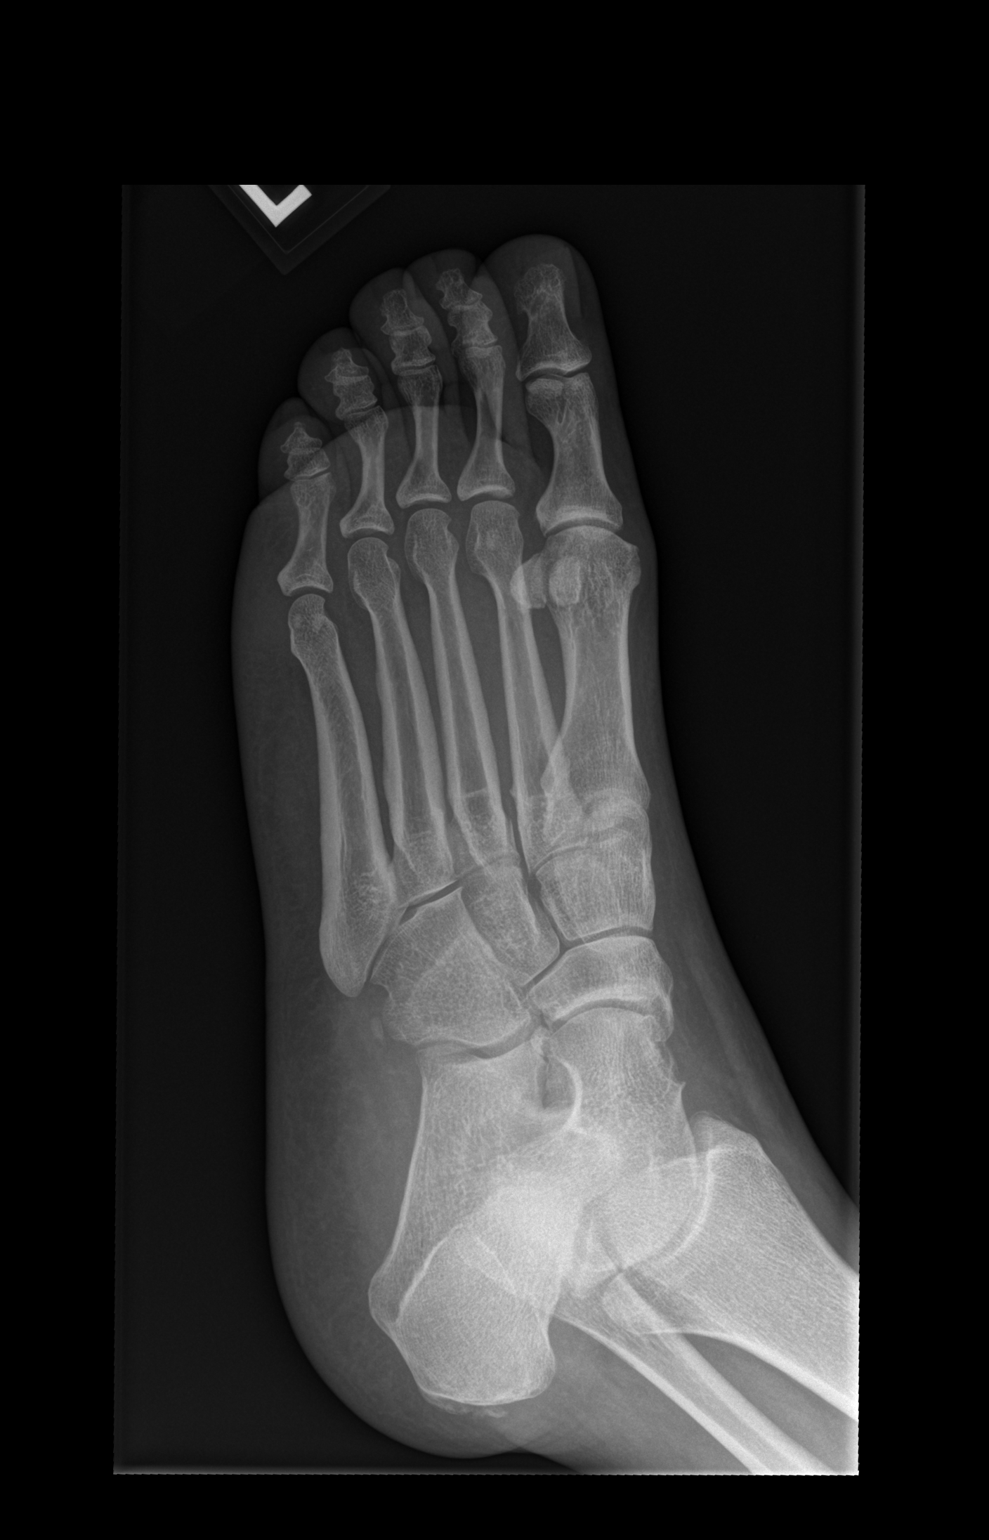

[x foot lat left]
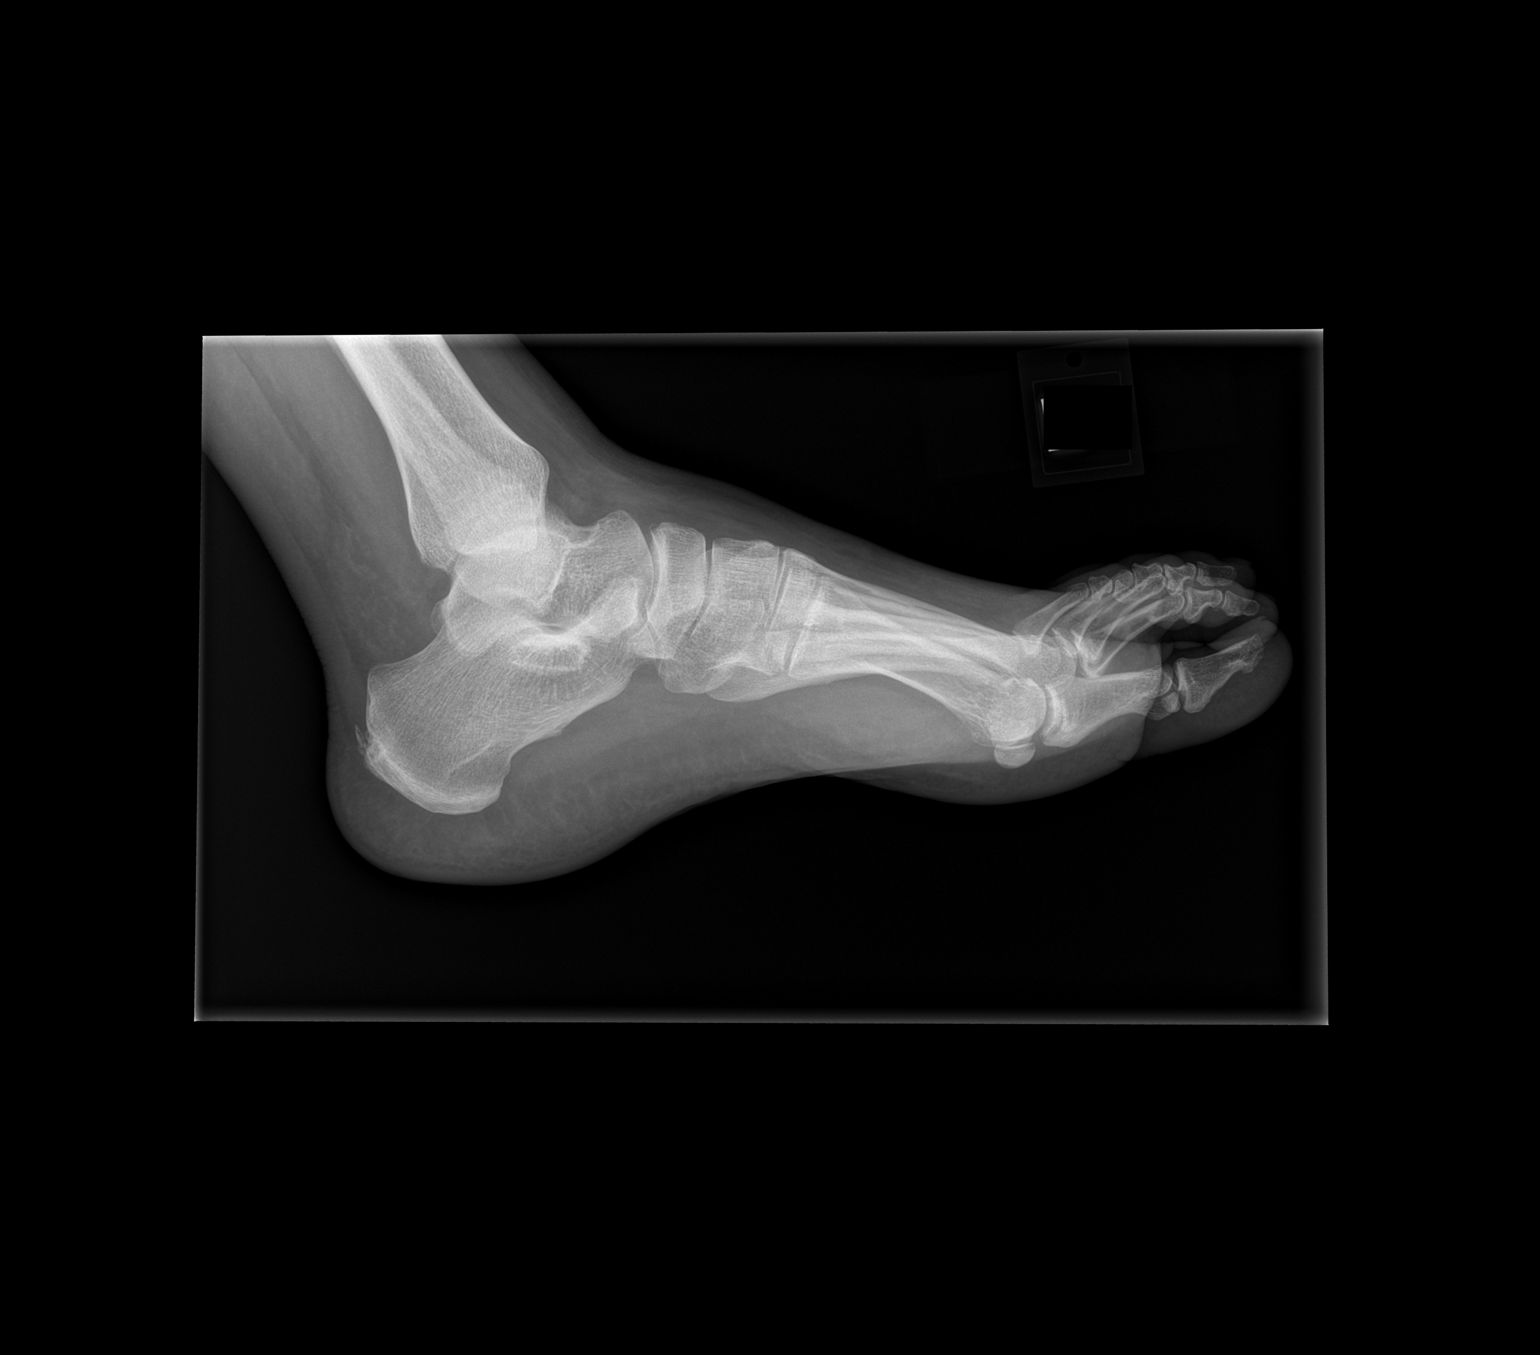

[3 of 3 positions shown; findings below may reference images not displayed]

FINDINGS: There is no evidence of fracture or dislocation. There is no
evidence of arthropathy or other focal bone abnormality. Soft
tissues are unremarkable.
IMPRESSION: Negative.

## 2021-03-29 ENCOUNTER — Encounter: Payer: Self-pay | Admitting: Family Medicine

## 2021-05-26 ENCOUNTER — Other Ambulatory Visit: Payer: Self-pay

## 2021-05-26 ENCOUNTER — Encounter: Payer: Self-pay | Admitting: Podiatry

## 2021-05-26 ENCOUNTER — Ambulatory Visit (INDEPENDENT_AMBULATORY_CARE_PROVIDER_SITE_OTHER): Payer: 59

## 2021-05-26 ENCOUNTER — Ambulatory Visit (INDEPENDENT_AMBULATORY_CARE_PROVIDER_SITE_OTHER): Payer: 59 | Admitting: Podiatry

## 2021-05-26 DIAGNOSIS — M722 Plantar fascial fibromatosis: Secondary | ICD-10-CM

## 2021-05-26 DIAGNOSIS — S92345A Nondisplaced fracture of fourth metatarsal bone, left foot, initial encounter for closed fracture: Secondary | ICD-10-CM

## 2021-05-26 MED ORDER — METHYLPREDNISOLONE 4 MG PO TBPK: ORAL_TABLET | ORAL | 0 refills | Status: DC

## 2021-05-26 NOTE — Progress Notes (Signed)
She presents today with pain to the dorsal lateral aspect of her left foot she states is been aching and swelling for the past few days she states that she has had pain similar to this before but never this severe and has never stayed around this long.  She says she cannot bear full weight on it the foot is swollen she tried ice and heat and muscle relaxers nothing seems to help.  Objective: Vital signs are stable alert oriented x3 pulses are palpable.  Edematous foot left.  Radiographs taken today demonstrate what appears to be a nondisplaced fracture that appears to be healing with a bony callus particularly visible on lateral view of the base of the fourth metatarsal nondisplaced not comminuted fractures identifiable.  Assessment fracture fourth met base.  Plan: Placed her in a compression anklet and a cam boot I will follow-up with her in 4 weeks for another set of x-rays.  If she has not improved a CT or an MRI will be necessary.

## 2021-06-23 ENCOUNTER — Ambulatory Visit: Payer: 59 | Admitting: Podiatry

## 2021-06-25 NOTE — Telephone Encounter (Signed)
error 

## 2021-07-07 ENCOUNTER — Ambulatory Visit: Payer: 59 | Admitting: Podiatry

## 2021-07-12 ENCOUNTER — Ambulatory Visit (INDEPENDENT_AMBULATORY_CARE_PROVIDER_SITE_OTHER): Payer: 59 | Admitting: Podiatry

## 2021-07-12 ENCOUNTER — Other Ambulatory Visit: Payer: Self-pay

## 2021-07-12 ENCOUNTER — Other Ambulatory Visit: Payer: Self-pay | Admitting: Podiatry

## 2021-07-12 ENCOUNTER — Encounter: Payer: Self-pay | Admitting: Podiatry

## 2021-07-12 ENCOUNTER — Ambulatory Visit (INDEPENDENT_AMBULATORY_CARE_PROVIDER_SITE_OTHER): Payer: 59

## 2021-07-12 DIAGNOSIS — M84374A Stress fracture, right foot, initial encounter for fracture: Secondary | ICD-10-CM

## 2021-07-12 DIAGNOSIS — S92345A Nondisplaced fracture of fourth metatarsal bone, left foot, initial encounter for closed fracture: Secondary | ICD-10-CM | POA: Diagnosis not present

## 2021-07-12 DIAGNOSIS — S92345D Nondisplaced fracture of fourth metatarsal bone, left foot, subsequent encounter for fracture with routine healing: Secondary | ICD-10-CM

## 2021-07-12 DIAGNOSIS — M778 Other enthesopathies, not elsewhere classified: Secondary | ICD-10-CM

## 2021-07-12 NOTE — Progress Notes (Signed)
She presents today for follow-up of a stress fracture fourth metatarsal base of the left foot states that this feels so much better however over the past week and developed swelling and pain in the lesser metatarsals of the right foot.  Objective: Vital signs stable she is alert oriented x3 no erythema edema cellulitis drainage or odor left foot.  No reproducible pain left foot.  Radiographs confirm well-healed fourth metatarsal base left foot.  Forefoot right does demonstrate moderate edema with mild erythema tenderness and particularly over the second and third metatarsal necks.  Radiographs confirm no stress fracture as of yet.  No periostitis.  Assessment: Well-healed fourth met base fracture left and then possible stress fracture right.  Plan: I requested that she follow-up with me in about 3 weeks for another set of x-rays for the forefoot metatarsals right.  And then she is to wear her Darco shoe.

## 2021-08-04 ENCOUNTER — Other Ambulatory Visit: Payer: Self-pay

## 2021-08-04 ENCOUNTER — Ambulatory Visit (INDEPENDENT_AMBULATORY_CARE_PROVIDER_SITE_OTHER): Payer: 59 | Admitting: Podiatry

## 2021-08-04 ENCOUNTER — Encounter: Payer: Self-pay | Admitting: Podiatry

## 2021-08-04 ENCOUNTER — Ambulatory Visit (INDEPENDENT_AMBULATORY_CARE_PROVIDER_SITE_OTHER): Payer: 59

## 2021-08-04 DIAGNOSIS — S92345D Nondisplaced fracture of fourth metatarsal bone, left foot, subsequent encounter for fracture with routine healing: Secondary | ICD-10-CM | POA: Diagnosis not present

## 2021-08-04 NOTE — Progress Notes (Signed)
She presents today for follow-up of fracture base of her third fourth metatarsal of her left foot.  States that is finally better to start wearing shoes over the last couple days. ? ?Objective: Vital signs stable alert oriented x3 much decrease in edema no erythema cellulitis drainage or odor postinflammatory hyperpigmentation is still present overlying midfoot.  She has some tenderness on frontal plane range of motion around the fourth fifth met base.  She also has tenderness on palpation of the third metatarsal base. ? ?Radiographs taken today demonstrate well-healed proximal fracture metaphyseal region of the third metatarsal. ? ?Assessment: Well-healing fracture. ? ?Plan: Follow-up with me as needed. ?

## 2021-09-10 ENCOUNTER — Ambulatory Visit (INDEPENDENT_AMBULATORY_CARE_PROVIDER_SITE_OTHER): Payer: 59 | Admitting: Family Medicine

## 2021-09-10 VITALS — BP 140/80 | HR 54 | Temp 98.2°F | Ht 64.0 in | Wt 235.8 lb

## 2021-09-10 DIAGNOSIS — Z0000 Encounter for general adult medical examination without abnormal findings: Secondary | ICD-10-CM | POA: Diagnosis not present

## 2021-09-10 DIAGNOSIS — E559 Vitamin D deficiency, unspecified: Secondary | ICD-10-CM

## 2021-09-10 DIAGNOSIS — M5442 Lumbago with sciatica, left side: Secondary | ICD-10-CM | POA: Diagnosis not present

## 2021-09-10 DIAGNOSIS — Z6841 Body Mass Index (BMI) 40.0 and over, adult: Secondary | ICD-10-CM | POA: Diagnosis not present

## 2021-09-10 DIAGNOSIS — Z23 Encounter for immunization: Secondary | ICD-10-CM

## 2021-09-10 DIAGNOSIS — M5127 Other intervertebral disc displacement, lumbosacral region: Secondary | ICD-10-CM | POA: Diagnosis not present

## 2021-09-10 DIAGNOSIS — E782 Mixed hyperlipidemia: Secondary | ICD-10-CM | POA: Diagnosis not present

## 2021-09-10 DIAGNOSIS — G8929 Other chronic pain: Secondary | ICD-10-CM

## 2021-09-10 LAB — COMPREHENSIVE METABOLIC PANEL
ALT: 40 U/L — ABNORMAL HIGH (ref 0–35)
AST: 32 U/L (ref 0–37)
Albumin: 5.3 g/dL — ABNORMAL HIGH (ref 3.5–5.2)
Alkaline Phosphatase: 70 U/L (ref 39–117)
BUN: 12 mg/dL (ref 6–23)
CO2: 26 mEq/L (ref 19–32)
Calcium: 10.3 mg/dL (ref 8.4–10.5)
Chloride: 101 mEq/L (ref 96–112)
Creatinine, Ser: 0.94 mg/dL (ref 0.40–1.20)
GFR: 69.34 mL/min (ref >60.00)
Glucose, Bld: 71 mg/dL (ref 70–99)
Potassium: 3.8 mEq/L (ref 3.5–5.1)
Sodium: 141 mEq/L (ref 135–145)
Total Bilirubin: 0.5 mg/dL (ref 0.2–1.2)
Total Protein: 8.6 g/dL — ABNORMAL HIGH (ref 6.0–8.3)

## 2021-09-10 LAB — CBC WITH DIFFERENTIAL/PLATELET
Basophils Absolute: 0.1 10*3/uL (ref 0.0–0.1)
Basophils Relative: 0.9 % (ref 0.0–3.0)
Eosinophils Absolute: 0.5 10*3/uL (ref 0.0–0.7)
Eosinophils Relative: 6.6 % — ABNORMAL HIGH (ref 0.0–5.0)
HCT: 47.6 % — ABNORMAL HIGH (ref 36.0–46.0)
Hemoglobin: 15.7 g/dL — ABNORMAL HIGH (ref 12.0–15.0)
Lymphocytes Relative: 48.2 % — ABNORMAL HIGH (ref 12.0–46.0)
Lymphs Abs: 3.5 10*3/uL (ref 0.7–4.0)
MCHC: 33.1 g/dL (ref 30.0–36.0)
MCV: 95.2 fl (ref 78.0–100.0)
Monocytes Absolute: 0.5 10*3/uL (ref 0.1–1.0)
Monocytes Relative: 6.3 % (ref 3.0–12.0)
Neutro Abs: 2.7 10*3/uL (ref 1.4–7.7)
Neutrophils Relative %: 38 % — ABNORMAL LOW (ref 43.0–77.0)
Platelets: 222 10*3/uL (ref 150.0–400.0)
RBC: 5 Mil/uL (ref 3.87–5.11)
RDW: 13.6 % (ref 11.5–15.5)
WBC: 7.2 10*3/uL (ref 4.0–10.5)

## 2021-09-10 LAB — LIPID PANEL
Cholesterol: 264 mg/dL — ABNORMAL HIGH (ref 0–200)
HDL: 65 mg/dL (ref >39.00)
LDL Cholesterol: 177 mg/dL — ABNORMAL HIGH (ref 0–99)
NonHDL: 199.02
Total CHOL/HDL Ratio: 4
Triglycerides: 108 mg/dL (ref 0.0–149.0)
VLDL: 21.6 mg/dL (ref 0.0–40.0)

## 2021-09-10 LAB — HEMOGLOBIN A1C: Hgb A1c MFr Bld: 5.7 % (ref 4.6–6.5)

## 2021-09-10 LAB — T4, FREE: Free T4: 0.59 ng/dL — ABNORMAL LOW (ref 0.60–1.60)

## 2021-09-10 LAB — VITAMIN D 25 HYDROXY (VIT D DEFICIENCY, FRACTURES): VITD: 36.4 ng/mL (ref 30.00–100.00)

## 2021-09-10 LAB — TSH: TSH: 5.42 u[IU]/mL (ref 0.35–5.50)

## 2021-09-10 NOTE — Patient Instructions (Signed)
Do not forget to schedule your mammogram. ? ?You should also look into following up with your back specialist.  If you need a new referral we can place one for you. ?

## 2021-09-10 NOTE — Progress Notes (Signed)
Subjective:  ?  ? Lauren Fox is a 56 y.o. female and is here for a comprehensive physical exam.  Pt endorses chronic left sided low back pain with intermittent L sided sciatica and h/o herniated disc.  Pt states several yrs ago she had to get back injections.  Colonoscopy done 10/15/2018.  Pt has not had a recent mammogram. ? ?Social History  ? ?Socioeconomic History  ? Marital status: Married  ?  Spouse name: Not on file  ? Number of children: 3  ? Years of education: Not on file  ? Highest education level: Not on file  ?Occupational History  ? Occupation: inpt. coordinator [employer]  ?  Employer: [employer]  ?Tobacco Use  ? Smoking status: Former  ?  Types: Cigarettes  ?  Quit date: 05/16/2012  ?  Years since quitting: 9.3  ? Smokeless tobacco: Never  ? Tobacco comments:  ?  pt verbalized never smoking  ?Substance and Sexual Activity  ? Alcohol use: Yes  ?  Alcohol/week: 0.0 standard drinks  ?  Comment: social  ? Drug use: No  ?  Types: Benzodiazepines  ? Sexual activity: Not on file  ?Other Topics Concern  ? Not on file  ?Social History Narrative  ? Not on file  ? ?Social Determinants of Health  ? ?Financial Resource Strain: Not on file  ?Food Insecurity: Not on file  ?Transportation Needs: Not on file  ?Physical Activity: Not on file  ?Stress: Not on file  ?Social Connections: Not on file  ?Intimate Partner Violence: Not on file  ? ?Health Maintenance  ?Topic Date Due  ? TETANUS/TDAP  11/26/2018  ? COVID-19 Vaccine (3 - Booster for Pfizer series) 03/30/2020  ? INFLUENZA VACCINE  12/14/2021  ? MAMMOGRAM  11/28/2022  ? COLONOSCOPY (Pts 45-44yrs Insurance coverage will need to be confirmed)  10/14/2028  ? Hepatitis C Screening  Completed  ? HIV Screening  Completed  ? Zoster Vaccines- Shingrix  Completed  ? HPV VACCINES  Aged Out  ? ? ?The following portions of the patient's history were reviewed and updated as appropriate: allergies, current medications, past family history, past medical  history, past social history, past surgical history, and problem list. ? ?Review of Systems ?Pertinent items noted in HPI and remainder of comprehensive ROS otherwise negative.  ? ?Objective:  ? ? BP (!) 161/82 (BP Location: Left Arm, Patient Position: Sitting, Cuff Size: Normal)   Pulse (!) 54   Temp 98.2 ?F (36.8 ?C) (Oral)   Ht 5\' 4"  (1.626 m)   Wt 235 lb 12.8 oz (107 kg)   SpO2 99%   BMI 40.47 kg/m?  ?General appearance: alert, cooperative, and no distress ?Head: Normocephalic, without obvious abnormality, atraumatic ?Eyes: conjunctivae/corneas clear. PERRL, EOM's intact. Fundi benign. ?Ears: normal TM's and external ear canals both ears ?Nose: Nares normal. Septum midline. Mucosa normal. No drainage or sinus tenderness. ?Throat: lips, mucosa, and tongue normal; teeth and gums normal ?Neck: no adenopathy, no carotid bruit, no JVD, supple, symmetrical, trachea midline, and thyroid not enlarged, symmetric, no tenderness/mass/nodules ?Lungs: clear to auscultation bilaterally ?Heart: regular rate and rhythm, S1, S2 normal, no murmur, click, rub or gallop ?Abdomen: soft, non-tender; bowel sounds normal; no masses,  no organomegaly ?Extremities: extremities normal, atraumatic, no cyanosis or edema  Negative striaght leg raise, log roll, FADIR, FABER b/l Les. ?Back: No TTP of cervical or thoracic spine midline.  TTP of lower lumbar spine and L lower lumbar paraspinal muscles. ?Pulses: 2+ and  symmetric ?Skin: Skin color, texture, turgor normal. No rashes or lesions ?Lymph nodes: Cervical, supraclavicular, and axillary nodes normal. ?Neurologic: Alert and oriented X 3, normal strength and tone. Normal symmetric reflexes. Normal coordination and gait  ?  ?Assessment:  ? ? Healthy female exam.    ?  ?Plan:  ? ? Anticipatory guidance given including wearing seatbelts, smoke detectors in the home, increasing physical activity, increasing p.o. intake of water and vegetables. ?-obtain labs ?-pt to schedule  mammogram ?-colonoscopy done 10/15/2018. ?-immunizations reviewed.  Tdap given this visit. ?-given handout ?-next CPE in 1 yr ?See After Visit Summary for Counseling Recommendations  ? ?Mixed hyperlipidemia  ?-Lifestyle modifications ?- Plan: Lipid panel, CMP ? ?Chronic left-sided low back pain with left-sided sciatica ?-2/2 herniated disc at L5-S1 per MRI lumbar spine from 07/11/2008 ?-Continue supportive care ?-Further recommendations as stated below. ? ?Herniation of intervertebral disc between L5 and S1 ?-Per chart review MRI lumbar spine 07/11/2008 with a left paracentral disc protrusion at L5-S1 with extruded disc material up behind the L5 vertebral body with mass effect on the left L5 nerve root.  Likely responsible for patient's left lower extremity radiculopathy.  Very shallow broad-based left foraminal and extraforaminal disc protrusion at L4-5.  No neural compression. ?-Given age imaging discussed repeat imaging likely needed. ?-Patient advised to consider follow-up with Ortho versus neurosurgery. ?-Also consider PT ?-Continue supportive care with heat, massage, stretching Tylenol or NSAIDs, topical analgesics, water aerobics. ? ?Vitamin D deficiency ?-Vitamin D 15.05 on 09/10/2020 ?-Continue OTC vitamin D supplement ?- Plan: Vitamin D, 25-hydroxy ? ?Class 3 severe obesity due to excess calories with serious comorbidity and body mass index (BMI) of 40.0 to 44.9 in adult [facility])  ?-Body mass index is 40.47 kg/m?. ?-Lifestyle modifications discussed ?-Consider medical management.  Okay to place referral if needed. ?- Plan: TSH, T4, Free, Hemoglobin A1c, CMP, Vitamin D, 25-hydroxy ? ?Need for Tdap vaccination ?- Plan: Tdap vaccine greater than or equal to 7yo IM ? ? ?F/u in 1-2 month ? ?Abbe Amsterdam, MD ?

## 2021-09-14 ENCOUNTER — Other Ambulatory Visit: Payer: Self-pay

## 2021-09-14 DIAGNOSIS — E782 Mixed hyperlipidemia: Secondary | ICD-10-CM

## 2021-09-16 ENCOUNTER — Encounter: Payer: Self-pay | Admitting: Family Medicine

## 2021-09-20 ENCOUNTER — Other Ambulatory Visit (INDEPENDENT_AMBULATORY_CARE_PROVIDER_SITE_OTHER): Payer: 59

## 2021-09-20 DIAGNOSIS — E782 Mixed hyperlipidemia: Secondary | ICD-10-CM | POA: Diagnosis not present

## 2021-09-20 LAB — COMPREHENSIVE METABOLIC PANEL
ALT: 28 U/L (ref 0–35)
AST: 23 U/L (ref 0–37)
Albumin: 4.6 g/dL (ref 3.5–5.2)
Alkaline Phosphatase: 56 U/L (ref 39–117)
BUN: 14 mg/dL (ref 6–23)
CO2: 25 mEq/L (ref 19–32)
Calcium: 9.5 mg/dL (ref 8.4–10.5)
Chloride: 106 mEq/L (ref 96–112)
Creatinine, Ser: 1.06 mg/dL (ref 0.40–1.20)
GFR: 60.02 mL/min (ref >60.00)
Glucose, Bld: 100 mg/dL — ABNORMAL HIGH (ref 70–99)
Potassium: 4.2 mEq/L (ref 3.5–5.1)
Sodium: 141 mEq/L (ref 135–145)
Total Bilirubin: 0.4 mg/dL (ref 0.2–1.2)
Total Protein: 7.6 g/dL (ref 6.0–8.3)

## 2021-09-22 LAB — PROTEIN ELECTROPHORESIS, SERUM
Albumin ELP: 4.3 g/dL (ref 3.8–4.8)
Alpha 1: 0.3 g/dL (ref 0.2–0.3)
Alpha 2: 0.9 g/dL (ref 0.5–0.9)
Beta 2: 0.4 g/dL (ref 0.2–0.5)
Beta Globulin: 0.4 g/dL (ref 0.4–0.6)
Gamma Globulin: 0.8 g/dL (ref 0.8–1.7)
Total Protein: 7 g/dL (ref 6.1–8.1)

## 2021-11-10 ENCOUNTER — Ambulatory Visit: Payer: 59 | Admitting: Family Medicine

## 2021-11-18 ENCOUNTER — Ambulatory Visit: Payer: 59 | Admitting: Family Medicine

## 2021-11-24 ENCOUNTER — Encounter: Payer: Self-pay | Admitting: Family Medicine

## 2021-11-24 ENCOUNTER — Ambulatory Visit (INDEPENDENT_AMBULATORY_CARE_PROVIDER_SITE_OTHER): Payer: 59 | Admitting: Family Medicine

## 2021-11-24 VITALS — BP 110/70 | HR 77 | Temp 98.4°F | Wt 235.4 lb

## 2021-11-24 DIAGNOSIS — E782 Mixed hyperlipidemia: Secondary | ICD-10-CM | POA: Diagnosis not present

## 2021-11-24 DIAGNOSIS — G8929 Other chronic pain: Secondary | ICD-10-CM

## 2021-11-24 DIAGNOSIS — M5442 Lumbago with sciatica, left side: Secondary | ICD-10-CM | POA: Diagnosis not present

## 2021-11-24 MED ORDER — CYCLOBENZAPRINE HCL 5 MG PO TABS: 5.0000 mg | ORAL_TABLET | Freq: Every evening | ORAL | 1 refills | Status: DC | PRN

## 2021-11-24 NOTE — Progress Notes (Signed)
Subjective:    Patient ID: Lauren Fox, female    DOB: August 01, 1965, 56 y.o.   MRN: [id number]  Chief Complaint  Patient presents with   Follow-up    HPI Patient was seen today for follow-up.  Patient states since last visit she has been doing okay.  Pt's mom is currently in hospice.  Pt states she is doing better since making the decision.  At last visit, bp was 161/82 and cholesterol was elevated.  Pt denies HAs, CP, changes in vision, LE edema.  Patient endorses continued low back pain with left-sided sciatica.  States if sits too long feels stiff and has difficulty getting up/moving.  In the past muscle relaxer helps but only able to take at night due to drowsiness.  Past Medical History:  Diagnosis Date   Acute low back pain 06/10/2013   ALLERGIC RHINITIS 11/25/2008   Arthritis    DIZZINESS 11/25/2008   FOOT PAIN, LEFT 12/19/2009   GLUCOSE INTOLERANCE 11/25/2008   Gout, unspecified 11/25/2008   Headache(784.0) 06/22/2010   HYPERLIPIDEMIA 11/25/2008   Impaired glucose tolerance 08/24/2010   Lumbar disc disease 02/21/2012   Morbid obesity (HCC) 11/25/2008   Unspecified backache 12/17/2009    No Known Allergies  ROS General: Denies fever, chills, night sweats, changes in weight, changes in appetite HEENT: Denies headaches, ear pain, changes in vision, rhinorrhea, sore throat CV: Denies CP, palpitations, SOB, orthopnea Pulm: Denies SOB, cough, wheezing GI: Denies abdominal pain, nausea, vomiting, diarrhea, constipation GU: Denies dysuria, hematuria, frequency, vaginal discharge Msk: Denies muscle cramps, joint pains  + low back pain with left-sided sciatica Neuro: Denies weakness, numbness, tingling Skin: Denies rashes, bruising Psych: Denies depression, anxiety, hallucinations     Objective:    Blood pressure 110/70, pulse 77, temperature 98.4 F (36.9 C), temperature source Oral, weight 235 lb 6.4 oz (106.8 kg), SpO2 97 %.  Gen. Pleasant, well-nourished, in no distress, normal  affect   HEENT: Pearson/AT, face symmetric, conjunctiva clear, no scleral icterus, PERRLA, EOMI, nares patent without drainage Lungs: no accessory muscle use, CTAB, no wheezes or rales Cardiovascular: RRR, no m/r/g, no peripheral edema Musculoskeletal: No deformities, no cyanosis or clubbing, normal tone Neuro:  A&Ox3, CN II-XII intact, normal gait Skin:  Warm, no lesions/ rash   Wt Readings from Last 3 Encounters:  11/24/21 235 lb 6.4 oz (106.8 kg)  09/10/21 235 lb 12.8 oz (107 kg)  11/22/20 235 lb (106.6 kg)    Lab Results  Component Value Date   WBC 7.2 09/10/2021   HGB 15.7 (H) 09/10/2021   HCT 47.6 (H) 09/10/2021   PLT 222.0 09/10/2021   GLUCOSE 100 (H) 09/20/2021   CHOL 264 (H) 09/10/2021   TRIG 108.0 09/10/2021   HDL 65.00 09/10/2021   LDLCALC 177 (H) 09/10/2021   ALT 28 09/20/2021   AST 23 09/20/2021   NA 141 09/20/2021   K 4.2 09/20/2021   CL 106 09/20/2021   CREATININE 1.06 09/20/2021   BUN 14 09/20/2021   CO2 25 09/20/2021   TSH 5.42 09/10/2021   HGBA1C 5.7 09/10/2021   MICROALBUR 0.4 06/19/2009    Assessment/Plan:  Chronic left-sided low back pain with left-sided sciatica  -Likely 2/2 history of lumbar disc disease -Discussed supportive care such as continuing stretching, topical analgesics, Tylenol or NSAIDs, etc. -Patient advised that sitting in chairs that are too soft or sleeping on a mattress that is too soft may be making symptoms worse. -Flexeril 5 mg nightly as needed -For continued or worsening  symptoms obtain imaging, PT, and possible referral to Ortho or neurosurg  -Plan: cyclobenzaprine (FLEXERIL) 5 MG tablet  Mixed hyperlipidemia -Total cholesterol 264, HDL 65, LDL 177, triglycerides 108 on 09/10/2021 -Lifestyle modifications -Recheck cholesterol in the next few months at patient's convenience -For continued elevation recommend starting statin.  F/u as needed in the next few months  Abbe Amsterdam, MD

## 2021-12-14 LAB — HM MAMMOGRAPHY

## 2021-12-22 ENCOUNTER — Encounter: Payer: Self-pay | Admitting: Family Medicine

## 2022-04-11 ENCOUNTER — Ambulatory Visit (INDEPENDENT_AMBULATORY_CARE_PROVIDER_SITE_OTHER): Payer: 59 | Admitting: Family Medicine

## 2022-04-11 ENCOUNTER — Encounter: Payer: Self-pay | Admitting: Family Medicine

## 2022-04-11 VITALS — BP 124/84 | HR 70 | Temp 98.6°F | Wt 243.6 lb

## 2022-04-11 DIAGNOSIS — M5442 Lumbago with sciatica, left side: Secondary | ICD-10-CM | POA: Diagnosis not present

## 2022-04-11 DIAGNOSIS — G8929 Other chronic pain: Secondary | ICD-10-CM

## 2022-04-11 MED ORDER — GABAPENTIN 100 MG PO CAPS: 100.0000 mg | ORAL_CAPSULE | Freq: Every evening | ORAL | 0 refills | Status: DC | PRN

## 2022-04-11 MED ORDER — MELOXICAM 15 MG PO TABS: 15.0000 mg | ORAL_TABLET | Freq: Every day | ORAL | 0 refills | Status: DC

## 2022-04-11 NOTE — Progress Notes (Signed)
Subjective:    Patient ID: Lauren Fox, female    DOB: 1965/08/05, 56 y.o.   MRN: [id number]  Chief Complaint  Patient presents with   Back Pain    lower    HPI Patient is a 56 yo female with pmh sig for allergies, plantar fasciitis, HLD, gout, obesity, lumbar disc dz, chronic back pain who was seen today for ongoing concern.  Pt last seen in clinic over the summer for low back pain issues.  Symptoms have progressed.  Patient endorses increased left-sided low back pain with numbness, tingling, burning sensation radiating into left leg.  Sciatica symptoms 2 months ago with walking, standing, or prolonged sitting.  L LE has felt weak but did not give.  Denies injury, heavy lifting, pushing, or pulling.  Patient tried naproxen, heat, ice, IcyHot, pain patches, muscle relaxer and Tylenol.  Patient endorses history of disc rupture several years ago.  Was seen by sports medicine at the time.  Had epidural injections years ago.  Past Medical History:  Diagnosis Date   Acute low back pain 06/10/2013   ALLERGIC RHINITIS 11/25/2008   Arthritis    DIZZINESS 11/25/2008   FOOT PAIN, LEFT 12/19/2009   GLUCOSE INTOLERANCE 11/25/2008   Gout, unspecified 11/25/2008   Headache(784.0) 06/22/2010   HYPERLIPIDEMIA 11/25/2008   Impaired glucose tolerance 08/24/2010   Lumbar disc disease 02/21/2012   Morbid obesity ([place]) 11/25/2008   Unspecified backache 12/17/2009    No Known Allergies  ROS General: Denies fever, chills, night sweats, changes in weight, changes in appetite HEENT: Denies headaches, ear pain, changes in vision, rhinorrhea, sore throat CV: Denies CP, palpitations, SOB, orthopnea Pulm: Denies SOB, cough, wheezing GI: Denies abdominal pain, nausea, vomiting, diarrhea, constipation GU: Denies dysuria, hematuria, frequency, vaginal discharge Msk: Denies muscle cramps, joint pains  +L sided low back pain with radiation into LLE Neuro: Denies weakness, numbness, tingling Skin: Denies rashes,  bruising Psych: Denies depression, anxiety, hallucinations      Objective:    Blood pressure 124/84, pulse 70, temperature 98.6 F (37 C), temperature source Oral, weight 243 lb 9.6 oz (110.5 kg), SpO2 98 %.  Gen. Pleasant, well-nourished, in no distress, normal affect   HEENT: [place]/AT, face symmetric, conjunctiva clear, no scleral icterus, PERRLA, EOMI, nares patent without drainage Lungs: no accessory muscle use Cardiovascular: RRR, no peripheral edema Musculoskeletal: No TTP of midline cervical, thoracic, or lumbar spine.  TTP of LL back and medial buttock causing pain in LLE.  No deformities, no cyanosis or clubbing, normal tone Neuro:  A&Ox3, CN II-XII intact, normal gait Skin:  Warm, no lesions/ rash   Wt Readings from Last 3 Encounters:  04/11/22 243 lb 9.6 oz (110.5 kg)  11/24/21 235 lb 6.4 oz (106.8 kg)  09/10/21 235 lb 12.8 oz (107 kg)    Lab Results  Component Value Date   WBC 7.2 09/10/2021   HGB 15.7 (H) 09/10/2021   HCT 47.6 (H) 09/10/2021   PLT 222.0 09/10/2021   GLUCOSE 100 (H) 09/20/2021   CHOL 264 (H) 09/10/2021   TRIG 108.0 09/10/2021   HDL 65.00 09/10/2021   LDLCALC 177 (H) 09/10/2021   ALT 28 09/20/2021   AST 23 09/20/2021   NA 141 09/20/2021   K 4.2 09/20/2021   CL 106 09/20/2021   CREATININE 1.06 09/20/2021   BUN 14 09/20/2021   CO2 25 09/20/2021   TSH 5.42 09/10/2021   HGBA1C 5.7 09/10/2021   MICROALBUR 0.4 06/19/2009    Assessment/Plan:  Chronic left-sided  low back pain with left-sided sciatica - Plan: meloxicam (MOBIC) 15 MG tablet, gabapentin (NEURONTIN) 100 MG capsule, Ambulatory referral to Orthopedic Surgery  Given history of chronic back issues/ h/o disc rupture will place referral to Ortho.  Continue supportive care including stretching, ice, heat, topical analgesics.  Discussed trying gabapentin nightly as needed.  If helpful may use during the day it does not cause drowsiness.  Will also start Mobic to reduce inflammation.  Given  strict precautions.  F/u prn  Abbe Amsterdam, MD

## 2022-04-11 NOTE — Patient Instructions (Signed)
Referral to Ortho was placed.  Prescriptions for Mobic (an anti-inflammatory) and gabapentin (a nerve pain medication) were sent to your pharmacy.  Continue supportive care.

## 2022-09-21 ENCOUNTER — Ambulatory Visit (INDEPENDENT_AMBULATORY_CARE_PROVIDER_SITE_OTHER): Payer: 59 | Admitting: Family Medicine

## 2022-09-21 VITALS — BP 118/70 | HR 70 | Temp 98.7°F | Ht 64.0 in | Wt 245.2 lb

## 2022-09-21 DIAGNOSIS — Z0001 Encounter for general adult medical examination with abnormal findings: Secondary | ICD-10-CM | POA: Diagnosis not present

## 2022-09-21 DIAGNOSIS — R030 Elevated blood-pressure reading, without diagnosis of hypertension: Secondary | ICD-10-CM | POA: Diagnosis not present

## 2022-09-21 DIAGNOSIS — E782 Mixed hyperlipidemia: Secondary | ICD-10-CM

## 2022-09-21 DIAGNOSIS — Z6841 Body Mass Index (BMI) 40.0 and over, adult: Secondary | ICD-10-CM

## 2022-09-21 DIAGNOSIS — H6992 Unspecified Eustachian tube disorder, left ear: Secondary | ICD-10-CM

## 2022-09-21 DIAGNOSIS — R52 Pain, unspecified: Secondary | ICD-10-CM

## 2022-09-21 DIAGNOSIS — N951 Menopausal and female climacteric states: Secondary | ICD-10-CM

## 2022-09-21 LAB — LIPID PANEL
Cholesterol: 239 mg/dL — ABNORMAL HIGH (ref 0–200)
HDL: 48.2 mg/dL (ref >39.00)
LDL Cholesterol: 175 mg/dL — ABNORMAL HIGH (ref 0–99)
NonHDL: 191.19
Total CHOL/HDL Ratio: 5
Triglycerides: 79 mg/dL (ref 0.0–149.0)
VLDL: 15.8 mg/dL (ref 0.0–40.0)

## 2022-09-21 LAB — VITAMIN D 25 HYDROXY (VIT D DEFICIENCY, FRACTURES): VITD: 37.11 ng/mL (ref 30.00–100.00)

## 2022-09-21 LAB — COMPREHENSIVE METABOLIC PANEL
ALT: 27 U/L (ref 0–35)
AST: 25 U/L (ref 0–37)
Albumin: 4.7 g/dL (ref 3.5–5.2)
Alkaline Phosphatase: 64 U/L (ref 39–117)
BUN: 10 mg/dL (ref 6–23)
CO2: 25 mEq/L (ref 19–32)
Calcium: 10.3 mg/dL (ref 8.4–10.5)
Chloride: 105 mEq/L (ref 96–112)
Creatinine, Ser: 1.01 mg/dL (ref 0.40–1.20)
GFR: 63.15 mL/min (ref >60.00)
Glucose, Bld: 76 mg/dL (ref 70–99)
Potassium: 4.3 mEq/L (ref 3.5–5.1)
Sodium: 141 mEq/L (ref 135–145)
Total Bilirubin: 0.4 mg/dL (ref 0.2–1.2)
Total Protein: 7.9 g/dL (ref 6.0–8.3)

## 2022-09-21 LAB — CBC WITH DIFFERENTIAL/PLATELET
Basophils Absolute: 0.1 10*3/uL (ref 0.0–0.1)
Basophils Relative: 0.9 % (ref 0.0–3.0)
Eosinophils Absolute: 0.4 10*3/uL (ref 0.0–0.7)
Eosinophils Relative: 6 % — ABNORMAL HIGH (ref 0.0–5.0)
HCT: 45 % (ref 36.0–46.0)
Hemoglobin: 15.3 g/dL — ABNORMAL HIGH (ref 12.0–15.0)
Lymphocytes Relative: 51.7 % — ABNORMAL HIGH (ref 12.0–46.0)
Lymphs Abs: 3.4 10*3/uL (ref 0.7–4.0)
MCHC: 34.1 g/dL (ref 30.0–36.0)
MCV: 94.1 fl (ref 78.0–100.0)
Monocytes Absolute: 0.4 10*3/uL (ref 0.1–1.0)
Monocytes Relative: 6.7 % (ref 3.0–12.0)
Neutro Abs: 2.3 10*3/uL (ref 1.4–7.7)
Neutrophils Relative %: 34.7 % — ABNORMAL LOW (ref 43.0–77.0)
Platelets: 216 10*3/uL (ref 150.0–400.0)
RBC: 4.78 Mil/uL (ref 3.87–5.11)
RDW: 13.5 % (ref 11.5–15.5)
WBC: 6.5 10*3/uL (ref 4.0–10.5)

## 2022-09-21 LAB — T4, FREE: Free T4: 0.88 ng/dL (ref 0.60–1.60)

## 2022-09-21 LAB — VITAMIN B12: Vitamin B-12: 486 pg/mL (ref 211–911)

## 2022-09-21 LAB — TSH: TSH: 3.51 u[IU]/mL (ref 0.35–5.50)

## 2022-09-21 LAB — HEMOGLOBIN A1C: Hgb A1c MFr Bld: 5.9 % (ref 4.6–6.5)

## 2022-09-21 NOTE — Progress Notes (Signed)
Established Patient Office Visit   Subjective  Patient ID: Lauren Fox, female    DOB: 1965/12/29  Age: 57 y.o. MRN: [id number]  Chief Complaint  Patient presents with   Annual Exam    Left ear, getting over sinus infection     Pt is a 57 yo female with pmh sig for HLD seen for CPE.  Pt seen by OB/Gyn.  Taking Equell for hot flashes.  Concerned about wt gain.  Makes chagnes at home, but does not maintain them.  Started meal prepping, doing smoothies or special K for breakfast.    Pt with soreness of posterior hand/fingers with making a fist.  Denies edema, erythema, paresthesias, injury.  Endorses left ear discomfort after recent sinus infection.    Patient Active Problem List   Diagnosis Date Noted   Menopausal flushing 01/18/2018   Night sweats 01/18/2018   De Quervain's syndrome (tenosynovitis) 09/04/2017   Left otitis media 04/12/2017   Vertigo 10/25/2014   Pain and swelling of ankle 02/23/2014   Hyperprolactinemia (HCC) 02/21/2012   Lumbar disc disease 02/21/2012   Heart palpitations 02/21/2012   Vitamin D deficiency 02/23/2011   Impaired glucose tolerance 08/24/2010   Preventative health care 08/24/2010   HYPERLIPIDEMIA 11/25/2008   Gout, unspecified 11/25/2008   Morbid obesity (HCC) 11/25/2008   Allergic rhinitis 11/25/2008   Past Surgical History:  Procedure Laterality Date   ABDOMINAL HYSTERECTOMY  20063   non malignant   s/p pilonidal cyst     TUBAL LIGATION     Social History   Tobacco Use   Smoking status: Former    Types: Cigarettes    Quit date: 05/16/2012    Years since quitting: 10.4   Smokeless tobacco: Never   Tobacco comments:    pt verbalized never smoking  Substance Use Topics   Alcohol use: Yes    Alcohol/week: 0.0 standard drinks of alcohol    Comment: social   Drug use: No    Types: Benzodiazepines   Family History  Problem Relation Age of Onset   Hyperlipidemia Mother    Hypertension Mother    Glaucoma Father     Hypertension Father    Diabetes Father    Diabetes Other    Heart attack Other    Coronary artery disease Other    Diabetes Other    Colon cancer Neg Hx    Esophageal cancer Neg Hx    Rectal cancer Neg Hx    Stomach cancer Neg Hx    No Known Allergies    ROS Negative unless stated above    Objective:     BP 118/70 (BP Location: Right Arm, Cuff Size: Large)   Pulse 70   Temp 98.7 F (37.1 C) (Oral)   Ht 5\' 4"  (1.626 m)   Wt 245 lb 3.2 oz (111.2 kg)   SpO2 96%   BMI 42.09 kg/m    Physical Exam Constitutional:      Appearance: Normal appearance.  HENT:     Head: Normocephalic and atraumatic.     Right Ear: Tympanic membrane, ear canal and external ear normal.     Left Ear: Tympanic membrane, ear canal and external ear normal.     Nose: Nose normal.     Mouth/Throat:     Mouth: Mucous membranes are moist.     Pharynx: No oropharyngeal exudate or posterior oropharyngeal erythema.  Eyes:     General: No scleral icterus.    Extraocular Movements: Extraocular movements intact.  Conjunctiva/sclera: Conjunctivae normal.     Pupils: Pupils are equal, round, and reactive to light.  Neck:     Thyroid: No thyromegaly.  Cardiovascular:     Rate and Rhythm: Normal rate and regular rhythm.     Pulses: Normal pulses.     Heart sounds: Normal heart sounds. No murmur heard.    No friction rub.  Pulmonary:     Effort: Pulmonary effort is normal.     Breath sounds: Normal breath sounds. No wheezing, rhonchi or rales.  Abdominal:     General: Bowel sounds are normal.     Palpations: Abdomen is soft.     Tenderness: There is no abdominal tenderness.  Musculoskeletal:        General: No deformity. Normal range of motion.     Comments: TTP of dorsum of fingers after making a fist.  Lymphadenopathy:     Cervical: No cervical adenopathy.  Skin:    General: Skin is warm and dry.     Findings: No lesion.  Neurological:     General: No focal deficit present.     Mental  Status: She is alert and oriented to person, place, and time.  Psychiatric:        Mood and Affect: Mood normal.        Thought Content: Thought content normal.     No results found for any visits on 09/21/22.    Assessment & Plan:  Encounter for routine adult health examination with abnormal findings -Age-appropriate health screenings discussed -Immunizations reviewed -Mammogram up-to-date done 12/14/2021 -Pap not indicated 2/2 hysterectomy -Colonoscopy done 10/15/2018 -Next CPE in 1 year  Elevated blood pressure reading in office without diagnosis of hypertension -BP initially elevated 142/120 -Normal on recheck. -Discussed the importance of lifestyle modifications -Patient to monitor BP at home and keep a log to bring with her to clinic -     TSH -     T4, free -     Lipid panel -     Comprehensive metabolic panel  Hot flashes due to menopause -     CBC with Differential/Platelet -     TSH -     T4, free  Soreness-type pain -     CBC with Differential/Platelet -     Vitamin B12  Class 3 severe obesity with serious comorbidity and body mass index (BMI) of 40.0 to 44.9 in adult, unspecified obesity type (HCC) -Body mass index is 42.09 kg/m. -Continue lifestyle modifications.  Discussed the importance of being consistent -Weight management referral -     TSH -     T4, free -     Hemoglobin A1c -     Lipid panel -     Comprehensive metabolic panel -     VITAMIN D 25 Hydroxy (Vit-D Deficiency, Fractures) -     Amb Ref to [facility] Weight [facility]  Mixed hyperlipidemia -     Lipid panel  Dysfunction of left eustachian tube -OTC antihistamine  Return in about 3 months (around 12/22/2022).   Deeann Saint, MD

## 2022-09-28 ENCOUNTER — Encounter: Payer: Self-pay | Admitting: Family Medicine

## 2022-12-04 DIAGNOSIS — R7303 Prediabetes: Secondary | ICD-10-CM | POA: Insufficient documentation

## 2022-12-04 NOTE — Progress Notes (Unsigned)
Office: [phone]  /  Fax: [phone]   TeleHealth Visit:  This visit was completed with telemedicine (audio/video) technology. Doryce has verbally consented to this TeleHealth visit. The patient is located at home, the provider is located at home. The participants in this visit include the listed provider and patient. The visit was conducted today via MyChart video.  Initial Visit  ALTHERIA SHADOAN was seen via virtual visit today to evaluate for treatment of obesity. She is interested in losing weight to improve overall health and reduce the risk of weight related complications. She presents today to review program treatment options, initial physical assessment, and evaluation.     Height: 5\' 4"  Weight: 245 lbs BMI: 42  She was referred by: PCP  When asked what else they would like to accomplish? She states: {EMHopetoaccomplish:28304}  Weight history: ***  When asked how has your weight affected you? She states: {EMWeightAffected:28305}  Some associated conditions: Hyperlipidemia, Prediabetes, and Vitamin D Deficiency  Contributing factors: {EMcontributingfactors:28307}  Weight promoting medications identified: {EMWeightpromotingrx:28308}  Current nutrition plan: {EMNutritionplan:28309::"None"}  Current level of physical activity: {EMcurrentPA:28310::"None"}  Current or previous pharmacotherapy: {EM previousRx:28311}  Response to medication: {EMResponsetomedication:28312}  Past medical history includes:   Past Medical History:  Diagnosis Date   Acute low back pain 06/10/2013   ALLERGIC RHINITIS 11/25/2008   Arthritis    DIZZINESS 11/25/2008   FOOT PAIN, LEFT 12/19/2009   GLUCOSE INTOLERANCE 11/25/2008   Gout, unspecified 11/25/2008   Headache(784.0) 06/22/2010   HYPERLIPIDEMIA 11/25/2008   Impaired glucose tolerance 08/24/2010   Lumbar disc disease 02/21/2012   Morbid obesity (HCC) 11/25/2008   Unspecified backache 12/17/2009     Objective:     General:  Alert,  oriented and cooperative. Patient is in no acute distress.  Respiratory: Normal respiratory effort, no problems with respiration noted  Mental Status: Normal mood and affect. Normal behavior. Normal judgment and thought content.    Assessment and Plan:   1. Mixed Hyperlipidemia LDL is not at goal. FLP on 09/21/22: LDL elevated 175, HDL low at 48, triglycerides normal at 79. Medication(s): none 10-year ASCVD risk score is 3%. Lab Results  Component Value Date   CHOL 239 (H) 09/21/2022   HDL 48.20 09/21/2022   LDLCALC 175 (H) 09/21/2022   TRIG 79.0 09/21/2022   CHOLHDL 5 09/21/2022   CHOLHDL 4 09/10/2021   CHOLHDL 5 09/10/2020   Lab Results  Component Value Date   ALT 27 09/21/2022   AST 25 09/21/2022   ALKPHOS 64 09/21/2022   BILITOT 0.4 09/21/2022   The 10-year ASCVD risk score (Arnett DK, et al., 2019) is: 3%   Values used to calculate the score:     Age: 68 years     Sex: Female     Is Non-Hispanic African American: Yes     Diabetic: No     Tobacco smoker: No     Systolic Blood Pressure: 118 mmHg     Is BP treated: No     HDL Cholesterol: 48.2 mg/dL     Total Cholesterol: 239 mg/dL  Plan: Work on lifestyle interventions in conjunction with our program.  2. Prediabetes Last A1c was 5.9 on 09/21/22  Medication(s): None Lab Results  Component Value Date   HGBA1C 5.9 09/21/2022   HGBA1C 5.7 09/10/2021   HGBA1C 5.7 09/10/2020   HGBA1C 5.5 04/11/2018   HGBA1C 5.5 04/12/2017   No results found for: "INSULIN"  Plan: Work on lifestyle interventions in conjunction with our program.  3. Obesity  Treatment / Action Plan:  Will complete provided nutritional and psychosocial assessment questionnaire before the next appointment. Will be scheduled for indirect calorimetry to determine resting energy expenditure in a fasting state.  This will allow [place] to create a reduced calorie, high-protein meal plan to promote loss of fat mass while preserving muscle mass. Was counseled  on pharmacotherapy and role as an adjunct in weight management.   Obesity Education Performed Today:  We discussed obesity as a disease and the importance of a more detailed evaluation of all the factors contributing to the disease.  We discussed the importance of long term lifestyle changes which include nutrition, exercise and behavioral modifications as well as the importance of customizing this to her specific health and social needs.  We discussed the benefits of reaching a healthier weight to alleviate the symptoms of existing conditions and reduce the risks of the biomechanical, metabolic and psychological effects of obesity.  [facility]rell Ours appears to be in the action stage of change and states they are ready to start intensive lifestyle modifications and behavioral modifications.  ______________________________________________________________________________  She will be contacted by [facility] to set up initial appointment and the first follow up appointment with a physician.   The following office policies were discussed. She voiced understanding: - Patient will be considered late at 6 minutes past appointment time.   - For the first office visit, patient needs to arrive 1 hour early, fasting except for water. Patient should arrive 15 minutes early for all other visits. -  Patient will bring completed new patient paperwork to first office visit.  If not, appointment will be rescheduled.  30 minutes was spent today on this visit including the above counseling, pre-visit chart review, and post-visit documentation.  Reviewed by clinician on day of visit: allergies, medications, problem list, medical history, surgical history, family history, social history, and previous encounter notes pertinent to obesity diagnosis.    Jesse Sans, FNP

## 2022-12-05 ENCOUNTER — Encounter (INDEPENDENT_AMBULATORY_CARE_PROVIDER_SITE_OTHER): Payer: Self-pay | Admitting: Family Medicine

## 2022-12-05 ENCOUNTER — Telehealth (INDEPENDENT_AMBULATORY_CARE_PROVIDER_SITE_OTHER): Payer: 59 | Admitting: Family Medicine

## 2022-12-05 DIAGNOSIS — E782 Mixed hyperlipidemia: Secondary | ICD-10-CM | POA: Diagnosis not present

## 2022-12-05 DIAGNOSIS — E669 Obesity, unspecified: Secondary | ICD-10-CM

## 2022-12-05 DIAGNOSIS — R7303 Prediabetes: Secondary | ICD-10-CM | POA: Diagnosis not present

## 2022-12-05 DIAGNOSIS — Z6841 Body Mass Index (BMI) 40.0 and over, adult: Secondary | ICD-10-CM

## 2022-12-07 DIAGNOSIS — Z0289 Encounter for other administrative examinations: Secondary | ICD-10-CM

## 2022-12-22 ENCOUNTER — Ambulatory Visit: Payer: 59 | Admitting: Family Medicine

## 2022-12-22 VITALS — BP 120/84 | HR 65 | Temp 98.7°F | Wt 251.2 lb

## 2022-12-22 DIAGNOSIS — M5127 Other intervertebral disc displacement, lumbosacral region: Secondary | ICD-10-CM | POA: Diagnosis not present

## 2022-12-22 DIAGNOSIS — Z6841 Body Mass Index (BMI) 40.0 and over, adult: Secondary | ICD-10-CM

## 2022-12-22 DIAGNOSIS — M5442 Lumbago with sciatica, left side: Secondary | ICD-10-CM

## 2022-12-22 DIAGNOSIS — Z0130 Encounter for examination of blood pressure without abnormal findings: Secondary | ICD-10-CM

## 2022-12-22 DIAGNOSIS — G8929 Other chronic pain: Secondary | ICD-10-CM

## 2022-12-22 NOTE — Progress Notes (Signed)
Established Patient Office Visit   Subjective  Patient ID: Lauren Fox, female    DOB: [birth year]  Age: 57 y.o. MRN: [id number]  Chief Complaint  Patient presents with   Medical Management of Chronic Issues    Finally got a call from [facility], appt scheduled.     Pt is a 57 yo female seen for f/u on BP.  At last OFV BP 142/120.  BP much better today.  Pt states she has been feeling better.  Got a little sad the other day as it was her mother's birthday.  Continues to work through grieving process.  Had epidural injections at [facility], Dr. Jillyn Hidden.  Herniated disc "not compressing the nerve but touching it".  Back pain 8-9/10 with standing or walking too long.  Feels ok if sitting.  If overdoes activity back burns.  Occasionally has paresthesias in LLE, but currently not as bad.  Tried patches, Tylenol.  Patient notes some improvement in hot flashes.  States if he eats onions will make them occur.      Past Medical History:  Diagnosis Date   Acute low back pain 06/10/2013   ALLERGIC RHINITIS 11/25/2008   Arthritis    DIZZINESS 11/25/2008   FOOT PAIN, LEFT 12/19/2009   GLUCOSE INTOLERANCE 11/25/2008   Gout, unspecified 11/25/2008   Headache(784.0) 06/22/2010   HYPERLIPIDEMIA 11/25/2008   Impaired glucose tolerance 08/24/2010   Lumbar disc disease 02/21/2012   Morbid obesity (HCC) 11/25/2008   Unspecified backache 12/17/2009   Past Surgical History:  Procedure Laterality Date   ABDOMINAL HYSTERECTOMY  20063   non malignant   s/p pilonidal cyst     TUBAL LIGATION     Social History   Tobacco Use   Smoking status: Former    Current packs/day: 0.00    Types: Cigarettes    Quit date: 05/16/2012    Years since quitting: 10.6   Smokeless tobacco: Never   Tobacco comments:    pt verbalized never smoking  Substance Use Topics   Alcohol use: Yes    Alcohol/week: 0.0 standard drinks of alcohol    Comment: social   Drug use: No    Types: Benzodiazepines   Family  History  Problem Relation Age of Onset   Hyperlipidemia Mother    Hypertension Mother    Glaucoma Father    Hypertension Father    Diabetes Father    Diabetes Other    Heart attack Other    Coronary artery disease Other    Diabetes Other    Colon cancer Neg Hx    Esophageal cancer Neg Hx    Rectal cancer Neg Hx    Stomach cancer Neg Hx    No Known Allergies    ROS Negative unless stated above    Objective:     BP 120/84 (BP Location: Left Arm, Patient Position: Sitting, Cuff Size: Large)   Pulse 65   Temp 98.7 F (37.1 C) (Oral)   Wt 251 lb 3.2 oz (113.9 kg)   SpO2 98%   BMI 43.12 kg/m    BP Readings from Last 3 Encounters:  12/22/22 120/84  09/21/22 118/70  04/11/22 124/84   Wt Readings from Last 3 Encounters:  12/22/22 251 lb 3.2 oz (113.9 kg)  09/21/22 245 lb 3.2 oz (111.2 kg)  04/11/22 243 lb 9.6 oz (110.5 kg)      Physical Exam Constitutional:      General: She is not in acute distress.  Appearance: Normal appearance.  HENT:     Head: Normocephalic and atraumatic.     Nose: Nose normal.     Mouth/Throat:     Mouth: Mucous membranes are moist.  Cardiovascular:     Rate and Rhythm: Normal rate and regular rhythm.     Heart sounds: Normal heart sounds. No murmur heard.    No gallop.  Pulmonary:     Effort: Pulmonary effort is normal. No respiratory distress.     Breath sounds: Normal breath sounds. No wheezing, rhonchi or rales.  Skin:    General: Skin is warm and dry.  Neurological:     Mental Status: She is alert and oriented to person, place, and time.     No results found for any visits on 12/22/22.    Assessment & Plan:  Chronic left-sided low back pain with left-sided sciatica  Herniation of intervertebral disc between L5 and S1  Class 3 severe obesity due to excess calories with serious comorbidity and body mass index (BMI) of 40.0 to 44.9 in adult (HCC)  BP check  BP well-controlled this visit.  Continue to monitor.  For  continued elevation consistently greater than 140/90 start medication.  Continue follow-up with Ortho for chronic back pain and herniated disc.  Consider referral to pain management.  Currently limited with exercise due to back pain.  Discussed ways to modify workout.  Continue dietary changes.  Consider weight management referral.  Return if symptoms worsen or fail to improve.   Deeann Saint, MD

## 2022-12-22 NOTE — Patient Instructions (Addendum)
[facility]  [address] [place], [place] [address]  [email or web address] [phone]

## 2022-12-30 ENCOUNTER — Encounter: Payer: Self-pay | Admitting: Family Medicine

## 2023-01-09 ENCOUNTER — Encounter (INDEPENDENT_AMBULATORY_CARE_PROVIDER_SITE_OTHER): Payer: Self-pay | Admitting: Family Medicine

## 2023-01-09 ENCOUNTER — Ambulatory Visit (INDEPENDENT_AMBULATORY_CARE_PROVIDER_SITE_OTHER): Payer: 59 | Admitting: Family Medicine

## 2023-01-09 ENCOUNTER — Other Ambulatory Visit (INDEPENDENT_AMBULATORY_CARE_PROVIDER_SITE_OTHER): Payer: Self-pay | Admitting: Family Medicine

## 2023-01-09 VITALS — BP 136/62 | HR 66 | Temp 97.8°F | Ht 64.0 in | Wt 245.0 lb

## 2023-01-09 DIAGNOSIS — F32A Depression, unspecified: Secondary | ICD-10-CM | POA: Diagnosis not present

## 2023-01-09 DIAGNOSIS — R5383 Other fatigue: Secondary | ICD-10-CM

## 2023-01-09 DIAGNOSIS — E669 Obesity, unspecified: Secondary | ICD-10-CM

## 2023-01-09 DIAGNOSIS — Z1331 Encounter for screening for depression: Secondary | ICD-10-CM

## 2023-01-09 DIAGNOSIS — R7303 Prediabetes: Secondary | ICD-10-CM

## 2023-01-09 DIAGNOSIS — E559 Vitamin D deficiency, unspecified: Secondary | ICD-10-CM

## 2023-01-09 DIAGNOSIS — Z6841 Body Mass Index (BMI) 40.0 and over, adult: Secondary | ICD-10-CM

## 2023-01-09 DIAGNOSIS — E782 Mixed hyperlipidemia: Secondary | ICD-10-CM | POA: Diagnosis not present

## 2023-01-09 DIAGNOSIS — R0602 Shortness of breath: Secondary | ICD-10-CM | POA: Diagnosis not present

## 2023-01-09 NOTE — Progress Notes (Signed)
Chief Complaint:   OBESITY Lauren Fox (MR# [id number]) is a 57 y.o. female who presents for evaluation and treatment of obesity and related comorbidities. Current BMI is Body mass index is 42.05 kg/m. Lauren Fox has been struggling with her weight for many years and has been unsuccessful in either losing weight, maintaining weight loss, or reaching her healthy weight goal.  Lauren Fox is currently in the action stage of change and ready to dedicate time achieving and maintaining a healthier weight. Lauren Fox is interested in becoming our patient and working on intensive lifestyle modifications including (but not limited to) diet and exercise for weight loss.  Lauren Fox's habits were reviewed today and are as follows: Her family eats meals together, she thinks her family will eat healthier with her, her desired weight loss is 75-85 lbs, she started gaining weight about 64+ years old, her heaviest weight ever was 249 pounds, she has significant food cravings issues, she skips meals frequently, she is frequently drinking liquids with calories, she frequently makes poor food choices, she frequently eats larger portions than normal, and she struggles with emotional eating.  Depression Screen Lauren Fox's Food and Mood (modified PHQ-9) score was 5.  Subjective:   1. Other fatigue Lauren Fox admits to daytime somnolence and denies waking up still tired. Patient has a history of symptoms of daytime fatigue. Lauren Fox generally gets 6 or 8 hours of sleep per night, and states that she has generally restful sleep. Snoring is present. Apneic episodes are not present. Epworth Sleepiness Score is 3.   2. SOBOE (shortness of breath on exertion) Lauren Fox notes increasing shortness of breath with exercising and seems to be worsening over time with weight gain. She notes getting out of breath sooner with activity than she used to. This has not gotten worse recently. Lauren Fox denies shortness of breath at rest or  orthopnea.  3. Prediabetes Patient has a history of prediabetes and she is ready to work on her diet and weight loss.  4. Mixed hyperlipidemia Patient is on fish oil, and she is trying to work on her diet and weight loss to improve.  5. Vitamin D deficiency Patient is on OTC vitamin D, and her level had been very low in the past.  Her level is not yet at our goal.  Assessment/Plan:   1. Other fatigue Lauren Fox does feel that her weight is causing her energy to be lower than it should be. Fatigue may be related to obesity, depression or many other causes. Labs will be ordered, and in the meanwhile, Lauren Fox will focus on self care including making healthy food choices, increasing physical activity and focusing on stress reduction.  - EKG 12-Lead - CBC with Differential/Platelet - Magnesium - Phosphorus - TSH - Folate  2. SOBOE (shortness of breath on exertion) Lauren Fox does feel that she gets out of breath more easily that she used to when she exercises. Lauren Fox's shortness of breath appears to be obesity related and exercise induced. She has agreed to work on weight loss and gradually increase exercise to treat her exercise induced shortness of breath. Will continue to monitor closely.  3. Prediabetes We will check labs today.  Patient will start her category 2 plan, and we will follow-up at her next visit.  - Vitamin B12 - CMP14+EGFR - Insulin, random - Hemoglobin A1c  4. Mixed hyperlipidemia We will check labs today.  Patient will start her category 2 plan, and we will follow-up at her next visit.  - Lipid Panel  With LDL/HDL Ratio  5. Vitamin D deficiency We will check labs today, we will follow-up at patient's next visit.  - VITAMIN D 25 Hydroxy (Vit-D Deficiency, Fractures)  6. Depression screening Lauren Fox had a positive depression screening. Depression is commonly associated with obesity and often results in emotional eating behaviors. We will monitor this closely and work on  CBT to help improve the non-hunger eating patterns. Referral to Psychology may be required if no improvement is seen as she continues in our clinic.  7. BMI 40.0-44.9, adult (HCC)  8. Obesity, Beginning BMI 42.1 Lauren Fox is currently in the action stage of change and her goal is to continue with weight loss efforts. I recommend [name] begin the structured treatment plan as follows:  She has agreed to the Category 2 Plan + 100 calories.  Exercise goals: No exercise has been prescribed for now, while we concentrate on nutritional changes.  Behavioral modification strategies: increasing lean protein intake and no skipping meals.  She was informed of the importance of frequent follow-up visits to maximize her success with intensive lifestyle modifications for her multiple health conditions. She was informed we would discuss her lab results at her next visit unless there is a critical issue that needs to be addressed sooner. Lauren Fox agreed to keep her next visit at the agreed upon time to discuss these results.  Objective:   Blood pressure 136/62, pulse 66, temperature 97.8 F (36.6 C), height 5\' 4"  (1.626 m), weight 245 lb (111.1 kg), SpO2 97%. Body mass index is 42.05 kg/m.  EKG: Normal sinus rhythm, rate 67 BPM.  Indirect Calorimeter completed today shows a VO2 of 248 and a REE of 1714.  Her calculated basal metabolic rate is 1610 thus her basal metabolic rate is worse than expected.  General: Cooperative, alert, well developed, in no acute distress. HEENT: Conjunctivae and lids unremarkable. Cardiovascular: Regular rhythm.  Lungs: Normal work of breathing. Neurologic: No focal deficits.   Lab Results  Component Value Date   CREATININE 1.01 09/21/2022   BUN 10 09/21/2022   NA 141 09/21/2022   K 4.3 09/21/2022   CL 105 09/21/2022   CO2 25 09/21/2022   Lab Results  Component Value Date   ALT 27 09/21/2022   AST 25 09/21/2022   ALKPHOS 64 09/21/2022   BILITOT 0.4 09/21/2022    Lab Results  Component Value Date   HGBA1C 5.9 09/21/2022   HGBA1C 5.7 09/10/2021   HGBA1C 5.7 09/10/2020   HGBA1C 5.5 04/11/2018   HGBA1C 5.5 04/12/2017   No results found for: "INSULIN" Lab Results  Component Value Date   TSH 3.51 09/21/2022   Lab Results  Component Value Date   CHOL 239 (H) 09/21/2022   HDL 48.20 09/21/2022   LDLCALC 175 (H) 09/21/2022   TRIG 79.0 09/21/2022   CHOLHDL 5 09/21/2022   Lab Results  Component Value Date   WBC 6.5 09/21/2022   HGB 15.3 (H) 09/21/2022   HCT 45.0 09/21/2022   MCV 94.1 09/21/2022   PLT 216.0 09/21/2022   No results found for: "IRON", "TIBC", "FERRITIN"  Attestation Statements:   Reviewed by clinician on day of visit: allergies, medications, problem list, medical history, surgical history, family history, social history, and previous encounter notes.  Time spent on visit including pre-visit chart review and post-visit charting and care was 40 minutes.   I, Burt Knack, am acting as transcriptionist for Quillian Quince, MD.  I have reviewed the above documentation for accuracy and completeness, and I agree  with the above. - Quillian Quince, MD

## 2023-01-10 LAB — CMP14+EGFR
ALT: 21 IU/L (ref 0–32)
AST: 20 IU/L (ref 0–40)
Albumin: 5 g/dL — ABNORMAL HIGH (ref 3.8–4.9)
Alkaline Phosphatase: 74 IU/L (ref 44–121)
BUN/Creatinine Ratio: 14 (ref 9–23)
BUN: 12 mg/dL (ref 6–24)
Bilirubin Total: 0.3 mg/dL (ref 0.0–1.2)
CO2: 21 mmol/L (ref 20–29)
Calcium: 10.1 mg/dL (ref 8.7–10.2)
Chloride: 102 mmol/L (ref 96–106)
Creatinine, Ser: 0.88 mg/dL (ref 0.57–1.00)
Globulin, Total: 2.5 g/dL (ref 1.5–4.5)
Glucose: 82 mg/dL (ref 70–99)
Potassium: 4.7 mmol/L (ref 3.5–5.2)
Sodium: 142 mmol/L (ref 134–144)
Total Protein: 7.5 g/dL (ref 6.0–8.5)
eGFR: 78 mL/min/{1.73_m2} (ref >59)

## 2023-01-10 LAB — CBC WITH DIFFERENTIAL/PLATELET
Basophils Absolute: 0.1 10*3/uL (ref 0.0–0.2)
Basos: 1 %
EOS (ABSOLUTE): 0.6 10*3/uL — ABNORMAL HIGH (ref 0.0–0.4)
Eos: 10 %
Hematocrit: 45.6 % (ref 34.0–46.6)
Hemoglobin: 15.4 g/dL (ref 11.1–15.9)
Immature Grans (Abs): 0 10*3/uL (ref 0.0–0.1)
Immature Granulocytes: 0 %
Lymphocytes Absolute: 3.2 10*3/uL — ABNORMAL HIGH (ref 0.7–3.1)
Lymphs: 51 %
MCH: 31.4 pg (ref 26.6–33.0)
MCHC: 33.8 g/dL (ref 31.5–35.7)
MCV: 93 fL (ref 79–97)
Monocytes Absolute: 0.4 10*3/uL (ref 0.1–0.9)
Monocytes: 7 %
Neutrophils Absolute: 1.9 10*3/uL (ref 1.4–7.0)
Neutrophils: 31 %
Platelets: 213 10*3/uL (ref 150–450)
RBC: 4.91 x10E6/uL (ref 3.77–5.28)
RDW: 12.8 % (ref 11.7–15.4)
WBC: 6.1 10*3/uL (ref 3.4–10.8)

## 2023-01-10 LAB — TSH: TSH: 4.11 u[IU]/mL (ref 0.450–4.500)

## 2023-01-10 LAB — HEMOGLOBIN A1C
Est. average glucose Bld gHb Est-mCnc: 117 mg/dL
Hgb A1c MFr Bld: 5.7 % — ABNORMAL HIGH (ref 4.8–5.6)

## 2023-01-10 LAB — LIPID PANEL WITH LDL/HDL RATIO
Cholesterol, Total: 246 mg/dL — ABNORMAL HIGH (ref 100–199)
HDL: 50 mg/dL (ref >39)
LDL Chol Calc (NIH): 177 mg/dL — ABNORMAL HIGH (ref 0–99)
LDL/HDL Ratio: 3.5 ratio — ABNORMAL HIGH (ref 0.0–3.2)
Triglycerides: 109 mg/dL (ref 0–149)
VLDL Cholesterol Cal: 19 mg/dL (ref 5–40)

## 2023-01-10 LAB — VITAMIN D 25 HYDROXY (VIT D DEFICIENCY, FRACTURES): Vit D, 25-Hydroxy: 27.7 ng/mL — ABNORMAL LOW (ref 30.0–100.0)

## 2023-01-10 LAB — MAGNESIUM: Magnesium: 2.1 mg/dL (ref 1.6–2.3)

## 2023-01-10 LAB — PHOSPHORUS: Phosphorus: 3.4 mg/dL (ref 3.0–4.3)

## 2023-01-10 LAB — FOLATE: Folate: 8.8 ng/mL (ref >3.0)

## 2023-01-10 LAB — VITAMIN B12: Vitamin B-12: 666 pg/mL (ref 232–1245)

## 2023-01-13 ENCOUNTER — Encounter (INDEPENDENT_AMBULATORY_CARE_PROVIDER_SITE_OTHER): Payer: Self-pay | Admitting: Family Medicine

## 2023-01-23 ENCOUNTER — Encounter (INDEPENDENT_AMBULATORY_CARE_PROVIDER_SITE_OTHER): Payer: Self-pay | Admitting: Family Medicine

## 2023-01-23 ENCOUNTER — Ambulatory Visit (INDEPENDENT_AMBULATORY_CARE_PROVIDER_SITE_OTHER): Payer: 59 | Admitting: Family Medicine

## 2023-01-23 VITALS — BP 121/77 | HR 52 | Temp 97.8°F | Ht 64.0 in | Wt 237.0 lb

## 2023-01-23 DIAGNOSIS — E669 Obesity, unspecified: Secondary | ICD-10-CM

## 2023-01-23 DIAGNOSIS — E7800 Pure hypercholesterolemia, unspecified: Secondary | ICD-10-CM

## 2023-01-23 DIAGNOSIS — E559 Vitamin D deficiency, unspecified: Secondary | ICD-10-CM

## 2023-01-23 DIAGNOSIS — R7303 Prediabetes: Secondary | ICD-10-CM

## 2023-01-23 DIAGNOSIS — E785 Hyperlipidemia, unspecified: Secondary | ICD-10-CM

## 2023-01-23 DIAGNOSIS — Z6841 Body Mass Index (BMI) 40.0 and over, adult: Secondary | ICD-10-CM

## 2023-01-23 MED ORDER — VITAMIN D (ERGOCALCIFEROL) 1.25 MG (50000 UNIT) PO CAPS: 50000.0000 [IU] | ORAL_CAPSULE | ORAL | 0 refills | Status: DC

## 2023-01-23 NOTE — Progress Notes (Signed)
[facility]  Office: [phone]  /  Fax: [phone]  WEIGHT SUMMARY AND BIOMETRICS  Anthropometric Measurements Height: 5\' 4"  (1.626 m) Weight: 237 lb (107.5 kg) BMI (Calculated): 40.66 Weight at Last Visit: 245 lb Weight Lost Since Last Visit: 8 lb Weight Gained Since Last Visit: 0 Starting Weight: 245 lb Peak Weight: 245 lb   Body Composition  Body Fat %: 43.9 % Fat Mass (lbs): 104.2 lbs Muscle Mass (lbs): 126.2 lbs Total Body Water (lbs): 85.6 lbs Visceral Fat Rating : 14   Other Clinical Data Fasting: yes Labs: no Today's Visit #: 2 Starting Date: 01/09/23    Chief Complaint: OBESITY   Discussed the use of AI scribe software for clinical note transcription with the patient, who gave verbal consent to proceed.  History of Present Illness   The patient is a 57 year old individual who presented for a follow-up consultation regarding her obesity management. She has been adhering to a category two dietary plan with an additional 100 calories of snacks, which she reports following approximately 100% of the time. Over the past two weeks, the patient has lost eight pounds, despite not currently engaging in any exercise regimen.  The patient reported feeling surprisingly well on the eating plan, noting that she has started to feel different and has noticed a slimming in her waist area. She reported that her hunger is generally well-controlled, and she typically only feels hungry when it is time for her next meal.  The patient's recent lab results showed room for improvement in her cholesterol levels, particularly her LDL cholesterol, which was significantly above the desired range.  The patient's blood sugar regulation was also discussed. Her fasting glucose and hemoglobin A1c levels indicated prediabetes, although there was a slight improvement from earlier in the year.  The patient expressed a preference to continue managing her health through her current dietary plan rather  than adding medication at this time. She reported satisfaction with her current eating plan, which includes a focus on protein and complex carbohydrates. The patient also noted a significant reduction in her consumption of potatoes, a food she previously consumed frequently.  In summary, the patient is a 57 year old individual with obesity and prediabetes, managing her conditions primarily through dietary changes. She has demonstrated significant adherence to her dietary plan and has experienced weight loss and improved feelings of health. However, her cholesterol and vitamin D levels require further management, and her blood sugar regulation will continue to be monitored closely.          PHYSICAL EXAM:  Blood pressure 121/77, pulse (!) 52, temperature 97.8 F (36.6 C), height 5\' 4"  (1.626 m), weight 237 lb (107.5 kg), SpO2 99%. Body mass index is 40.68 kg/m.  DIAGNOSTIC DATA REVIEWED:  BMET    Component Value Date/Time   NA 142 01/09/2023 1307   K 4.7 01/09/2023 1307   CL 102 01/09/2023 1307   CO2 21 01/09/2023 1307   GLUCOSE 82 01/09/2023 1307   GLUCOSE 76 09/21/2022 1054   BUN 12 01/09/2023 1307   CREATININE 0.88 01/09/2023 1307   CALCIUM 10.1 01/09/2023 1307   GFRNONAA 73.28 06/19/2009 1129   Lab Results  Component Value Date   HGBA1C 5.7 (H) 01/09/2023   HGBA1C 5.5 11/25/2008   No results found for: "INSULIN" Lab Results  Component Value Date   TSH 4.110 01/09/2023   CBC    Component Value Date/Time   WBC 6.1 01/09/2023 1307   WBC 6.5 09/21/2022 1054   RBC 4.91 01/09/2023 1307  RBC 4.78 09/21/2022 1054   HGB 15.4 01/09/2023 1307   HCT 45.6 01/09/2023 1307   PLT 213 01/09/2023 1307   MCV 93 01/09/2023 1307   MCH 31.4 01/09/2023 1307   MCH 30.6 10/24/2014 0150   MCHC 33.8 01/09/2023 1307   MCHC 34.1 09/21/2022 1054   RDW 12.8 01/09/2023 1307   Iron Studies No results found for: "IRON", "TIBC", "FERRITIN", "IRONPCTSAT" Lipid Panel     Component Value  Date/Time   CHOL 246 (H) 01/09/2023 1307   TRIG 109 01/09/2023 1307   HDL 50 01/09/2023 1307   CHOLHDL 5 09/21/2022 1054   VLDL 15.8 09/21/2022 1054   LDLCALC 177 (H) 01/09/2023 1307   Hepatic Function Panel     Component Value Date/Time   PROT 7.5 01/09/2023 1307   ALBUMIN 5.0 (H) 01/09/2023 1307   AST 20 01/09/2023 1307   ALT 21 01/09/2023 1307   ALKPHOS 74 01/09/2023 1307   BILITOT 0.3 01/09/2023 1307   BILIDIR 0.1 04/11/2018 1530      Component Value Date/Time   TSH 4.110 01/09/2023 1307   Nutritional Lab Results  Component Value Date   VD25OH 27.7 (L) 01/09/2023   VD25OH 37.11 09/21/2022   VD25OH 36.40 09/10/2021     Assessment and Plan    Obesity Significant weight loss (8 pounds in 2 weeks) on current dietary plan. Patient reports good adherence to the plan and controlled hunger. -Continue current dietary plan. -Consider introducing formal exercise in future visits.  Hyperlipidemia LDL cholesterol elevated at 177 (goal <100). Discussed potential need for medication if unable to improve with diet and weight loss efforts. -Continue dietary modifications and weight loss efforts. -Recheck lipid panel in 3 months.  Vitamin D Deficiency Despite over-the-counter supplementation, vitamin D levels remain low. -Start prescription vitamin D supplementation once weekly for 3 months. -Recheck vitamin D levels in 3 months.  Prediabetes Hemoglobin A1c at 5.7, improved from 5.9. Discussed the role of diet in managing prediabetes and preventing progression to diabetes. -Continue current dietary plan. -Consider medication (Metformin) in the future if needed.  Follow-up Next appointments already scheduled for 02/08/2023 and 02/21/2023.         I have personally spent 40 minutes total time today in preparation, patient care, and documentation for this visit, including the following: review of clinical lab tests; review of medical tests/procedures/services.    She was  informed of the importance of frequent follow up visits to maximize her success with intensive lifestyle modifications for her multiple health conditions.    Quillian Quince, MD

## 2023-01-30 LAB — HM MAMMOGRAPHY

## 2023-01-31 ENCOUNTER — Encounter: Payer: Self-pay | Admitting: Family Medicine

## 2023-02-07 ENCOUNTER — Ambulatory Visit (INDEPENDENT_AMBULATORY_CARE_PROVIDER_SITE_OTHER): Payer: 59 | Admitting: Family Medicine

## 2023-02-08 ENCOUNTER — Ambulatory Visit (INDEPENDENT_AMBULATORY_CARE_PROVIDER_SITE_OTHER): Payer: 59 | Admitting: Family Medicine

## 2023-02-08 ENCOUNTER — Encounter (INDEPENDENT_AMBULATORY_CARE_PROVIDER_SITE_OTHER): Payer: Self-pay | Admitting: Family Medicine

## 2023-02-08 VITALS — BP 118/73 | HR 57 | Temp 98.0°F | Ht 64.0 in | Wt 232.0 lb

## 2023-02-08 DIAGNOSIS — E559 Vitamin D deficiency, unspecified: Secondary | ICD-10-CM | POA: Diagnosis not present

## 2023-02-08 DIAGNOSIS — R7303 Prediabetes: Secondary | ICD-10-CM | POA: Diagnosis not present

## 2023-02-08 DIAGNOSIS — E669 Obesity, unspecified: Secondary | ICD-10-CM | POA: Diagnosis not present

## 2023-02-08 DIAGNOSIS — Z6839 Body mass index (BMI) 39.0-39.9, adult: Secondary | ICD-10-CM | POA: Diagnosis not present

## 2023-02-08 MED ORDER — VITAMIN D (ERGOCALCIFEROL) 1.25 MG (50000 UNIT) PO CAPS
50000.0000 [IU] | ORAL_CAPSULE | ORAL | 0 refills | Status: DC
Start: 2023-02-08 — End: 2023-02-21

## 2023-02-08 NOTE — Progress Notes (Unsigned)
Chief Complaint:   OBESITY Lauren Fox is here to discuss her progress with her obesity treatment plan along with follow-up of her obesity related diagnoses. Lauren Fox is on the Category 2 Plan + 100 calories and states she is following her eating plan approximately 100% of the time. Lauren Fox states she is walking for 15 minutes 2-3 times per week.  Today's visit was #: 3 Starting weight: 245 lbs Starting date: 01/09/2023 Today's weight: 232 lbs Today's date: 02/08/2023 Total lbs lost to date: 13 Total lbs lost since last in-office visit: 5  Interim History: Patient continues to do very well with her weight loss on her Category 2 +100-calorie plan.  She has increased her walking and she is feeling well overall.  Subjective:   1. Vitamin D deficiency Patient is on vitamin D, with no side effects noted.  2. Prediabetes Patient is working on her diet, exercise, and weight loss to improve her glucose and prevent onset of diabetes mellitus.  Assessment/Plan:   1. Vitamin D deficiency We will refill prescription vitamin D 50,000 IU once every 7 days for 1 month, and we will recheck labs in 1 to 2 months.  - Vitamin D, Ergocalciferol, (DRISDOL) 1.25 MG (50000 UNIT) CAPS capsule; Take 1 capsule (50,000 Units total) by mouth every 7 (seven) days.  Dispense: 4 capsule; Refill: 0  2. Prediabetes Patient will continue with her diet, exercise, and weight loss, and we will recheck labs in 1 to 2 months.  3. BMI 39.0-39.9,adult  4. Obesity, Beginning BMI 42.1 Lauren Fox is currently in the action stage of change. As such, her goal is to continue with weight loss efforts. She has agreed to the Category 2 Plan + 100 calories.   Exercise goals: All adults should avoid inactivity. Some physical activity is better than none, and adults who participate in any amount of physical activity gain some health benefits.  Behavioral modification strategies: increasing lean protein intake.  Lauren Fox has agreed to  follow-up with our clinic in 2 weeks. She was informed of the importance of frequent follow-up visits to maximize her success with intensive lifestyle modifications for her multiple health conditions.   Objective:   Blood pressure 118/73, pulse (!) 57, temperature 98 F (36.7 C), height 5\' 4"  (1.626 m), weight 232 lb (105.2 kg), SpO2 99%. Body mass index is 39.82 kg/m.  Lab Results  Component Value Date   CREATININE 0.88 01/09/2023   BUN 12 01/09/2023   NA 142 01/09/2023   K 4.7 01/09/2023   CL 102 01/09/2023   CO2 21 01/09/2023   Lab Results  Component Value Date   ALT 21 01/09/2023   AST 20 01/09/2023   ALKPHOS 74 01/09/2023   BILITOT 0.3 01/09/2023   Lab Results  Component Value Date   HGBA1C 5.7 (H) 01/09/2023   HGBA1C 5.9 09/21/2022   HGBA1C 5.7 09/10/2021   HGBA1C 5.7 09/10/2020   HGBA1C 5.5 04/11/2018   No results found for: "INSULIN" Lab Results  Component Value Date   TSH 4.110 01/09/2023   Lab Results  Component Value Date   CHOL 246 (H) 01/09/2023   HDL 50 01/09/2023   LDLCALC 177 (H) 01/09/2023   TRIG 109 01/09/2023   CHOLHDL 5 09/21/2022   Lab Results  Component Value Date   VD25OH 27.7 (L) 01/09/2023   VD25OH 37.11 09/21/2022   VD25OH 36.40 09/10/2021   Lab Results  Component Value Date   WBC 6.1 01/09/2023   HGB 15.4 01/09/2023  HCT 45.6 01/09/2023   MCV 93 01/09/2023   PLT 213 01/09/2023   No results found for: "IRON", "TIBC", "FERRITIN"  Attestation Statements:   Reviewed by clinician on day of visit: allergies, medications, problem list, medical history, surgical history, family history, social history, and previous encounter notes.  I have personally spent 40 minutes total time today in preparation, patient care, and documentation for this visit, including the following: review of clinical lab tests; review of medical tests/procedures/services.  I, Burt Knack, am acting as transcriptionist for Quillian Quince, MD.  I have  reviewed the above documentation for accuracy and completeness, and I agree with the above. -  Quillian Quince, MD

## 2023-02-21 ENCOUNTER — Ambulatory Visit (INDEPENDENT_AMBULATORY_CARE_PROVIDER_SITE_OTHER): Payer: 59 | Admitting: Family Medicine

## 2023-02-21 ENCOUNTER — Encounter (INDEPENDENT_AMBULATORY_CARE_PROVIDER_SITE_OTHER): Payer: Self-pay | Admitting: Family Medicine

## 2023-02-21 VITALS — BP 124/75 | HR 59 | Temp 98.2°F | Ht 64.0 in | Wt 229.0 lb

## 2023-02-21 DIAGNOSIS — E559 Vitamin D deficiency, unspecified: Secondary | ICD-10-CM | POA: Diagnosis not present

## 2023-02-21 DIAGNOSIS — E669 Obesity, unspecified: Secondary | ICD-10-CM

## 2023-02-21 DIAGNOSIS — Z6839 Body mass index (BMI) 39.0-39.9, adult: Secondary | ICD-10-CM

## 2023-02-21 MED ORDER — VITAMIN D (ERGOCALCIFEROL) 1.25 MG (50000 UNIT) PO CAPS
50000.0000 [IU] | ORAL_CAPSULE | ORAL | 0 refills | Status: DC
Start: 2023-02-21 — End: 2023-03-07

## 2023-02-21 NOTE — Progress Notes (Unsigned)
Chief Complaint:   OBESITY Lauren Fox is here to discuss her progress with her obesity treatment plan along with follow-up of her obesity related diagnoses. Lauren Fox is on the Category 2 Plan + 100 calories and states she is following her eating plan approximately 100% of the time. Lauren Fox states she is riding  the stationary bike for 10 minutes 2 times per week.  Today's visit was #: 4 Starting weight: 245 lbs Starting date: 01/09/2023 Today's weight: 229 lbs Today's date: 02/21/2023 Total lbs lost to date: 16 Total lbs lost since last in-office visit: 3  Interim History: Patient continues to do well with her weight loss.  Her hunger is mostly controlled and she is meal planning well.  Subjective:   1. Vitamin D deficiency Patient is on prescription vitamin D, and she denies nausea, vomiting, or muscle weakness.  Assessment/Plan:   1. Vitamin D deficiency Patient will continue prescription Vitamin D once weekly, and we will refill for 1 month.  - Vitamin D, Ergocalciferol, (DRISDOL) 1.25 MG (50000 UNIT) CAPS capsule; Take 1 capsule (50,000 Units total) by mouth every 7 (seven) days.  Dispense: 4 capsule; Refill: 0   2. BMI 39.0-39.9,adult  3. Obesity, Beginning BMI 42.1 Lauren Fox is currently in the action stage of change. As such, her goal is to continue with weight loss efforts. She has agreed to the Category 2 Plan + 100 calories.   Exercise goals: As is.   Behavioral modification strategies: increasing lean protein intake.  Lauren Fox has agreed to follow-up with our clinic in 2 to 3 weeks. She was informed of the importance of frequent follow-up visits to maximize her success with intensive lifestyle modifications for her multiple health conditions.   Objective:   Blood pressure 124/75, pulse (!) 59, temperature 98.2 F (36.8 C), height 5\' 4"  (1.626 m), weight 229 lb (103.9 kg), SpO2 99%. Body mass index is 39.31 kg/m.  Lab Results  Component Value Date   CREATININE 0.88  01/09/2023   BUN 12 01/09/2023   NA 142 01/09/2023   K 4.7 01/09/2023   CL 102 01/09/2023   CO2 21 01/09/2023   Lab Results  Component Value Date   ALT 21 01/09/2023   AST 20 01/09/2023   ALKPHOS 74 01/09/2023   BILITOT 0.3 01/09/2023   Lab Results  Component Value Date   HGBA1C 5.7 (H) 01/09/2023   HGBA1C 5.9 09/21/2022   HGBA1C 5.7 09/10/2021   HGBA1C 5.7 09/10/2020   HGBA1C 5.5 04/11/2018   No results found for: "INSULIN" Lab Results  Component Value Date   TSH 4.110 01/09/2023   Lab Results  Component Value Date   CHOL 246 (H) 01/09/2023   HDL 50 01/09/2023   LDLCALC 177 (H) 01/09/2023   TRIG 109 01/09/2023   CHOLHDL 5 09/21/2022   Lab Results  Component Value Date   VD25OH 27.7 (L) 01/09/2023   VD25OH 37.11 09/21/2022   VD25OH 36.40 09/10/2021   Lab Results  Component Value Date   WBC 6.1 01/09/2023   HGB 15.4 01/09/2023   HCT 45.6 01/09/2023   MCV 93 01/09/2023   PLT 213 01/09/2023   No results found for: "IRON", "TIBC", "FERRITIN"  Attestation Statements:   Reviewed by clinician on day of visit: allergies, medications, problem list, medical history, surgical history, family history, social history, and previous encounter notes.   I, Burt Knack, am acting as transcriptionist for Quillian Quince, MD.  I have reviewed the above documentation for accuracy and completeness, and  I agree with the above. -  Quillian Quince, MD

## 2023-03-07 ENCOUNTER — Encounter (INDEPENDENT_AMBULATORY_CARE_PROVIDER_SITE_OTHER): Payer: Self-pay | Admitting: Family Medicine

## 2023-03-07 ENCOUNTER — Ambulatory Visit (INDEPENDENT_AMBULATORY_CARE_PROVIDER_SITE_OTHER): Payer: 59 | Admitting: Family Medicine

## 2023-03-07 VITALS — BP 113/50 | HR 61 | Temp 97.9°F | Ht 64.0 in | Wt 227.0 lb

## 2023-03-07 DIAGNOSIS — Z6836 Body mass index (BMI) 36.0-36.9, adult: Secondary | ICD-10-CM | POA: Insufficient documentation

## 2023-03-07 DIAGNOSIS — E559 Vitamin D deficiency, unspecified: Secondary | ICD-10-CM

## 2023-03-07 DIAGNOSIS — Z6839 Body mass index (BMI) 39.0-39.9, adult: Secondary | ICD-10-CM | POA: Diagnosis not present

## 2023-03-07 DIAGNOSIS — E669 Obesity, unspecified: Secondary | ICD-10-CM

## 2023-03-07 MED ORDER — VITAMIN D (ERGOCALCIFEROL) 1.25 MG (50000 UNIT) PO CAPS
50000.0000 [IU] | ORAL_CAPSULE | ORAL | 0 refills | Status: DC
Start: 2023-03-07 — End: 2023-03-28

## 2023-03-07 NOTE — Progress Notes (Unsigned)
Chief Complaint:   OBESITY Lauren Fox is here to discuss her progress with her obesity treatment plan along with follow-up of her obesity related diagnoses. Lauren Fox is on the Category 2 Plan + 100 calories and states she is following her eating plan approximately 100% of the time. Lauren Fox states she is doing physical therapy for 60 minutes 2 times per week.  Today's visit was #: 5 Starting weight: 245 lbs Starting date: 01/09/2023 Today's weight: 227 lbs Today's date: 03/07/2023 Total lbs lost to date: 18 Total lbs lost since last in-office visit: 2  Interim History: Patient continues to work on her diet and weight loss.  She is doing well with following her Category 2 eating plan.  Her hunger is controlled.  She has questions about potatoes.  Subjective:   1. Vitamin D deficiency Patient is on vitamin D prescription, but her vitamin D level was not yet at goal.  Her fatigue is improving.  Assessment/Plan:   1. Vitamin D deficiency Patient will continue once weekly prescription vitamin D 50,000 IU every 7 days, and we will refill for 1 month.  We will recheck labs in 1 to 2 months.  - Vitamin D, Ergocalciferol, (DRISDOL) 1.25 MG (50000 UNIT) CAPS capsule; Take 1 capsule (50,000 Units total) by mouth every 7 (seven) days.  Dispense: 4 capsule; Refill: 0  2. BMI 39.0-39.9,adult  3. Obesity, Beginning BMI 42.1 Lauren Fox is currently in the action stage of change. As such, her goal is to continue with weight loss efforts. She has agreed to the Category 2 Plan.   Patient is okay to eat potatoes on special occasions, but make sure to eat protein first.  Exercise goals: As is.   Behavioral modification strategies: holiday eating strategies .  Lauren Fox has agreed to follow-up with our clinic in 4 weeks. She was informed of the importance of frequent follow-up visits to maximize her success with intensive lifestyle modifications for her multiple health conditions.   Objective:   Blood  pressure (!) 113/50, pulse 61, temperature 97.9 F (36.6 C), height 5\' 4"  (1.626 m), weight 227 lb (103 kg), SpO2 100%. Body mass index is 38.96 kg/m.  Lab Results  Component Value Date   CREATININE 0.88 01/09/2023   BUN 12 01/09/2023   NA 142 01/09/2023   K 4.7 01/09/2023   CL 102 01/09/2023   CO2 21 01/09/2023   Lab Results  Component Value Date   ALT 21 01/09/2023   AST 20 01/09/2023   ALKPHOS 74 01/09/2023   BILITOT 0.3 01/09/2023   Lab Results  Component Value Date   HGBA1C 5.7 (H) 01/09/2023   HGBA1C 5.9 09/21/2022   HGBA1C 5.7 09/10/2021   HGBA1C 5.7 09/10/2020   HGBA1C 5.5 04/11/2018   No results found for: "INSULIN" Lab Results  Component Value Date   TSH 4.110 01/09/2023   Lab Results  Component Value Date   CHOL 246 (H) 01/09/2023   HDL 50 01/09/2023   LDLCALC 177 (H) 01/09/2023   TRIG 109 01/09/2023   CHOLHDL 5 09/21/2022   Lab Results  Component Value Date   VD25OH 27.7 (L) 01/09/2023   VD25OH 37.11 09/21/2022   VD25OH 36.40 09/10/2021   Lab Results  Component Value Date   WBC 6.1 01/09/2023   HGB 15.4 01/09/2023   HCT 45.6 01/09/2023   MCV 93 01/09/2023   PLT 213 01/09/2023   No results found for: "IRON", "TIBC", "FERRITIN"  Attestation Statements:   Reviewed by clinician on day  of visit: allergies, medications, problem list, medical history, surgical history, family history, social history, and previous encounter notes.  Time spent on visit including pre-visit chart review and post-visit care and charting was 30 minutes.   I, Burt Knack, am acting as transcriptionist for Quillian Quince, MD.  I have reviewed the above documentation for accuracy and completeness, and I agree with the above. -  Quillian Quince, MD

## 2023-03-28 ENCOUNTER — Ambulatory Visit (INDEPENDENT_AMBULATORY_CARE_PROVIDER_SITE_OTHER): Payer: 59 | Admitting: Family Medicine

## 2023-03-28 ENCOUNTER — Encounter (INDEPENDENT_AMBULATORY_CARE_PROVIDER_SITE_OTHER): Payer: Self-pay | Admitting: Family Medicine

## 2023-03-28 VITALS — BP 114/58 | HR 61 | Temp 97.9°F | Ht 64.0 in | Wt 220.0 lb

## 2023-03-28 DIAGNOSIS — E559 Vitamin D deficiency, unspecified: Secondary | ICD-10-CM

## 2023-03-28 DIAGNOSIS — Z6837 Body mass index (BMI) 37.0-37.9, adult: Secondary | ICD-10-CM | POA: Diagnosis not present

## 2023-03-28 DIAGNOSIS — E669 Obesity, unspecified: Secondary | ICD-10-CM

## 2023-03-28 MED ORDER — VITAMIN D (ERGOCALCIFEROL) 1.25 MG (50000 UNIT) PO CAPS: 50000.0000 [IU] | ORAL_CAPSULE | ORAL | 0 refills | Status: DC

## 2023-03-28 NOTE — Progress Notes (Signed)
.smr  Office: [phone]  /  Fax: [phone]  WEIGHT SUMMARY AND BIOMETRICS  Anthropometric Measurements Height: 5\' 4"  (1.626 m) Weight: 220 lb (99.8 kg) BMI (Calculated): 37.74 Weight at Last Visit: 227 lb Weight Lost Since Last Visit: 7 lb Weight Gained Since Last Visit: 0 Starting Weight: 245 lb Total Weight Loss (lbs): 25 lb (11.3 kg) Peak Weight: 245 lb   Body Composition  Body Fat %: 42.7 % Fat Mass (lbs): 94 lbs Muscle Mass (lbs): 119.6 lbs Total Body Water (lbs): 82.8 lbs Visceral Fat Rating : 12   Other Clinical Data Fasting: No Labs: No Today's Visit #: 6 Starting Date: 01/09/23    Chief Complaint: OBESITY   History of Present Illness   The patient, diagnosed with obesity and vitamin D deficiency, has been adhering to a category two eating plan and has lost seven pounds in the past month. She has been taking prescription vitamin D at a dose of fifty thousand international units weekly. The patient has been engaging in physical activity, specifically riding a stationary bike for ten minutes one to two times per week. She reports feeling good after these exercise sessions and plans to gradually increase the duration.  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 She is currently employed at [employer] and is exploring other job opportunities within [employer].  The patient has been experiencing a sinus infection, from which she is currently recovering. She has been managing her meals well, alternating between sandwiches and microwave meals. However, she expresses a desire for more variety in her microwave meals, as she is finding it challenging to find options that align with her dietary plan.  In preparation for the upcoming holiday season, the patient is strategizing on how to maintain her eating plan during Thanksgiving. She expresses concern about the potential challenges of not overeating  during the holiday.          PHYSICAL EXAM:  Blood pressure (!) 114/58, pulse 61, temperature 97.9 F (36.6 C), height 5\' 4"  (1.626 m), weight 220 lb (99.8 kg), SpO2 99%. Body mass index is 37.76 kg/m.  DIAGNOSTIC DATA REVIEWED:  BMET    Component Value Date/Time   NA 142 01/09/2023 1307   K 4.7 01/09/2023 1307   CL 102 01/09/2023 1307   CO2 21 01/09/2023 1307   GLUCOSE 82 01/09/2023 1307   GLUCOSE 76 09/21/2022 1054   BUN 12 01/09/2023 1307   CREATININE 0.88 01/09/2023 1307   CALCIUM 10.1 01/09/2023 1307   GFRNONAA 73.28 06/19/2009 1129   Lab Results  Component Value Date   HGBA1C 5.7 (H) 01/09/2023   HGBA1C 5.5 11/25/2008   No results found for: "INSULIN" Lab Results  Component Value Date   TSH 4.110 01/09/2023   CBC    Component Value Date/Time   WBC 6.1 01/09/2023 1307   WBC 6.5 09/21/2022 1054   RBC 4.91 01/09/2023 1307   RBC 4.78 09/21/2022 1054   HGB 15.4 01/09/2023 1307   HCT 45.6 01/09/2023 1307   PLT 213 01/09/2023 1307   MCV 93 01/09/2023 1307   MCH 31.4 01/09/2023 1307   MCH 30.6 10/24/2014 0150   MCHC 33.8 01/09/2023 1307   MCHC 34.1 09/21/2022 1054   RDW 12.8 01/09/2023 1307   Iron Studies No results found for: "IRON", "TIBC", "FERRITIN", "IRONPCTSAT" Lipid Panel     Component Value Date/Time   CHOL 246 (H) 01/09/2023 1307   TRIG 109  01/09/2023 1307   HDL 50 01/09/2023 1307   CHOLHDL 5 09/21/2022 1054   VLDL 15.8 09/21/2022 1054   LDLCALC 177 (H) 01/09/2023 1307   Hepatic Function Panel     Component Value Date/Time   PROT 7.5 01/09/2023 1307   ALBUMIN 5.0 (H) 01/09/2023 1307   AST 20 01/09/2023 1307   ALT 21 01/09/2023 1307   ALKPHOS 74 01/09/2023 1307   BILITOT 0.3 01/09/2023 1307   BILIDIR 0.1 04/11/2018 1530      Component Value Date/Time   TSH 4.110 01/09/2023 1307   Nutritional Lab Results  Component Value Date   VD25OH 27.7 (L) 01/09/2023   VD25OH 37.11 09/21/2022   VD25OH 36.40 09/10/2021     Assessment  and Plan    Obesity Obesity with recent weight loss of seven pounds over the past month. Adhering to a category two eating plan and engaging in regular exercise. Discussed increasing exercise frequency for better health benefits and weight loss. Provided outcome statistics showing smaller, more frequent portions can reduce caloric intake by up to 40%. - Continue category two eating plan - Increase stationary bike exercise to three to four times per week for ten minutes each session - Provide tips for managing eating during Thanksgiving, including portion control and savoring food - Discuss strategies for maintaining healthy eating habits post-Thanksgiving - Schedule follow-up appointment in January for fasting blood work  Vitamin D Deficiency Vitamin D deficiency managed with prescription vitamin D 50,000 IU weekly. - Send prescription for vitamin D 50,000 IU weekly to [facility] pharmacy on [facility] Maintenance Discussed meal planning and exercise. Provided additional lunch options to prevent dietary monotony. - Provide additional lunch options to replace sandwiches and microwave meals - Schedule follow-up appointment on May 02, 2023, at 3:40 PM - Schedule January appointment for fasting blood work.        She was informed of the importance of frequent follow up visits to maximize her success with intensive lifestyle modifications for her multiple health conditions.    Quillian Quince, MD

## 2023-05-02 ENCOUNTER — Ambulatory Visit (INDEPENDENT_AMBULATORY_CARE_PROVIDER_SITE_OTHER): Payer: 59 | Admitting: Family Medicine

## 2023-05-02 ENCOUNTER — Encounter (INDEPENDENT_AMBULATORY_CARE_PROVIDER_SITE_OTHER): Payer: Self-pay | Admitting: Family Medicine

## 2023-05-02 VITALS — BP 127/82 | HR 61 | Temp 97.9°F | Ht 64.0 in | Wt 218.0 lb

## 2023-05-02 DIAGNOSIS — N951 Menopausal and female climacteric states: Secondary | ICD-10-CM

## 2023-05-02 DIAGNOSIS — E559 Vitamin D deficiency, unspecified: Secondary | ICD-10-CM | POA: Diagnosis not present

## 2023-05-02 DIAGNOSIS — Z6837 Body mass index (BMI) 37.0-37.9, adult: Secondary | ICD-10-CM

## 2023-05-02 DIAGNOSIS — E669 Obesity, unspecified: Secondary | ICD-10-CM

## 2023-05-02 MED ORDER — VITAMIN D (ERGOCALCIFEROL) 1.25 MG (50000 UNIT) PO CAPS: 50000.0000 [IU] | ORAL_CAPSULE | ORAL | 0 refills | Status: DC

## 2023-05-02 NOTE — Progress Notes (Signed)
.smr  Office: [phone]  /  Fax: [phone]  WEIGHT SUMMARY AND BIOMETRICS  Anthropometric Measurements Height: 5\' 4"  (1.626 m) Weight: 218 lb (98.9 kg) BMI (Calculated): 37.4 Weight at Last Visit: 220lb Weight Lost Since Last Visit: 2lb Weight Gained Since Last Visit: 0lb Starting Weight: 245lb Total Weight Loss (lbs): 27 lb (12.2 kg) Peak Weight: 245lb   Body Composition  Body Fat %: 42.1 % Fat Mass (lbs): 92 lbs Muscle Mass (lbs): 120 lbs Total Body Water (lbs): 82.8 lbs Visceral Fat Rating : 12   Other Clinical Data Fasting: No Labs: No Today's Visit #: 7 Starting Date: 01/09/23    Chief Complaint: OBESITY   History of Present Illness   The patient, diagnosed with vitamin D deficiency and obesity, presents for a follow-up consultation. She has been adhering to a category two eating plan and has lost two pounds in the past month. Despite this progress, the patient's vitamin D levels remain below the target, with the most recent reading at 27.7 in August.  The patient has been managing her obesity through dietary changes and regular exercise, using an exercise bike for ten minutes three times a week. She has also recently purchased a stepper to incorporate into her routine.  In addition to her primary health concerns, the patient has been experiencing menopausal symptoms, including hot flashes and night sweats. She has noticed a correlation between these symptoms and the consumption of onions, which she has been trying to limit. To manage these symptoms, the patient has been taking a plant-based soy supplement called Equelle.  The patient is also dealing with the emotional aftermath of her mother's passing due to lung cancer last year. This has been particularly challenging during the holiday season, a time she used to spend shopping and decorating with her mother. Despite these challenges, the patient remains committed to her health and wellness journey.           PHYSICAL EXAM:  Blood pressure 127/82, pulse 61, temperature 97.9 F (36.6 C), height 5\' 4"  (1.626 m), weight 218 lb (98.9 kg), SpO2 99%. Body mass index is 37.42 kg/m.  DIAGNOSTIC DATA REVIEWED:  BMET    Component Value Date/Time   NA 142 01/09/2023 1307   K 4.7 01/09/2023 1307   CL 102 01/09/2023 1307   CO2 21 01/09/2023 1307   GLUCOSE 82 01/09/2023 1307   GLUCOSE 76 09/21/2022 1054   BUN 12 01/09/2023 1307   CREATININE 0.88 01/09/2023 1307   CALCIUM 10.1 01/09/2023 1307   GFRNONAA 73.28 06/19/2009 1129   Lab Results  Component Value Date   HGBA1C 5.7 (H) 01/09/2023   HGBA1C 5.5 11/25/2008   No results found for: "INSULIN" Lab Results  Component Value Date   TSH 4.110 01/09/2023   CBC    Component Value Date/Time   WBC 6.1 01/09/2023 1307   WBC 6.5 09/21/2022 1054   RBC 4.91 01/09/2023 1307   RBC 4.78 09/21/2022 1054   HGB 15.4 01/09/2023 1307   HCT 45.6 01/09/2023 1307   PLT 213 01/09/2023 1307   MCV 93 01/09/2023 1307   MCH 31.4 01/09/2023 1307   MCH 30.6 10/24/2014 0150   MCHC 33.8 01/09/2023 1307   MCHC 34.1 09/21/2022 1054   RDW 12.8 01/09/2023 1307   Iron Studies No results found for: "IRON", "TIBC", "FERRITIN", "IRONPCTSAT" Lipid Panel     Component Value Date/Time   CHOL 246 (H) 01/09/2023 1307   TRIG 109 01/09/2023 1307   HDL 50 01/09/2023 1307  CHOLHDL 5 09/21/2022 1054   VLDL 15.8 09/21/2022 1054   LDLCALC 177 (H) 01/09/2023 1307   Hepatic Function Panel     Component Value Date/Time   PROT 7.5 01/09/2023 1307   ALBUMIN 5.0 (H) 01/09/2023 1307   AST 20 01/09/2023 1307   ALT 21 01/09/2023 1307   ALKPHOS 74 01/09/2023 1307   BILITOT 0.3 01/09/2023 1307   BILIDIR 0.1 04/11/2018 1530      Component Value Date/Time   TSH 4.110 01/09/2023 1307   Nutritional Lab Results  Component Value Date   VD25OH 27.7 (L) 01/09/2023   VD25OH 37.11 09/21/2022   VD25OH 36.40 09/10/2021     Assessment and Plan    Vitamin D  Deficiency Chronic vitamin D deficiency with a recent level of 27.7 ng/mL, below the goal of 50 ng/mL. Currently on prescription vitamin D 50,000 IU weekly. Discussed the importance of maintaining adequate vitamin D levels for bone health and overall well-being. Risks of deficiency include bone pain and muscle weakness. Benefits of supplementation include improved bone density and reduced risk of fractures. - Refill prescription vitamin D 50,000 IU weekly - Plan for labs in January to recheck vitamin D levels  Obesity Following a category two eating plan and exercising regularly. Lost 2 pounds in the last month, even over Thanksgiving. Incorporating a stepper into routine. Discussed the benefits of weight loss including reduced risk of cardiovascular disease, diabetes, and improved mobility. Encouraged gradual increase in exercise duration and intensity to avoid injury and promote sustainable weight loss. - Continue category two eating plan - Continue exercise regimen with exercise bike and stepper - Encourage gradual increase in exercise duration and intensity  Menopausal Symptoms Reports hot flashes and night sweats, exacerbated by onions. Currently taking Equelle, a plant-based soy supplement, which has been helpful in managing symptoms. Discussed potential risks of soy-based supplements, including the theoretical risk of mimicking estrogen's adverse effects such as breast cancer. Emphasized the importance of regular mammograms to monitor for any potential issues. - Continue Equelle supplement - Monitor for any worsening of symptoms - Discuss potential medication options if symptoms become unmanageable  General Health Maintenance Discussed the need for regular mammograms to detect any early signs of breast cancer, especially given the use of a soy-based supplement that mimics estrogen. - Ensure regular mammograms are up to date  Follow-up - Schedule follow-up appointment in February to  review lab results and ongoing management.        She was informed of the importance of frequent follow up visits to maximize her success with intensive lifestyle modifications for her multiple health conditions.    Quillian Quince, MD

## 2023-05-30 ENCOUNTER — Encounter (INDEPENDENT_AMBULATORY_CARE_PROVIDER_SITE_OTHER): Payer: Self-pay | Admitting: Family Medicine

## 2023-05-30 ENCOUNTER — Ambulatory Visit (INDEPENDENT_AMBULATORY_CARE_PROVIDER_SITE_OTHER): Payer: 59 | Admitting: Family Medicine

## 2023-05-30 VITALS — BP 141/87 | HR 56 | Temp 97.7°F | Ht 64.0 in | Wt 213.0 lb

## 2023-05-30 DIAGNOSIS — R7303 Prediabetes: Secondary | ICD-10-CM

## 2023-05-30 DIAGNOSIS — E559 Vitamin D deficiency, unspecified: Secondary | ICD-10-CM

## 2023-05-30 DIAGNOSIS — E782 Mixed hyperlipidemia: Secondary | ICD-10-CM

## 2023-05-30 DIAGNOSIS — Z6836 Body mass index (BMI) 36.0-36.9, adult: Secondary | ICD-10-CM

## 2023-05-30 DIAGNOSIS — E669 Obesity, unspecified: Secondary | ICD-10-CM

## 2023-05-30 DIAGNOSIS — E785 Hyperlipidemia, unspecified: Secondary | ICD-10-CM | POA: Diagnosis not present

## 2023-05-30 DIAGNOSIS — R030 Elevated blood-pressure reading, without diagnosis of hypertension: Secondary | ICD-10-CM | POA: Diagnosis not present

## 2023-05-30 MED ORDER — VITAMIN D (ERGOCALCIFEROL) 1.25 MG (50000 UNIT) PO CAPS: 50000.0000 [IU] | ORAL_CAPSULE | ORAL | 0 refills | Status: DC

## 2023-05-30 NOTE — Progress Notes (Signed)
 .smr  Office: [phone]  /  Fax: [phone]  WEIGHT SUMMARY AND BIOMETRICS  Anthropometric Measurements Height: 5' 4 (1.626 m) Weight: 213 lb (96.6 kg) BMI (Calculated): 36.54 Weight at Last Visit: 218 lb Weight Lost Since Last Visit: 5 lb Weight Gained Since Last Visit: 0 Starting Weight: 245 lb Total Weight Loss (lbs): 32 lb (14.5 kg) Peak Weight: 245 lb   Body Composition  Body Fat %: 41.5 % Fat Mass (lbs): 88.6 lbs Muscle Mass (lbs): 118.6 lbs Total Body Water (lbs): 77.6 lbs Visceral Fat Rating : 12   Other Clinical Data Fasting: No Labs: Yes Today's Visit #: 8 Starting Date: 01/09/23    Chief Complaint: OBESITY  History of Present Illness   The patient, with a history of mixed hyperlipidemia, vitamin D  deficiency, obesity, and prediabetes, presents for a follow-up visit. She has been adhering to a category two eating plan, resulting in a weight loss of five pounds over the past month, including the holiday season. She has been engaging in regular exercise, primarily using a bike and stepper for about ten minutes most days of the week.  Despite these positive lifestyle changes, the patient's most recent LDL level was elevated at 822, and she is not currently on a statin. She has been managing her vitamin D  deficiency with a weekly dose of 50,000 international units of prescription vitamin D . Her most recent vitamin D  level, taken over the summer, was low at 27.7.  The patient's blood pressure was unusually elevated at the time of the visit, with readings of 154/81 and 141/87. This is atypical for the patient, who usually has blood pressures within normal limits, and occasionally on the lower side. She does not have a history of hypertension.  The patient has been experiencing success with her weight loss efforts and is motivated to continue due to dissatisfaction with her physical appearance and a diagnosis of prediabetes. She has been diligent with meal planning  and preparation, which she attributes to her weight loss success. 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          PHYSICAL EXAM:  Blood pressure (!) 141/87, pulse (!) 56, temperature 97.7 F (36.5 C), height 5' 4 (1.626 m), weight 213 lb (96.6 kg), SpO2 99%. Body mass index is 36.56 kg/m.  DIAGNOSTIC DATA REVIEWED:  BMET    Component Value Date/Time   NA 142 01/09/2023 1307   K 4.7 01/09/2023 1307   CL 102 01/09/2023 1307   CO2 21 01/09/2023 1307   GLUCOSE 82 01/09/2023 1307   GLUCOSE 76 09/21/2022 1054   BUN 12 01/09/2023 1307   CREATININE 0.88 01/09/2023 1307   CALCIUM  10.1 01/09/2023 1307   GFRNONAA 73.28 06/19/2009 1129   Lab Results  Component Value Date   HGBA1C 5.7 (H) 01/09/2023   HGBA1C 5.5 11/25/2008   No results found for: INSULIN  Lab Results  Component Value Date   TSH 4.110 01/09/2023   CBC    Component Value Date/Time   WBC 6.1 01/09/2023 1307   WBC 6.5 09/21/2022 1054   RBC 4.91 01/09/2023 1307   RBC 4.78 09/21/2022 1054   HGB 15.4 01/09/2023 1307   HCT 45.6 01/09/2023 1307   PLT 213 01/09/2023 1307   MCV 93 01/09/2023 1307   MCH 31.4 01/09/2023 1307   MCH 30.6 10/24/2014 0150   MCHC 33.8 01/09/2023 1307   MCHC 34.1 09/21/2022 1054   RDW 12.8 01/09/2023 1307   Iron Studies No results found  for: IRON, TIBC, FERRITIN, IRONPCTSAT Lipid Panel     Component Value Date/Time   CHOL 246 (H) 01/09/2023 1307   TRIG 109 01/09/2023 1307   HDL 50 01/09/2023 1307   CHOLHDL 5 09/21/2022 1054   VLDL 15.8 09/21/2022 1054   LDLCALC 177 (H) 01/09/2023 1307   Hepatic Function Panel     Component Value Date/Time   PROT 7.5 01/09/2023 1307   ALBUMIN 5.0 (H) 01/09/2023 1307   AST 20 01/09/2023 1307   ALT 21 01/09/2023 1307   ALKPHOS 74 01/09/2023 1307   BILITOT 0.3 01/09/2023 1307   BILIDIR 0.1 04/11/2018 1530      Component Value Date/Time   TSH 4.110 01/09/2023 1307   Nutritional Lab Results  Component  Value Date   VD25OH 27.7 (L) 01/09/2023   VD25OH 37.11 09/21/2022   VD25OH 36.40 09/10/2021     Assessment and Plan    Hyperlipidemia LDL at 177, not on statin. Actively working on diet, exercise, and weight loss. Recent 5-pound weight loss. Discussed lifestyle changes and potential statin therapy if lipid levels remain elevated. - Order lipid panel - Continue diet and exercise regimen - Consider statin therapy if lipid levels remain elevated  Elevated Blood Pressure Blood pressure elevated at 154/81 and 141/87 on repeat. No history of hypertension. Discussed home monitoring to identify trends. - Monitor blood pressure at home - Reassess blood pressure at next visit - Consider further evaluation if elevated readings persist  Prediabetes Actively managing with diet and exercise. Discussed regular monitoring and lifestyle changes to prevent progression to diabetes. - Order fasting blood glucose and HbA1c - Continue current diet and exercise regimen - Reassess blood glucose levels at next visit  Obesity Following category two eating plan, lost 5 pounds in the last month. Engaging in regular exercise. Discussed sustainable lifestyle changes. - Continue current diet and exercise regimen Category 2 - Encourage further weight loss and healthy lifestyle changes  Vitamin D  Deficiency On prescription vitamin D  50,000 IU weekly. Recent level 27.7. Discussed monitoring to avoid toxicity and potential nerve issues from high levels. - Refill vitamin D  50,000 IU weekly - Order vitamin D  level - Monitor vitamin D  levels and adjust dosage if necessary  General Health Maintenance Routine health maintenance including lab work. - Order labs: lipid panel, fasting blood glucose, HbA1c, vitamin D , B12, kidney function, liver function, electrolytes          She was informed of the importance of frequent follow up visits to maximize her success with intensive lifestyle modifications for her  multiple health conditions.    Louann Penton, MD

## 2023-05-31 LAB — CMP14+EGFR
ALT: 20 [IU]/L (ref 0–32)
AST: 18 [IU]/L (ref 0–40)
Albumin: 4.9 g/dL (ref 3.8–4.9)
Alkaline Phosphatase: 78 [IU]/L (ref 44–121)
BUN/Creatinine Ratio: 16 (ref 9–23)
BUN: 17 mg/dL (ref 6–24)
Bilirubin Total: 0.3 mg/dL (ref 0.0–1.2)
CO2: 23 mmol/L (ref 20–29)
Calcium: 9.9 mg/dL (ref 8.7–10.2)
Chloride: 104 mmol/L (ref 96–106)
Creatinine, Ser: 1.07 mg/dL — ABNORMAL HIGH (ref 0.57–1.00)
Globulin, Total: 2.5 g/dL (ref 1.5–4.5)
Glucose: 85 mg/dL (ref 70–99)
Potassium: 5.2 mmol/L (ref 3.5–5.2)
Sodium: 142 mmol/L (ref 134–144)
Total Protein: 7.4 g/dL (ref 6.0–8.5)
eGFR: 61 mL/min/{1.73_m2} (ref >59)

## 2023-05-31 LAB — HEMOGLOBIN A1C
Est. average glucose Bld gHb Est-mCnc: 111 mg/dL
Hgb A1c MFr Bld: 5.5 % (ref 4.8–5.6)

## 2023-05-31 LAB — LIPID PANEL WITH LDL/HDL RATIO
Cholesterol, Total: 226 mg/dL — ABNORMAL HIGH (ref 100–199)
HDL: 49 mg/dL (ref >39)
LDL Chol Calc (NIH): 158 mg/dL — ABNORMAL HIGH (ref 0–99)
LDL/HDL Ratio: 3.2 {ratio} (ref 0.0–3.2)
Triglycerides: 106 mg/dL (ref 0–149)
VLDL Cholesterol Cal: 19 mg/dL (ref 5–40)

## 2023-05-31 LAB — INSULIN, RANDOM: INSULIN: 18 u[IU]/mL (ref 2.6–24.9)

## 2023-05-31 LAB — VITAMIN B12: Vitamin B-12: 705 pg/mL (ref 232–1245)

## 2023-05-31 LAB — VITAMIN D 25 HYDROXY (VIT D DEFICIENCY, FRACTURES): Vit D, 25-Hydroxy: 46.5 ng/mL (ref 30.0–100.0)

## 2023-06-27 ENCOUNTER — Encounter (INDEPENDENT_AMBULATORY_CARE_PROVIDER_SITE_OTHER): Payer: Self-pay | Admitting: Family Medicine

## 2023-06-27 ENCOUNTER — Ambulatory Visit (INDEPENDENT_AMBULATORY_CARE_PROVIDER_SITE_OTHER): Payer: 59 | Admitting: Family Medicine

## 2023-06-27 VITALS — BP 124/79 | HR 79 | Temp 97.9°F | Ht 64.0 in | Wt 214.0 lb

## 2023-06-27 DIAGNOSIS — Z6836 Body mass index (BMI) 36.0-36.9, adult: Secondary | ICD-10-CM

## 2023-06-27 DIAGNOSIS — E7800 Pure hypercholesterolemia, unspecified: Secondary | ICD-10-CM

## 2023-06-27 DIAGNOSIS — E669 Obesity, unspecified: Secondary | ICD-10-CM | POA: Diagnosis not present

## 2023-06-27 DIAGNOSIS — E785 Hyperlipidemia, unspecified: Secondary | ICD-10-CM | POA: Diagnosis not present

## 2023-06-27 DIAGNOSIS — E559 Vitamin D deficiency, unspecified: Secondary | ICD-10-CM | POA: Diagnosis not present

## 2023-06-27 DIAGNOSIS — R7303 Prediabetes: Secondary | ICD-10-CM

## 2023-06-27 NOTE — Progress Notes (Signed)
.smr  Office: [phone]  /  Fax: [phone]  WEIGHT SUMMARY AND BIOMETRICS  Anthropometric Measurements Height: 5\' 4"  (1.626 m) Weight: 214 lb (97.1 kg) BMI (Calculated): 36.72 Weight at Last Visit: 213 lb Weight Lost Since Last Visit: 0 Weight Gained Since Last Visit: 1 lb Starting Weight: 245 lb Total Weight Loss (lbs): 31 lb (14.1 kg) Peak Weight: 245 lb   Body Composition  Body Fat %: 42.3 % Fat Mass (lbs): 90.6 lbs Muscle Mass (lbs): 117.6 lbs Total Body Water (lbs): 81.2 lbs Visceral Fat Rating : 12   Other Clinical Data Fasting: No Labs: No Today's Visit #: 9 Starting Date: 01/09/23    Chief Complaint: OBESITY    History of Present Illness   Lauren Fox is a 58 year old female who presents for weight management.  She is focused on managing her weight, with a current BMI of 36.8, and has gained one pound in the last month despite efforts to control her weight. She is following a category two dietary plan, which she finds easy to adhere to, and is interested in adding more variety to her meals, particularly at dinner. Her nutritional goals include maintaining a calorie range of 400 to 550 calories per meal and ensuring a minimum of 35 grams of protein per meal.  She is attempting to increase her exercise to fifteen minutes three times a week using a bike and a stepper. 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  Recent lab results show an improvement in her A1c from 5.7 to 5.5 and a reduction in LDL cholesterol from 177 to 158. She continues to take prescription vitamin D, which has increased her levels from 27 to 46, with a goal of reaching the 50s or 60s.          PHYSICAL EXAM:  Blood pressure 124/79, pulse 79, temperature 97.9 F (36.6 C), height 5\' 4"  (1.626 m), weight 214 lb (97.1 kg), SpO2 99%. Body mass index is 36.73 kg/m.  DIAGNOSTIC DATA  REVIEWED:  BMET    Component Value Date/Time   NA 142 05/30/2023 0802   K 5.2 05/30/2023 0802   CL 104 05/30/2023 0802   CO2 23 05/30/2023 0802   GLUCOSE 85 05/30/2023 0802   GLUCOSE 76 09/21/2022 1054   BUN 17 05/30/2023 0802   CREATININE 1.07 (H) 05/30/2023 0802   CALCIUM 9.9 05/30/2023 0802   GFRNONAA 73.28 06/19/2009 1129   Lab Results  Component Value Date   HGBA1C 5.5 05/30/2023   HGBA1C 5.5 11/25/2008   Lab Results  Component Value Date   INSULIN 18.0 05/30/2023   Lab Results  Component Value Date   TSH 4.110 01/09/2023   CBC    Component Value Date/Time   WBC 6.1 01/09/2023 1307   WBC 6.5 09/21/2022 1054   RBC 4.91 01/09/2023 1307   RBC 4.78 09/21/2022 1054   HGB 15.4 01/09/2023 1307   HCT 45.6 01/09/2023 1307   PLT 213 01/09/2023 1307   MCV 93 01/09/2023 1307   MCH 31.4 01/09/2023 1307   MCH 30.6 10/24/2014 0150   MCHC 33.8 01/09/2023 1307   MCHC 34.1 09/21/2022 1054   RDW 12.8 01/09/2023 1307   Iron Studies No results found for: "IRON", "TIBC", "FERRITIN", "IRONPCTSAT" Lipid Panel     Component Value Date/Time   CHOL 226 (H) 05/30/2023 0802   TRIG 106 05/30/2023 0802   HDL 49 05/30/2023  0802   CHOLHDL 5 09/21/2022 1054   VLDL 15.8 09/21/2022 1054   LDLCALC 158 (H) 05/30/2023 0802   Hepatic Function Panel     Component Value Date/Time   PROT 7.4 05/30/2023 0802   ALBUMIN 4.9 05/30/2023 0802   AST 18 05/30/2023 0802   ALT 20 05/30/2023 0802   ALKPHOS 78 05/30/2023 0802   BILITOT 0.3 05/30/2023 0802   BILIDIR 0.1 04/11/2018 1530      Component Value Date/Time   TSH 4.110 01/09/2023 1307   Nutritional Lab Results  Component Value Date   VD25OH 46.5 05/30/2023   VD25OH 27.7 (L) 01/09/2023   VD25OH 37.11 09/21/2022     Assessment and Plan    Obesity BMI is 36.8. Gained one pound in the last month despite following a category two diet plan and increasing exercise to 15 minutes three times a week using the bike and stepper. Hunger  levels unchanged. Discussed maintaining a calorie range of 400-550 calories for dinner and ensuring a minimum of 35 grams of protein per meal. Provided recipes and tips to meet nutritional goals and suggested using apps like My Fitness Pal to track nutrition. Discussed adding more variety to the current plan, especially for dinner. - Provide recipes that meet nutritional goals. - Recommend using My Fitness Pal or similar apps to track nutrition. - Adjust the category two plan to include more variety, especially for dinner. - Continue current exercise regimen.  Prediabetes A1c improved from 5.7 to 5.5, indicating positive progress. Emphasized continuing dietary and exercise efforts. - Continue dietary modifications as discussed. - Maintain current exercise regimen.  Hyperlipidemia LDL cholesterol improved from 177 to 158. Goal is to reduce LDL to under 100. Emphasized continuing dietary and exercise efforts. - Continue dietary modifications as discussed. - Maintain current exercise regimen.  Vitamin D Deficiency Vitamin D levels improved from 27 to 46. Goal is to reach levels in the 50s or 60s. Currently on prescription vitamin D. Discussed the possibility of switching to over-the-counter vitamin D in the future, depending on preference and ability to maintain levels. Will reassess in the spring. - Refill prescription vitamin D. - Recheck vitamin D levels in the spring.  Follow-up - Schedule follow-up appointment in April.        She was informed of the importance of frequent follow up visits to maximize her success with intensive lifestyle modifications for her multiple health conditions.    Quillian Quince, MD

## 2023-07-04 ENCOUNTER — Other Ambulatory Visit (INDEPENDENT_AMBULATORY_CARE_PROVIDER_SITE_OTHER): Payer: Self-pay | Admitting: Family Medicine

## 2023-07-04 DIAGNOSIS — E559 Vitamin D deficiency, unspecified: Secondary | ICD-10-CM

## 2023-07-25 ENCOUNTER — Ambulatory Visit (INDEPENDENT_AMBULATORY_CARE_PROVIDER_SITE_OTHER): Payer: 59 | Admitting: Family Medicine

## 2023-07-25 ENCOUNTER — Encounter (INDEPENDENT_AMBULATORY_CARE_PROVIDER_SITE_OTHER): Payer: Self-pay | Admitting: Family Medicine

## 2023-07-25 DIAGNOSIS — E669 Obesity, unspecified: Secondary | ICD-10-CM | POA: Diagnosis not present

## 2023-07-25 DIAGNOSIS — E559 Vitamin D deficiency, unspecified: Secondary | ICD-10-CM

## 2023-07-25 DIAGNOSIS — Z719 Counseling, unspecified: Secondary | ICD-10-CM

## 2023-07-25 DIAGNOSIS — Z6836 Body mass index (BMI) 36.0-36.9, adult: Secondary | ICD-10-CM | POA: Diagnosis not present

## 2023-07-25 MED ORDER — VITAMIN D (ERGOCALCIFEROL) 1.25 MG (50000 UNIT) PO CAPS: 50000.0000 [IU] | ORAL_CAPSULE | ORAL | 0 refills | Status: DC

## 2023-07-25 NOTE — Progress Notes (Signed)
 Office: [phone]  /  Fax: [phone]  WEIGHT SUMMARY AND BIOMETRICS  Anthropometric Measurements Height: 5\' 4"  (1.626 m) Weight: 213 lb (96.6 kg) BMI (Calculated): 36.54 Weight at Last Visit: 214 lb Weight Lost Since Last Visit: 1 lb Weight Gained Since Last Visit: 0 Starting Weight: 245 lb Total Weight Loss (lbs): 32 lb (14.5 kg) Peak Weight: 245 lb   Body Composition  Body Fat %: 40.9 % Fat Mass (lbs): 87.2 lbs Muscle Mass (lbs): 119.8 lbs Total Body Water (lbs): 80.4 lbs Visceral Fat Rating : 12   Other Clinical Data Fasting: no Labs: no Today's Visit #: 10 Starting Date: 01/09/23    Chief Complaint: OBESITY   History of Present Illness   The patient presents for obesity treatment plan and progress monitoring.  She has been adhering to her category two eating plan nearly 100% of the time and has incorporated walking into her routine, approximately 15 minutes, three times a week. She has lost one pound over the past month.  She discusses her dietary habits, noting a particular weakness for potatoes, which she consumes in moderation. She experiences digestive discomfort with certain vegetables, particularly broccoli, which she loves. She suspects it might be due to insufficient chewing or the quantity of vegetables consumed. She is considering switching to other vegetables like green beans and peas, which she tolerates well.  She has a history of vitamin D deficiency and was previously on prescription vitamin D. She requests a refill as she encountered issues with her pharmacy not having it ready. She typically receives notifications from [facility] when her prescriptions are ready, but this did not occur recently.  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 She plans to continue her studies in medical terminology and medical insurance and billing in the fall, with a break during the summer.           PHYSICAL EXAM:  Blood pressure 126/80, pulse 60, temperature 98 F (36.7 C), height 5\' 4"  (1.626 m), weight 213 lb (96.6 kg), SpO2 98%. Body mass index is 36.56 kg/m.  DIAGNOSTIC DATA REVIEWED:  BMET    Component Value Date/Time   NA 142 05/30/2023 0802   K 5.2 05/30/2023 0802   CL 104 05/30/2023 0802   CO2 23 05/30/2023 0802   GLUCOSE 85 05/30/2023 0802   GLUCOSE 76 09/21/2022 1054   BUN 17 05/30/2023 0802   CREATININE 1.07 (H) 05/30/2023 0802   CALCIUM 9.9 05/30/2023 0802   GFRNONAA 73.28 06/19/2009 1129   Lab Results  Component Value Date   HGBA1C 5.5 05/30/2023   HGBA1C 5.5 11/25/2008   Lab Results  Component Value Date   INSULIN 18.0 05/30/2023   Lab Results  Component Value Date   TSH 4.110 01/09/2023   CBC    Component Value Date/Time   WBC 6.1 01/09/2023 1307   WBC 6.5 09/21/2022 1054   RBC 4.91 01/09/2023 1307   RBC 4.78 09/21/2022 1054   HGB 15.4 01/09/2023 1307   HCT 45.6 01/09/2023 1307   PLT 213 01/09/2023 1307   MCV 93 01/09/2023 1307   MCH 31.4 01/09/2023 1307   MCH 30.6 10/24/2014 0150   MCHC 33.8 01/09/2023 1307   MCHC 34.1 09/21/2022 1054   RDW 12.8 01/09/2023 1307   Iron Studies No results found for: "IRON", "TIBC", "FERRITIN", "IRONPCTSAT" Lipid Panel     Component Value Date/Time   CHOL 226 (H) 05/30/2023 0802   TRIG 106  05/30/2023 0802   HDL 49 05/30/2023 0802   CHOLHDL 5 09/21/2022 1054   VLDL 15.8 09/21/2022 1054   LDLCALC 158 (H) 05/30/2023 0802   Hepatic Function Panel     Component Value Date/Time   PROT 7.4 05/30/2023 0802   ALBUMIN 4.9 05/30/2023 0802   AST 18 05/30/2023 0802   ALT 20 05/30/2023 0802   ALKPHOS 78 05/30/2023 0802   BILITOT 0.3 05/30/2023 0802   BILIDIR 0.1 04/11/2018 1530      Component Value Date/Time   TSH 4.110 01/09/2023 1307   Nutritional Lab Results  Component Value Date   VD25OH 46.5 05/30/2023   VD25OH 27.7 (L) 01/09/2023   VD25OH 37.11 09/21/2022     Assessment and Plan     Obesity She is actively managing her obesity with a category two eating plan and walking 15 minutes three times weekly, resulting in a one-pound weight loss over the past month. Emphasis is on maintaining muscle mass to support metabolism and prevent weight regain. She is managing dietary weaknesses, such as potatoes, and experiences gastrointestinal discomfort with certain vegetables, particularly broccoli, possibly due to insufficient chewing or sensitivity. Alternatives like green beans and peas were suggested. - Continue category two eating plan - Continue walking 15 minutes three times a week - Consider using [employer] walking videos on days when unable to walk outside - Experiment with chewing vegetables more thoroughly to alleviate gastrointestinal discomfort - Consider incorporating green beans and peas as alternative vegetables  Vitamin D deficiency She requires a refill of prescription vitamin D due to an issue with her previous prescription at [facility]. Her last vitamin D level was reviewed, and a refill will be provided. She is advised to contact via MyChart if there are any issues with the pharmacy. - Refill prescription vitamin D - Contact via MyChart if there are issues with the pharmacy  Vitamin supplementation She inquired about additional vitamin supplementation. Previous vitamin levels, including B12, folic acid, and iron, were within normal ranges. The potential benefits and limitations of a multivitamin, particularly for women over 50, were discussed. A specific brand she was interested in was reviewed. It was concluded that while a multivitamin may not be necessary given her current nutritional status, it would not be harmful if she chooses to take one. - Consider taking a multivitamin for women over 50 if desired  Follow-up A follow-up appointment is scheduled for April 14th. Scheduling a subsequent appointment in May was suggested to ensure continuity of care. -  Schedule follow-up appointment in May         I have personally spent 30 minutes total time today in preparation, patient care, and documentation for this visit, including the following: review of clinical lab tests; review of medical tests/procedures/services.    She was informed of the importance of frequent follow up visits to maximize her success with intensive lifestyle modifications for her multiple health conditions.    Quillian Quince, MD

## 2023-08-28 ENCOUNTER — Ambulatory Visit (INDEPENDENT_AMBULATORY_CARE_PROVIDER_SITE_OTHER): Payer: 59 | Admitting: Family Medicine

## 2023-08-28 ENCOUNTER — Encounter (INDEPENDENT_AMBULATORY_CARE_PROVIDER_SITE_OTHER): Payer: Self-pay | Admitting: Family Medicine

## 2023-08-28 DIAGNOSIS — F5089 Other specified eating disorder: Secondary | ICD-10-CM

## 2023-08-28 DIAGNOSIS — E669 Obesity, unspecified: Secondary | ICD-10-CM

## 2023-08-28 DIAGNOSIS — F3289 Other specified depressive episodes: Secondary | ICD-10-CM

## 2023-08-28 DIAGNOSIS — Z6836 Body mass index (BMI) 36.0-36.9, adult: Secondary | ICD-10-CM | POA: Diagnosis not present

## 2023-08-28 DIAGNOSIS — E559 Vitamin D deficiency, unspecified: Secondary | ICD-10-CM

## 2023-08-28 MED ORDER — VITAMIN D (ERGOCALCIFEROL) 1.25 MG (50000 UNIT) PO CAPS: 50000.0000 [IU] | ORAL_CAPSULE | ORAL | 0 refills | Status: DC

## 2023-08-28 MED ORDER — BUPROPION HCL ER (SR) 150 MG PO TB12: 150.0000 mg | ORAL_TABLET | Freq: Every day | ORAL | 0 refills | Status: DC

## 2023-08-28 NOTE — Progress Notes (Signed)
 Office: [phone]  /  Fax: [phone]  WEIGHT SUMMARY AND BIOMETRICS  Anthropometric Measurements Height: 5\' 4"  (1.626 m) Weight: 214 lb (97.1 kg) BMI (Calculated): 36.72 Weight at Last Visit: 213lb Weight Lost Since Last Visit: 0lb Weight Gained Since Last Visit: 1lb Starting Weight: 245lb Total Weight Loss (lbs): 31 lb (14.1 kg) Peak Weight: 245lb   Body Composition  Body Fat %: 41.6 % Fat Mass (lbs): 89.4 lbs Muscle Mass (lbs): 119 lbs Total Body Water (lbs): 79.6 lbs Visceral Fat Rating : 12   Other Clinical Data Fasting: No Labs: No Today's Visit #: 11 Starting Date: 01/09/23    Chief Complaint: OBESITY    History of Present Illness Lauren Fox is a 58 year old female who presents for obesity treatment plan assessment and progress evaluation.  She is consistently adhering to her prescribed category two eating plan over the past month. Despite this adherence, she has gained one pound in the last month. She engages in physical activity, including walking, bike riding, and using a stepper for thirty minutes, three times a week.  She describes her work environment as highly stressful, contributing to her overall stress levels. She works with inpatient cases across two programs and feels overwhelmed due to the workload and perceived lack of staffing. She notes that her home life is not a source of stress.  She is considering starting water aerobics again, as she found it beneficial for her back pain in the past. She plans to attend classes at the aquatic center as the weather improves.  She has a history of vitamin D deficiency and is currently taking prescription vitamin D at a dose of fifty thousand international units per week. She mentions needing a refill for this medication.      PHYSICAL EXAM:  Blood pressure 115/73, pulse 65, temperature 97.9 F (36.6 C), height 5\' 4"  (1.626 m), weight 214 lb (97.1 kg), SpO2 100%. Body mass index is 36.73  kg/m.  DIAGNOSTIC DATA REVIEWED:  BMET    Component Value Date/Time   NA 142 05/30/2023 0802   K 5.2 05/30/2023 0802   CL 104 05/30/2023 0802   CO2 23 05/30/2023 0802   GLUCOSE 85 05/30/2023 0802   GLUCOSE 76 09/21/2022 1054   BUN 17 05/30/2023 0802   CREATININE 1.07 (H) 05/30/2023 0802   CALCIUM 9.9 05/30/2023 0802   GFRNONAA 73.28 06/19/2009 1129   Lab Results  Component Value Date   HGBA1C 5.5 05/30/2023   HGBA1C 5.5 11/25/2008   Lab Results  Component Value Date   INSULIN 18.0 05/30/2023   Lab Results  Component Value Date   TSH 4.110 01/09/2023   CBC    Component Value Date/Time   WBC 6.1 01/09/2023 1307   WBC 6.5 09/21/2022 1054   RBC 4.91 01/09/2023 1307   RBC 4.78 09/21/2022 1054   HGB 15.4 01/09/2023 1307   HCT 45.6 01/09/2023 1307   PLT 213 01/09/2023 1307   MCV 93 01/09/2023 1307   MCH 31.4 01/09/2023 1307   MCH 30.6 10/24/2014 0150   MCHC 33.8 01/09/2023 1307   MCHC 34.1 09/21/2022 1054   RDW 12.8 01/09/2023 1307   Iron Studies No results found for: "IRON", "TIBC", "FERRITIN", "IRONPCTSAT" Lipid Panel     Component Value Date/Time   CHOL 226 (H) 05/30/2023 0802   TRIG 106 05/30/2023 0802   HDL 49 05/30/2023 0802   CHOLHDL 5 09/21/2022 1054   VLDL 15.8 09/21/2022 1054   LDLCALC 158 (H) 05/30/2023  0865   Hepatic Function Panel     Component Value Date/Time   PROT 7.4 05/30/2023 0802   ALBUMIN 4.9 05/30/2023 0802   AST 18 05/30/2023 0802   ALT 20 05/30/2023 0802   ALKPHOS 78 05/30/2023 0802   BILITOT 0.3 05/30/2023 0802   BILIDIR 0.1 04/11/2018 1530      Component Value Date/Time   TSH 4.110 01/09/2023 1307   Nutritional Lab Results  Component Value Date   VD25OH 46.5 05/30/2023   VD25OH 27.7 (L) 01/09/2023   VD25OH 37.11 09/21/2022     Assessment and Plan Assessment & Plan Obesity and Emotional Eating Behaviors Despite adherence to a category two eating plan and exercising three times weekly, she gained one pound last  month. Work-related stress may contribute to weight gain via stress hormones that elevate blood sugar and promote fat storage. Bupropion is recommended to target neurotransmitters related to organization and reward, potentially reducing emotional eating and stress-related weight gain. It is generally well-tolerated, with dry mouth as the most common side effect. She has no contraindications such as a history of kidney stones or seizure disorders. - Start bupropion for emotional eating behaviors and stress management - Continue category two eating plan - Continue exercising three times a week - Consider water aerobics at the aquatic center  Vitamin D Deficiency She is on prescription vitamin D 50,000 IU weekly and a supplement containing vitamin D and DHEA. The supplement is safe with her prescription, and the DHEA is unlikely to cause issues despite potentially increasing testosterone levels. - Refill prescription vitamin D 50,000 IU weekly - Continue current vitamin D supplement  General Health Maintenance Regular physical activity is maintained, and she is considering water aerobics to improve joint and back health. Exercise benefits for overall health and stress reduction were discussed. - Encourage continuation of regular exercise - Support initiation of water aerobics    She was informed of the importance of frequent follow up visits to maximize her success with intensive lifestyle modifications for her multiple health conditions.    Jasmine Mesi, MD

## 2023-09-07 ENCOUNTER — Encounter (INDEPENDENT_AMBULATORY_CARE_PROVIDER_SITE_OTHER): Payer: Self-pay | Admitting: Family Medicine

## 2023-09-22 ENCOUNTER — Encounter: Payer: Self-pay | Admitting: Family Medicine

## 2023-09-22 ENCOUNTER — Ambulatory Visit (INDEPENDENT_AMBULATORY_CARE_PROVIDER_SITE_OTHER): Admitting: Family Medicine

## 2023-09-22 VITALS — BP 138/79 | HR 57 | Temp 98.4°F | Ht 64.0 in | Wt 222.2 lb

## 2023-09-22 DIAGNOSIS — E782 Mixed hyperlipidemia: Secondary | ICD-10-CM

## 2023-09-22 DIAGNOSIS — E66812 Obesity, class 2: Secondary | ICD-10-CM | POA: Diagnosis not present

## 2023-09-22 DIAGNOSIS — Z0000 Encounter for general adult medical examination without abnormal findings: Secondary | ICD-10-CM | POA: Diagnosis not present

## 2023-09-22 DIAGNOSIS — Z6838 Body mass index (BMI) 38.0-38.9, adult: Secondary | ICD-10-CM

## 2023-09-22 LAB — CBC WITH DIFFERENTIAL/PLATELET
Basophils Absolute: 0.1 10*3/uL (ref 0.0–0.1)
Basophils Relative: 0.9 % (ref 0.0–3.0)
Eosinophils Absolute: 0.3 10*3/uL (ref 0.0–0.7)
Eosinophils Relative: 4.9 % (ref 0.0–5.0)
HCT: 45.9 % (ref 36.0–46.0)
Hemoglobin: 15.1 g/dL — ABNORMAL HIGH (ref 12.0–15.0)
Lymphocytes Relative: 47.3 % — ABNORMAL HIGH (ref 12.0–46.0)
Lymphs Abs: 3.3 10*3/uL (ref 0.7–4.0)
MCHC: 33 g/dL (ref 30.0–36.0)
MCV: 95 fl (ref 78.0–100.0)
Monocytes Absolute: 0.5 10*3/uL (ref 0.1–1.0)
Monocytes Relative: 7.5 % (ref 3.0–12.0)
Neutro Abs: 2.7 10*3/uL (ref 1.4–7.7)
Neutrophils Relative %: 39.4 % — ABNORMAL LOW (ref 43.0–77.0)
Platelets: 208 10*3/uL (ref 150.0–400.0)
RBC: 4.83 Mil/uL (ref 3.87–5.11)
RDW: 13.6 % (ref 11.5–15.5)
WBC: 6.9 10*3/uL (ref 4.0–10.5)

## 2023-09-22 LAB — HEMOGLOBIN A1C: Hgb A1c MFr Bld: 5.8 % (ref 4.6–6.5)

## 2023-09-22 LAB — COMPREHENSIVE METABOLIC PANEL WITH GFR
ALT: 26 U/L (ref 0–35)
AST: 21 U/L (ref 0–37)
Albumin: 4.9 g/dL (ref 3.5–5.2)
Alkaline Phosphatase: 61 U/L (ref 39–117)
BUN: 19 mg/dL (ref 6–23)
CO2: 25 meq/L (ref 19–32)
Calcium: 10 mg/dL (ref 8.4–10.5)
Chloride: 104 meq/L (ref 96–112)
Creatinine, Ser: 1.06 mg/dL (ref 0.40–1.20)
GFR: 59.18 mL/min — ABNORMAL LOW (ref >60.00)
Glucose, Bld: 80 mg/dL (ref 70–99)
Potassium: 4.4 meq/L (ref 3.5–5.1)
Sodium: 141 meq/L (ref 135–145)
Total Bilirubin: 0.4 mg/dL (ref 0.2–1.2)
Total Protein: 7.8 g/dL (ref 6.0–8.3)

## 2023-09-22 LAB — LIPID PANEL
Cholesterol: 213 mg/dL — ABNORMAL HIGH (ref 0–200)
HDL: 53.8 mg/dL (ref >39.00)
LDL Cholesterol: 133 mg/dL — ABNORMAL HIGH (ref 0–99)
NonHDL: 159.12
Total CHOL/HDL Ratio: 4
Triglycerides: 131 mg/dL (ref 0.0–149.0)
VLDL: 26.2 mg/dL (ref 0.0–40.0)

## 2023-09-22 LAB — TSH: TSH: 4.37 u[IU]/mL (ref 0.35–5.50)

## 2023-09-22 LAB — T4, FREE: Free T4: 0.76 ng/dL (ref 0.60–1.60)

## 2023-09-22 NOTE — Progress Notes (Signed)
 Established Patient Office Visit   Subjective  Patient ID: Lauren Fox, female    DOB: 08-28-1965  Age: 58 y.o. MRN: [id number]  Chief Complaint  Patient presents with   Annual Exam    Patient is a 58 year old female seen for CPE.  Patient states she is doing well overall.  Endorses occasional seasonal allergy symptoms.  Seen by weight management.  Notes weight loss.  Has cut down on sugar intake which seems to have helped.  Notes some improvement in joint pain with weight loss.  Patient endorses a week off from work due to get away from increased stress.    Patient Active Problem List   Diagnosis Date Noted   Elevated blood pressure reading in office without diagnosis of hypertension 05/30/2023   BMI 36.0-36.9,adult 03/07/2023   Prediabetes 12/04/2022   Menopausal flushing 01/18/2018   Night sweats 01/18/2018   De Quervain's syndrome (tenosynovitis) 09/04/2017   Left otitis media 04/12/2017   Vertigo 10/25/2014   Pain and swelling of ankle 02/23/2014   Hyperprolactinemia (HCC) 02/21/2012   Lumbar disc disease 02/21/2012   Heart palpitations 02/21/2012   Vitamin D  deficiency 02/23/2011   Impaired glucose tolerance 08/24/2010   Preventative health care 08/24/2010   Mixed hyperlipidemia 11/25/2008   Gout, unspecified 11/25/2008   Morbid obesity (HCC) 11/25/2008   Allergic rhinitis 11/25/2008   Past Medical History:  Diagnosis Date   Acute low back pain 06/10/2013   ALLERGIC RHINITIS 11/25/2008   Allergy    Arthritis    DIZZINESS 11/25/2008   Fatty liver    FOOT PAIN, LEFT 12/19/2009   GLUCOSE INTOLERANCE 11/25/2008   Gout, unspecified 11/25/2008   Headache(784.0) 06/22/2010   High cholesterol    HYPERLIPIDEMIA 11/25/2008   Impaired glucose tolerance 08/24/2010   Lactose intolerance    Lower back pain    Lumbar disc disease 02/21/2012   Morbid obesity (HCC) 11/25/2008   Palpitations    Pre-diabetes    Swelling of both lower extremities    Unspecified  backache 12/17/2009   Vitamin D  deficiency    Past Surgical History:  Procedure Laterality Date   ABDOMINAL HYSTERECTOMY  20063   non malignant   s/p pilonidal cyst     TUBAL LIGATION     Social History   Tobacco Use   Smoking status: Former    Current packs/day: 0.00    Types: Cigarettes    Quit date: 05/16/2012    Years since quitting: 11.3   Smokeless tobacco: Never   Tobacco comments:    pt verbalized never smoking  Substance Use Topics   Alcohol use: Yes    Alcohol/week: 1.0 standard drink of alcohol    Types: 1 Glasses of wine per week    Comment: Occasionally   Drug use: Yes    Types: Marijuana   Family History  Problem Relation Age of Onset   Hyperlipidemia Mother    Hypertension Mother    Arthritis Mother    Cancer Mother    COPD Mother    Stroke Mother    Glaucoma Father    Hypertension Father    Diabetes Father    Diabetes Other    Heart attack Other    Coronary artery disease Other    Diabetes Other    Colon cancer Neg Hx    Esophageal cancer Neg Hx    Rectal cancer Neg Hx    Stomach cancer Neg Hx    No Known Allergies    ROS Negative unless  stated above    Objective:     BP 138/79 (BP Location: Right Arm, Cuff Size: Large)   Pulse (!) 57   Temp 98.4 F (36.9 C) (Oral)   Ht 5\' 4"  (1.626 m)   Wt 222 lb 3.2 oz (100.8 kg)   SpO2 98%   BMI 38.14 kg/m  BP Readings from Last 3 Encounters:  09/22/23 138/79  08/28/23 115/73  07/25/23 126/80   Wt Readings from Last 3 Encounters:  09/22/23 222 lb 3.2 oz (100.8 kg)  08/28/23 214 lb (97.1 kg)  07/25/23 213 lb (96.6 kg)   Physical Exam Constitutional:      Appearance: Normal appearance.  HENT:     Head: Normocephalic and atraumatic.     Right Ear: Tympanic membrane, ear canal and external ear normal.     Left Ear: Tympanic membrane, ear canal and external ear normal.     Nose: Nose normal.     Mouth/Throat:     Mouth: Mucous membranes are moist.     Pharynx: No oropharyngeal exudate  or posterior oropharyngeal erythema.  Eyes:     General: No scleral icterus.    Extraocular Movements: Extraocular movements intact.     Conjunctiva/sclera: Conjunctivae normal.     Pupils: Pupils are equal, round, and reactive to light.  Neck:     Thyroid : No thyromegaly.  Cardiovascular:     Rate and Rhythm: Normal rate and regular rhythm.     Pulses: Normal pulses.     Heart sounds: Normal heart sounds. No murmur heard.    No friction rub.  Pulmonary:     Effort: Pulmonary effort is normal.     Breath sounds: Normal breath sounds. No wheezing, rhonchi or rales.  Abdominal:     General: Bowel sounds are normal.     Palpations: Abdomen is soft.     Tenderness: There is no abdominal tenderness.  Musculoskeletal:        General: No deformity. Normal range of motion.  Lymphadenopathy:     Cervical: No cervical adenopathy.  Skin:    General: Skin is warm and dry.     Findings: No lesion.  Neurological:     General: No focal deficit present.     Mental Status: She is alert and oriented to person, place, and time.  Psychiatric:        Mood and Affect: Mood normal.        Thought Content: Thought content normal.        09/22/2023   11:47 AM 12/22/2022    4:45 PM 09/21/2022    9:49 AM  Depression screen PHQ 2/9  Decreased Interest 0 0 0  Down, Depressed, Hopeless 0 0 0  PHQ - 2 Score 0 0 0  Altered sleeping 0 0   Tired, decreased energy 0 0   Change in appetite 0    Feeling bad or failure about yourself  0 0   Trouble concentrating 0 0   Moving slowly or fidgety/restless 0 0   Suicidal thoughts 0 0   PHQ-9 Score 0 0       09/22/2023   11:47 AM 12/22/2022    4:45 PM 09/21/2022    9:49 AM 09/10/2020    8:32 AM  GAD 7 : Generalized Anxiety Score  Nervous, Anxious, on Edge 0 0 0 0  Control/stop worrying 0 0 0 0  Worry too much - different things 0 0 0 0  Trouble relaxing 0 0 0 0  Restless 0 0 0 0  Easily annoyed or irritable 0 0 0 1  Afraid - awful might happen 0 0 0 0   Total GAD 7 Score 0 0 0 1  Anxiety Difficulty    Not difficult at all     No results found for any visits on 09/22/23.    Assessment & Plan:  Well adult exam -     CBC with Differential/Platelet; Future -     Comprehensive metabolic panel with GFR; Future -     Hemoglobin A1c; Future -     Lipid panel; Future -     T4, free; Future -     TSH; Future  Mixed hyperlipidemia -     Comprehensive metabolic panel with GFR; Future -     Lipid panel; Future  Age-appropriate health screenings discussed.  Obtain labs.  Immunizations reviewed.  Mammogram up-to-date done 01/30/2023.  Colonoscopy up-to-date done 10/15/2018.  Continue current medications and lifestyle modifications.  Patient congratulated on weight loss.  Next CPE in 1 year.  Follow-up sooner if needed.  No follow-ups on file.   Viola Greulich, MD

## 2023-09-25 ENCOUNTER — Encounter: Payer: Self-pay | Admitting: Family Medicine

## 2023-09-25 ENCOUNTER — Ambulatory Visit (INDEPENDENT_AMBULATORY_CARE_PROVIDER_SITE_OTHER): Admitting: Family Medicine

## 2023-09-25 ENCOUNTER — Encounter (INDEPENDENT_AMBULATORY_CARE_PROVIDER_SITE_OTHER): Payer: Self-pay | Admitting: Family Medicine

## 2023-09-25 VITALS — BP 120/73 | HR 59 | Temp 97.9°F | Ht 64.0 in | Wt 218.0 lb

## 2023-09-25 DIAGNOSIS — F3289 Other specified depressive episodes: Secondary | ICD-10-CM

## 2023-09-25 DIAGNOSIS — F5089 Other specified eating disorder: Secondary | ICD-10-CM | POA: Diagnosis not present

## 2023-09-25 DIAGNOSIS — E669 Obesity, unspecified: Secondary | ICD-10-CM

## 2023-09-25 DIAGNOSIS — Z6837 Body mass index (BMI) 37.0-37.9, adult: Secondary | ICD-10-CM | POA: Diagnosis not present

## 2023-09-25 DIAGNOSIS — E559 Vitamin D deficiency, unspecified: Secondary | ICD-10-CM | POA: Diagnosis not present

## 2023-09-25 DIAGNOSIS — Z6841 Body Mass Index (BMI) 40.0 and over, adult: Secondary | ICD-10-CM

## 2023-09-25 MED ORDER — BUPROPION HCL ER (SR) 200 MG PO TB12
200.0000 mg | ORAL_TABLET | Freq: Every day | ORAL | 0 refills | Status: DC
Start: 2023-09-25 — End: 2023-10-12

## 2023-09-25 MED ORDER — VITAMIN D (ERGOCALCIFEROL) 1.25 MG (50000 UNIT) PO CAPS: 50000.0000 [IU] | ORAL_CAPSULE | ORAL | 0 refills | Status: DC

## 2023-09-25 MED ORDER — BUPROPION HCL ER (SR) 200 MG PO TB12: 200.0000 mg | ORAL_TABLET | Freq: Two times a day (BID) | ORAL | 0 refills | Status: DC

## 2023-09-25 NOTE — Progress Notes (Signed)
 Office: [phone]  /  Fax: [phone]  WEIGHT SUMMARY AND BIOMETRICS  Anthropometric Measurements Height: 5\' 4"  (1.626 m) Weight: 218 lb (98.9 kg) BMI (Calculated): 37.4 Weight at Last Visit: 214 lb Weight Lost Since Last Visit: 0 Weight Gained Since Last Visit: 4 lb Starting Weight: 245 lb Total Weight Loss (lbs): 27 lb (12.2 kg) Peak Weight: 245 lb   Body Composition  Body Fat %: 41.9 % Fat Mass (lbs): 91.6 lbs Muscle Mass (lbs): 120 lbs Total Body Water (lbs): 82.2 lbs Visceral Fat Rating : 12   Other Clinical Data Fasting: no Labs: no Today's Visit #: 12 Starting Date: 01/09/23    Chief Complaint: OBESITY    History of Present Illness [name] is a 58 year old female who presents for a follow-up on her obesity treatment plan and progress assessment.  She is adhering to a structured category three eating plan 95% of the time but has experienced a weight gain of four pounds over the past month. Non-hunger eating, such as stress and comfort eating, remains a challenge.  She engages in physical activity, utilizing a stationary bike and stepper for ten minutes, three times a week. She reports increased stress levels, particularly related to her job of eighteen years, and recently took a week off work to manage stress due to overwhelming work demands.  She is currently on a low dose of bupropion  and takes vitamin D  as prescribed. No changes in hunger patterns are reported; she typically feels hungry before meals but not shortly after eating.      PHYSICAL EXAM:  Blood pressure 120/73, pulse (!) 59, temperature 97.9 F (36.6 C), height 5\' 4"  (1.626 m), weight 218 lb (98.9 kg), SpO2 99%. Body mass index is 37.42 kg/m.  DIAGNOSTIC DATA REVIEWED:  BMET    Component Value Date/Time   NA 141 09/22/2023 1150   NA 142 05/30/2023 0802   K 4.4 09/22/2023 1150   CL 104 09/22/2023 1150   CO2 25 09/22/2023 1150   GLUCOSE 80 09/22/2023 1150   BUN 19  09/22/2023 1150   BUN 17 05/30/2023 0802   CREATININE 1.06 09/22/2023 1150   CALCIUM  10.0 09/22/2023 1150   GFRNONAA 73.28 06/19/2009 1129   Lab Results  Component Value Date   HGBA1C 5.8 09/22/2023   HGBA1C 5.5 11/25/2008   Lab Results  Component Value Date   INSULIN  18.0 05/30/2023   Lab Results  Component Value Date   TSH 4.37 09/22/2023   CBC    Component Value Date/Time   WBC 6.9 09/22/2023 1150   RBC 4.83 09/22/2023 1150   HGB 15.1 (H) 09/22/2023 1150   HGB 15.4 01/09/2023 1307   HCT 45.9 09/22/2023 1150   HCT 45.6 01/09/2023 1307   PLT 208.0 09/22/2023 1150   PLT 213 01/09/2023 1307   MCV 95.0 09/22/2023 1150   MCV 93 01/09/2023 1307   MCH 31.4 01/09/2023 1307   MCH 30.6 10/24/2014 0150   MCHC 33.0 09/22/2023 1150   RDW 13.6 09/22/2023 1150   RDW 12.8 01/09/2023 1307   Iron Studies No results found for: "IRON", "TIBC", "FERRITIN", "IRONPCTSAT" Lipid Panel     Component Value Date/Time   CHOL 213 (H) 09/22/2023 1150   CHOL 226 (H) 05/30/2023 0802   TRIG 131.0 09/22/2023 1150   HDL 53.80 09/22/2023 1150   HDL 49 05/30/2023 0802   CHOLHDL 4 09/22/2023 1150   VLDL 26.2 09/22/2023 1150   LDLCALC 133 (H) 09/22/2023 1150  LDLCALC 158 (H) 05/30/2023 0802   Hepatic Function Panel     Component Value Date/Time   PROT 7.8 09/22/2023 1150   PROT 7.4 05/30/2023 0802   ALBUMIN 4.9 09/22/2023 1150   ALBUMIN 4.9 05/30/2023 0802   AST 21 09/22/2023 1150   ALT 26 09/22/2023 1150   ALKPHOS 61 09/22/2023 1150   BILITOT 0.4 09/22/2023 1150   BILITOT 0.3 05/30/2023 0802   BILIDIR 0.1 04/11/2018 1530      Component Value Date/Time   TSH 4.37 09/22/2023 1150   Nutritional Lab Results  Component Value Date   VD25OH 46.5 05/30/2023   VD25OH 27.7 (L) 01/09/2023   VD25OH 37.11 09/21/2022     Assessment and Plan Assessment & Plan Obesity Obesity with recent weight gain of four pounds despite adherence to a structured eating plan and exercise regimen.  Stress and emotional eating identified as contributing factors. Discussed the impact of stress on metabolism and fat storage. Emphasized the importance of protein intake to maintain metabolism and prevent weight gain. Encouraged real food protein over supplements due to the thermogenic effect of food, which can aid in weight maintenance by burning additional calories during digestion. - Increase bupropion  to 200 mg for stress and emotional eating. - Emphasize protein intake at every meal to maintain metabolism. - Encourage real food protein sources such as lean meats, chicken and tuna pouches, hard boiled eggs, and edamame. - Continue exercise regimen of stationary bike and stepper for ten minutes three times a week. - Focus on weight maintenance strategies rather than weight loss during periods of high stress. - Consider exploring alternative work opportunities to reduce stress.  Emotional eating behaviors Emotional eating behaviors exacerbated by increased stress levels at work. Discussed the role of stress in increasing hunger and fat storage. Explored the possibility of changing work environment to reduce stress. Discussed the potential benefits of increasing bupropion  dosage to help manage stress and emotional eating. Informed that the increased dosage may take about two weeks to take effect, with some individuals noticing changes sooner. - Increase bupropion  to 200 mg to help manage stress and emotional eating.    She was informed of the importance of frequent follow up visits to maximize her success with intensive lifestyle modifications for her multiple health conditions.    Jasmine Mesi, MD

## 2023-10-10 ENCOUNTER — Encounter (INDEPENDENT_AMBULATORY_CARE_PROVIDER_SITE_OTHER): Payer: Self-pay | Admitting: Family Medicine

## 2023-10-12 MED ORDER — BUPROPION HCL ER (SR) 150 MG PO TB12: 150.0000 mg | ORAL_TABLET | Freq: Two times a day (BID) | ORAL | 0 refills | Status: DC

## 2023-10-30 ENCOUNTER — Ambulatory Visit (INDEPENDENT_AMBULATORY_CARE_PROVIDER_SITE_OTHER): Admitting: Family Medicine

## 2023-10-30 ENCOUNTER — Encounter (INDEPENDENT_AMBULATORY_CARE_PROVIDER_SITE_OTHER): Payer: Self-pay | Admitting: Family Medicine

## 2023-10-30 VITALS — BP 120/77 | HR 65 | Temp 98.0°F | Ht 64.0 in | Wt 222.0 lb

## 2023-10-30 DIAGNOSIS — G8929 Other chronic pain: Secondary | ICD-10-CM

## 2023-10-30 DIAGNOSIS — M5442 Lumbago with sciatica, left side: Secondary | ICD-10-CM

## 2023-10-30 DIAGNOSIS — F5089 Other specified eating disorder: Secondary | ICD-10-CM

## 2023-10-30 DIAGNOSIS — F3289 Other specified depressive episodes: Secondary | ICD-10-CM

## 2023-10-30 DIAGNOSIS — E669 Obesity, unspecified: Secondary | ICD-10-CM

## 2023-10-30 DIAGNOSIS — E559 Vitamin D deficiency, unspecified: Secondary | ICD-10-CM

## 2023-10-30 DIAGNOSIS — Z6838 Body mass index (BMI) 38.0-38.9, adult: Secondary | ICD-10-CM

## 2023-10-30 DIAGNOSIS — Z6837 Body mass index (BMI) 37.0-37.9, adult: Secondary | ICD-10-CM

## 2023-10-30 DIAGNOSIS — Z6841 Body Mass Index (BMI) 40.0 and over, adult: Secondary | ICD-10-CM

## 2023-10-30 MED ORDER — VITAMIN D (ERGOCALCIFEROL) 1.25 MG (50000 UNIT) PO CAPS
50000.0000 [IU] | ORAL_CAPSULE | ORAL | 0 refills | Status: DC
Start: 2023-10-30 — End: 2023-12-11

## 2023-10-30 NOTE — Progress Notes (Signed)
 Office: [phone]  /  Fax: [phone]  WEIGHT SUMMARY AND BIOMETRICS  Anthropometric Measurements Height: 5' 4 (1.626 m) Weight: 222 lb (100.7 kg) BMI (Calculated): 38.09 Weight at Last Visit: 218 lb Weight Lost Since Last Visit: 0 Weight Gained Since Last Visit: 4 lb Starting Weight: 245 lb Total Weight Loss (lbs): 23 lb (10.4 kg) Peak Weight: 245 lb   Body Composition  Body Fat %: 42.9 % Fat Mass (lbs): 95.2 lbs Muscle Mass (lbs): 120.4 lbs Total Body Water (lbs): 83.2 lbs Visceral Fat Rating : 13   Other Clinical Data Fasting: no Labs: no Today's Visit #: 13 Starting Date: 01/09/23    Chief Complaint: OBESITY   History of Present Illness Lauren Fox is a 58 year old female who presents for a follow-up on her obesity treatment plan and progress assessment.  She is adhering to a category two eating plan for obesity management 95% of the time but struggles with hunger and boredom, leading to occasional deviations. Despite her efforts, she has gained four pounds recently.  She experiences increased sciatic pain, primarily on the left side, radiating to her knee and sometimes wrapping around to the side of her leg. The pain is persistent, occasionally causing weakness and numbness in her leg. She has undergone physical therapy, injections, and medial blocks in the past. The pain significantly impacts her mobility and daily activities.  She experiences jitteriness with certain medications and has stopped taking bupropion  due to its side effects. She is currently taking vitamin D  supplements without any issues.      PHYSICAL EXAM:  Blood pressure 120/77, pulse 65, temperature 98 F (36.7 C), height 5' 4 (1.626 m), weight 222 lb (100.7 kg), SpO2 99%. Body mass index is 38.11 kg/m.  DIAGNOSTIC DATA REVIEWED:  BMET    Component Value Date/Time   NA 141 09/22/2023 1150   NA 142 05/30/2023 0802   K 4.4 09/22/2023 1150   CL 104 09/22/2023 1150    CO2 25 09/22/2023 1150   GLUCOSE 80 09/22/2023 1150   BUN 19 09/22/2023 1150   BUN 17 05/30/2023 0802   CREATININE 1.06 09/22/2023 1150   CALCIUM  10.0 09/22/2023 1150   GFRNONAA 73.28 06/19/2009 1129   Lab Results  Component Value Date   HGBA1C 5.8 09/22/2023   HGBA1C 5.5 11/25/2008   Lab Results  Component Value Date   INSULIN  18.0 05/30/2023   Lab Results  Component Value Date   TSH 4.37 09/22/2023   CBC    Component Value Date/Time   WBC 6.9 09/22/2023 1150   RBC 4.83 09/22/2023 1150   HGB 15.1 (H) 09/22/2023 1150   HGB 15.4 01/09/2023 1307   HCT 45.9 09/22/2023 1150   HCT 45.6 01/09/2023 1307   PLT 208.0 09/22/2023 1150   PLT 213 01/09/2023 1307   MCV 95.0 09/22/2023 1150   MCV 93 01/09/2023 1307   MCH 31.4 01/09/2023 1307   MCH 30.6 10/24/2014 0150   MCHC 33.0 09/22/2023 1150   RDW 13.6 09/22/2023 1150   RDW 12.8 01/09/2023 1307   Iron Studies No results found for: IRON, TIBC, FERRITIN, IRONPCTSAT Lipid Panel     Component Value Date/Time   CHOL 213 (H) 09/22/2023 1150   CHOL 226 (H) 05/30/2023 0802   TRIG 131.0 09/22/2023 1150   HDL 53.80 09/22/2023 1150   HDL 49 05/30/2023 0802   CHOLHDL 4 09/22/2023 1150   VLDL 26.2 09/22/2023 1150   LDLCALC 133 (H) 09/22/2023 1150   LDLCALC  158 (H) 05/30/2023 0802   Hepatic Function Panel     Component Value Date/Time   PROT 7.8 09/22/2023 1150   PROT 7.4 05/30/2023 0802   ALBUMIN 4.9 09/22/2023 1150   ALBUMIN 4.9 05/30/2023 0802   AST 21 09/22/2023 1150   ALT 26 09/22/2023 1150   ALKPHOS 61 09/22/2023 1150   BILITOT 0.4 09/22/2023 1150   BILITOT 0.3 05/30/2023 0802   BILIDIR 0.1 04/11/2018 1530      Component Value Date/Time   TSH 4.37 09/22/2023 1150   Nutritional Lab Results  Component Value Date   VD25OH 46.5 05/30/2023   VD25OH 27.7 (L) 01/09/2023   VD25OH 37.11 09/21/2022     Assessment and Plan Assessment & Plan Obesity Following the category two eating plan 95% of the time  but has gained four pounds recently. Reports increased hunger and boredom with current food options. Stress levels have improved, and she is not taking bupropion  due to feeling jittery. Emphasized maintaining a calorie range between 1200-1400 calories per day and consuming at least 80 grams of protein to aid weight loss and prevent metabolic slowdown. Discussed using ChatGPT for meal planning to introduce variety while meeting nutritional goals. Advised against oatmeal as a regular breakfast due to its high carbohydrate content and low protein, suggesting it as an occasional treat instead. - Continue category two eating plan with modifications for variety - Maintain daily calorie intake between 1200-1400 calories - Ensure daily protein intake of at least 80 grams - Use ChatGPT for meal planning - Stop bupropion  due to side effects  Emotional Eating Behaviors Experiences emotional eating behaviors, particularly when not physically hungry. Emphasized the importance of eating satisfying foods to prevent using food for comfort. Discussed that finding enjoyable foods that meet nutritional criteria can help reduce non-hunger related eating. - Focus on eating satisfying foods to prevent emotional eating  Sciatic Pain Chronic sciatic pain primarily on the left side, shooting down to the knee and wrapping around the side. Pain exacerbated by sitting and sometimes causes the foot to fall asleep. Suspected compression of the lateral cutaneous nerve. Has tried physical therapy, injections, and medial blocks, and is considering nerve ablation for pain management. Discussed that if the pain is severe enough, nerve ablation may be worth pursuing to alleviate limitations in mobility. - Continue seeing back specialist - Consider nerve ablation for pain management - Perform recommended stretches for sciatic pain relief  Vitamin D  Deficiency Taking vitamin D  supplements without any adverse effects. - Continue  vitamin D  supplementation    She was informed of the importance of frequent follow up visits to maximize her success with intensive lifestyle modifications for her multiple health conditions.    Jasmine Mesi, MD

## 2023-11-02 ENCOUNTER — Ambulatory Visit (INDEPENDENT_AMBULATORY_CARE_PROVIDER_SITE_OTHER): Admitting: Family Medicine

## 2023-11-21 ENCOUNTER — Other Ambulatory Visit (INDEPENDENT_AMBULATORY_CARE_PROVIDER_SITE_OTHER): Payer: Self-pay | Admitting: Family Medicine

## 2023-11-21 DIAGNOSIS — E559 Vitamin D deficiency, unspecified: Secondary | ICD-10-CM

## 2023-12-11 ENCOUNTER — Ambulatory Visit (INDEPENDENT_AMBULATORY_CARE_PROVIDER_SITE_OTHER): Admitting: Family Medicine

## 2023-12-11 ENCOUNTER — Encounter (INDEPENDENT_AMBULATORY_CARE_PROVIDER_SITE_OTHER): Payer: Self-pay | Admitting: Family Medicine

## 2023-12-11 VITALS — BP 128/81 | HR 60 | Temp 98.1°F | Ht 64.0 in | Wt 222.0 lb

## 2023-12-11 DIAGNOSIS — Z6841 Body Mass Index (BMI) 40.0 and over, adult: Secondary | ICD-10-CM

## 2023-12-11 DIAGNOSIS — E559 Vitamin D deficiency, unspecified: Secondary | ICD-10-CM

## 2023-12-11 DIAGNOSIS — E669 Obesity, unspecified: Secondary | ICD-10-CM | POA: Diagnosis not present

## 2023-12-11 DIAGNOSIS — Z6838 Body mass index (BMI) 38.0-38.9, adult: Secondary | ICD-10-CM | POA: Diagnosis not present

## 2023-12-11 MED ORDER — VITAMIN D (ERGOCALCIFEROL) 1.25 MG (50000 UNIT) PO CAPS: 50000.0000 [IU] | ORAL_CAPSULE | ORAL | 0 refills | Status: DC

## 2023-12-11 NOTE — Progress Notes (Signed)
 Office: [phone]  /  Fax: [phone]  WEIGHT SUMMARY AND BIOMETRICS  Anthropometric Measurements Height: 5' 4 (1.626 m) Weight: 222 lb (100.7 kg) BMI (Calculated): 38.09 Weight at Last Visit: 222 lb Weight Lost Since Last Visit: 0 lb Weight Gained Since Last Visit: 0 lb Starting Weight: 245 lb Total Weight Loss (lbs): 23 lb (10.4 kg) Peak Weight: 245 lb   Body Composition  Body Fat %: 42.6 % Fat Mass (lbs): 94.6 lbs Muscle Mass (lbs): 121.2 lbs Total Body Water (lbs): 84 lbs Visceral Fat Rating : 13   Other Clinical Data Fasting: no Labs: no Today's Visit #: 14 Starting Date: 01/09/23    Chief Complaint: OBESITY    History of Present Illness Lauren Fox is a 58 year old female who presents for obesity treatment and progress assessment.  She is adhering to a category two eating plan approximately 90% of the time and has maintained her weight over the last six weeks. She engages in cardiovascular exercise for about 30 minutes three times a week, and reports using a stationary bike and stepper. Her husband recently bought her an outdoor bicycle, which she plans to use more in the fall when the weather is cooler.  She experiences bloating and gas after eating, although it does not cause pain. No issues with constipation or diarrhea. She feels tired and drained when exposed to heat for too long but does not experience headaches. She is mindful of staying hydrated and avoiding prolonged heat exposure.  She is currently taking vitamin D  supplements. She discusses her dietary habits, mentioning that breakfast is not an issue, but lunch can be challenging as she gets tired of eating sandwiches. She wants to incorporate smoothies into her diet, as she used to enjoy making them.      PHYSICAL EXAM:  Blood pressure 128/81, pulse 60, temperature 98.1 F (36.7 C), height 5' 4 (1.626 m), weight 222 lb (100.7 kg), SpO2 97%. Body mass index is 38.11  kg/m.  DIAGNOSTIC DATA REVIEWED:  BMET    Component Value Date/Time   NA 141 09/22/2023 1150   NA 142 05/30/2023 0802   K 4.4 09/22/2023 1150   CL 104 09/22/2023 1150   CO2 25 09/22/2023 1150   GLUCOSE 80 09/22/2023 1150   BUN 19 09/22/2023 1150   BUN 17 05/30/2023 0802   CREATININE 1.06 09/22/2023 1150   CALCIUM  10.0 09/22/2023 1150   GFRNONAA 73.28 06/19/2009 1129   Lab Results  Component Value Date   HGBA1C 5.8 09/22/2023   HGBA1C 5.5 11/25/2008   Lab Results  Component Value Date   INSULIN  18.0 05/30/2023   Lab Results  Component Value Date   TSH 4.37 09/22/2023   CBC    Component Value Date/Time   WBC 6.9 09/22/2023 1150   RBC 4.83 09/22/2023 1150   HGB 15.1 (H) 09/22/2023 1150   HGB 15.4 01/09/2023 1307   HCT 45.9 09/22/2023 1150   HCT 45.6 01/09/2023 1307   PLT 208.0 09/22/2023 1150   PLT 213 01/09/2023 1307   MCV 95.0 09/22/2023 1150   MCV 93 01/09/2023 1307   MCH 31.4 01/09/2023 1307   MCH 30.6 10/24/2014 0150   MCHC 33.0 09/22/2023 1150   RDW 13.6 09/22/2023 1150   RDW 12.8 01/09/2023 1307   Iron Studies No results found for: IRON, TIBC, FERRITIN, IRONPCTSAT Lipid Panel     Component Value Date/Time   CHOL 213 (H) 09/22/2023 1150   CHOL 226 (H) 05/30/2023 0802  TRIG 131.0 09/22/2023 1150   HDL 53.80 09/22/2023 1150   HDL 49 05/30/2023 0802   CHOLHDL 4 09/22/2023 1150   VLDL 26.2 09/22/2023 1150   LDLCALC 133 (H) 09/22/2023 1150   LDLCALC 158 (H) 05/30/2023 0802   Hepatic Function Panel     Component Value Date/Time   PROT 7.8 09/22/2023 1150   PROT 7.4 05/30/2023 0802   ALBUMIN 4.9 09/22/2023 1150   ALBUMIN 4.9 05/30/2023 0802   AST 21 09/22/2023 1150   ALT 26 09/22/2023 1150   ALKPHOS 61 09/22/2023 1150   BILITOT 0.4 09/22/2023 1150   BILITOT 0.3 05/30/2023 0802   BILIDIR 0.1 04/11/2018 1530      Component Value Date/Time   TSH 4.37 09/22/2023 1150   Nutritional Lab Results  Component Value Date   VD25OH 46.5  05/30/2023   VD25OH 27.7 (L) 01/09/2023   VD25OH 37.11 09/21/2022     Assessment and Plan Assessment & Plan Obesity She adheres to a category two eating plan 90% of the time and performs cardio exercises for 30 minutes three times weekly. Her weight has been stable over the past six weeks, with a slight reduction in fat and an increase in water weight, likely due to dehydration and fluid retention. She experiences bloating and gas, possibly related to changes in gut microbiota during weight loss. Adequate hydration and gradual fiber intake are essential to manage these symptoms. - Continue category two eating plan. - Maintain cardio exercise 30 minutes three times weekly. - Increase hydration to manage fluid retention and prevent dehydration. - Gradually increase dietary fiber, particularly with more berries. - Consider smoothies for breakfast with a provided recipe.  Vitamin D  deficiency She is presumed to be on vitamin D  supplementation. - Ensure continuation of vitamin D  supplementation.  Behavioral Modifications Regular physical activity and dietary mindfulness are noted. Guidance on hydration and fiber intake supports weight management and overall health. Avoiding excessive heat exposure is crucial to prevent dehydration and fluid retention. - Encourage increasing exercise frequency to four times weekly. - Advise on hydration management, especially in hot weather. - Promote dietary fiber intake for gut health.       She was informed of the importance of frequent follow up visits to maximize her success with intensive lifestyle modifications for her multiple health conditions.    Louann Penton, MD

## 2024-01-08 ENCOUNTER — Ambulatory Visit (INDEPENDENT_AMBULATORY_CARE_PROVIDER_SITE_OTHER): Admitting: Family Medicine

## 2024-01-15 ENCOUNTER — Other Ambulatory Visit (INDEPENDENT_AMBULATORY_CARE_PROVIDER_SITE_OTHER): Payer: Self-pay | Admitting: Family Medicine

## 2024-01-15 DIAGNOSIS — E559 Vitamin D deficiency, unspecified: Secondary | ICD-10-CM

## 2024-01-31 LAB — HM MAMMOGRAPHY

## 2024-02-05 ENCOUNTER — Encounter (INDEPENDENT_AMBULATORY_CARE_PROVIDER_SITE_OTHER): Payer: Self-pay | Admitting: Family Medicine

## 2024-02-05 ENCOUNTER — Ambulatory Visit (INDEPENDENT_AMBULATORY_CARE_PROVIDER_SITE_OTHER): Admitting: Family Medicine

## 2024-02-05 VITALS — BP 119/77 | HR 63 | Temp 97.8°F | Ht 64.0 in | Wt 225.0 lb

## 2024-02-05 DIAGNOSIS — E66813 Obesity, class 3: Secondary | ICD-10-CM

## 2024-02-05 DIAGNOSIS — R198 Other specified symptoms and signs involving the digestive system and abdomen: Secondary | ICD-10-CM

## 2024-02-05 DIAGNOSIS — E559 Vitamin D deficiency, unspecified: Secondary | ICD-10-CM | POA: Diagnosis not present

## 2024-02-05 DIAGNOSIS — Z6838 Body mass index (BMI) 38.0-38.9, adult: Secondary | ICD-10-CM | POA: Diagnosis not present

## 2024-02-05 MED ORDER — VITAMIN D (ERGOCALCIFEROL) 1.25 MG (50000 UNIT) PO CAPS
50000.0000 [IU] | ORAL_CAPSULE | ORAL | 0 refills | Status: DC
Start: 2024-02-05 — End: 2024-03-07

## 2024-02-05 NOTE — Progress Notes (Signed)
 Office: [phone]  /  Fax: [phone]  WEIGHT SUMMARY AND BIOMETRICS  Anthropometric Measurements Height: 5' 4 (1.626 m) Weight: 225 lb (102.1 kg) BMI (Calculated): 38.6 Weight at Last Visit: 222 lb Weight Lost Since Last Visit: 0 Weight Gained Since Last Visit: 3 lb Starting Weight: 245 lb Total Weight Loss (lbs): 20 lb (9.072 kg) Peak Weight: 245 lb   Body Composition  Body Fat %: 43.5 % Fat Mass (lbs): 98 lbs Muscle Mass (lbs): 121 lbs Total Body Water (lbs): 85.4 lbs Visceral Fat Rating : 13   Other Clinical Data Fasting: no Labs: no Today's Visit #: 15 Starting Date: 01/09/23    Chief Complaint: OBESITY    History of Present Illness TAWYNA PELLOT is a 58 year old female who presents for obesity treatment and progress assessment.  She has been adhering to the category two eating plan approximately 75% of the time. Her physical activity has decreased following a recent back procedure, reducing her walking from 30 minutes three to five times a week. She has gained 3.3 pounds since her last visit two months ago. She feels bored with her current meal plan, especially at breakfast, and has been trying different options like smoothies, which she finds satisfying. She is interested in exploring additional breakfast options.  A month ago, she underwent a procedure to burn nerves causing sciatica pain, and she reports a noticeable improvement in her symptoms. However, she still experiences some muscle strain and back pain. Despite this, she continues to walk.  She experiences digestive issues, particularly with salads, which cause gas and bloating. Consuming salads on consecutive days worsens these symptoms. She feels fullness and pressure, sometimes with a burning sensation in her abdomen. She has not used Tums for relief. She occasionally experiences constipation followed by diarrhea and has noted nausea without a clear cause. She is considering increasing her  fiber intake to help regulate her bowel movements.  She denies current use of Mobic  (meloxicam ) and continues to use Flonase  and omega-3 supplements.      PHYSICAL EXAM:  Blood pressure 119/77, pulse 63, temperature 97.8 F (36.6 C), height 5' 4 (1.626 m), weight 225 lb (102.1 kg), SpO2 99%. Body mass index is 38.62 kg/m.  DIAGNOSTIC DATA REVIEWED:  BMET    Component Value Date/Time   NA 141 09/22/2023 1150   NA 142 05/30/2023 0802   K 4.4 09/22/2023 1150   CL 104 09/22/2023 1150   CO2 25 09/22/2023 1150   GLUCOSE 80 09/22/2023 1150   BUN 19 09/22/2023 1150   BUN 17 05/30/2023 0802   CREATININE 1.06 09/22/2023 1150   CALCIUM  10.0 09/22/2023 1150   GFRNONAA 73.28 06/19/2009 1129   Lab Results  Component Value Date   HGBA1C 5.8 09/22/2023   HGBA1C 5.5 11/25/2008   Lab Results  Component Value Date   INSULIN  18.0 05/30/2023   Lab Results  Component Value Date   TSH 4.37 09/22/2023   CBC    Component Value Date/Time   WBC 6.9 09/22/2023 1150   RBC 4.83 09/22/2023 1150   HGB 15.1 (H) 09/22/2023 1150   HGB 15.4 01/09/2023 1307   HCT 45.9 09/22/2023 1150   HCT 45.6 01/09/2023 1307   PLT 208.0 09/22/2023 1150   PLT 213 01/09/2023 1307   MCV 95.0 09/22/2023 1150   MCV 93 01/09/2023 1307   MCH 31.4 01/09/2023 1307   MCH 30.6 10/24/2014 0150   MCHC 33.0 09/22/2023 1150   RDW 13.6 09/22/2023 1150  RDW 12.8 01/09/2023 1307   Iron Studies No results found for: IRON, TIBC, FERRITIN, IRONPCTSAT Lipid Panel     Component Value Date/Time   CHOL 213 (H) 09/22/2023 1150   CHOL 226 (H) 05/30/2023 0802   TRIG 131.0 09/22/2023 1150   HDL 53.80 09/22/2023 1150   HDL 49 05/30/2023 0802   CHOLHDL 4 09/22/2023 1150   VLDL 26.2 09/22/2023 1150   LDLCALC 133 (H) 09/22/2023 1150   LDLCALC 158 (H) 05/30/2023 0802   Hepatic Function Panel     Component Value Date/Time   PROT 7.8 09/22/2023 1150   PROT 7.4 05/30/2023 0802   ALBUMIN 4.9 09/22/2023 1150    ALBUMIN 4.9 05/30/2023 0802   AST 21 09/22/2023 1150   ALT 26 09/22/2023 1150   ALKPHOS 61 09/22/2023 1150   BILITOT 0.4 09/22/2023 1150   BILITOT 0.3 05/30/2023 0802   BILIDIR 0.1 04/11/2018 1530      Component Value Date/Time   TSH 4.37 09/22/2023 1150   Nutritional Lab Results  Component Value Date   VD25OH 46.5 05/30/2023   VD25OH 27.7 (L) 01/09/2023   VD25OH 37.11 09/21/2022     Assessment and Plan Assessment & Plan Class 3 Obesity Following the category two eating plan approximately 75% of the time. Gained 3.3 pounds since last visit two months ago. Recent back procedure has limited physical activity, but she continues to walk regularly. Reports feeling bored with current meal plan, particularly at breakfast. - Provide additional breakfast options, including a smoothie recipe and [place] toast with egg whites. - Advise to maintain breakfast calories between 250-400 and protein intake of 30 or more grams. - Schedule follow-up appointments for October and November.  Gastrointestinal symptoms (gas, bloating, intermittent constipation/diarrhea) Reports gastrointestinal symptoms including gas, bloating, and intermittent constipation/diarrhea. Symptoms aggravated by certain foods, particularly salads. Discussed role of gut bacteria and potential benefits of probiotics and fiber in managing symptoms. Not currently on any medications that would typically cause these symptoms. - Recommend probiotics and increased fiber intake, particularly from beans. - Suggest trying Tums to assess if symptoms improve, indicating gas buildup or gastritis.  Vitamin D  deficiency Requires ongoing management of vitamin D  deficiency, no SE noted - Send vitamin D  prescription to [facility] [address].    Violetta was informed of the importance of frequent follow up visits to maximize her success with intensive lifestyle modifications for her obesity and obesity related health conditions as recommended by  USPSTF and CMS guidelines   Louann Penton, MD

## 2024-03-07 ENCOUNTER — Ambulatory Visit (INDEPENDENT_AMBULATORY_CARE_PROVIDER_SITE_OTHER): Admitting: Family Medicine

## 2024-03-07 ENCOUNTER — Encounter (INDEPENDENT_AMBULATORY_CARE_PROVIDER_SITE_OTHER): Payer: Self-pay | Admitting: Family Medicine

## 2024-03-07 VITALS — BP 116/75 | HR 69 | Temp 97.4°F | Ht 64.0 in | Wt 227.0 lb

## 2024-03-07 DIAGNOSIS — Z6838 Body mass index (BMI) 38.0-38.9, adult: Secondary | ICD-10-CM | POA: Diagnosis not present

## 2024-03-07 DIAGNOSIS — R7303 Prediabetes: Secondary | ICD-10-CM | POA: Diagnosis not present

## 2024-03-07 DIAGNOSIS — Z6841 Body Mass Index (BMI) 40.0 and over, adult: Secondary | ICD-10-CM

## 2024-03-07 DIAGNOSIS — E559 Vitamin D deficiency, unspecified: Secondary | ICD-10-CM | POA: Diagnosis not present

## 2024-03-07 DIAGNOSIS — E669 Obesity, unspecified: Secondary | ICD-10-CM

## 2024-03-07 MED ORDER — METFORMIN HCL 500 MG PO TABS: 500.0000 mg | ORAL_TABLET | Freq: Every day | ORAL | 0 refills | Status: DC

## 2024-03-07 MED ORDER — VITAMIN D (ERGOCALCIFEROL) 1.25 MG (50000 UNIT) PO CAPS: 50000.0000 [IU] | ORAL_CAPSULE | ORAL | 0 refills | Status: DC

## 2024-03-07 NOTE — Progress Notes (Signed)
 Office: [phone]  /  Fax: [phone]  WEIGHT SUMMARY AND BIOMETRICS  Anthropometric Measurements Height: 5' 4 (1.626 m) Weight: 227 lb (103 kg) BMI (Calculated): 38.95 Weight at Last Visit: 225 lb Weight Lost Since Last Visit: 0 Weight Gained Since Last Visit: 2 lb Starting Weight: 245 lb Total Weight Loss (lbs): 18 lb (8.165 kg) Peak Weight: 245 lb   Body Composition  Body Fat %: 42.9 % Fat Mass (lbs): 97.4 lbs Muscle Mass (lbs): 123 lbs Total Body Water (lbs): 83.6 lbs Visceral Fat Rating : 13   Other Clinical Data Fasting: no Labs: no Today's Visit #: 16 Starting Date: 01/09/23    Chief Complaint: OBESITY    History of Present Illness Lauren Fox is a 58 year old female who presents for obesity treatment plan discussion.  She adheres to her Cat 2 diet plan 90% of the time but has gained two pounds in the last month despite walking 15 minutes three days a week. She has noticed that she has gone down a few clothing sizes and wonders if her weight gain is not due to fat.  She experiences cramps in the back of her calf muscles after extensive walking, such as during a recent visit to the [facility]. She feels bloated and notes weight fluctuations of two to three pounds, attributing this to water retention.  Her last hemoglobin A1c in May was 5.8, with a goal of 5.5. She has a history of metformin use. She is frustrated with reaching a weight plateau at 218 pounds, a weight she has reached before but struggles to go below. She finds repetitive meals boring and prefers variety in her diet.  She follows a meal plan that includes a smoothie recipe she enjoys, which sustains her through the morning. Working from home, she prefers quick breakfast options and occasionally meal preps for lunch, seeking more variety in dinner options to avoid monotony.      PHYSICAL EXAM:  Blood pressure 116/75, pulse 69, temperature (!) 97.4 F (36.3 C), height 5' 4 (1.626  m), weight 227 lb (103 kg), SpO2 100%. Body mass index is 38.96 kg/m.  DIAGNOSTIC DATA REVIEWED:  BMET    Component Value Date/Time   NA 141 09/22/2023 1150   NA 142 05/30/2023 0802   K 4.4 09/22/2023 1150   CL 104 09/22/2023 1150   CO2 25 09/22/2023 1150   GLUCOSE 80 09/22/2023 1150   BUN 19 09/22/2023 1150   BUN 17 05/30/2023 0802   CREATININE 1.06 09/22/2023 1150   CALCIUM  10.0 09/22/2023 1150   GFRNONAA 73.28 06/19/2009 1129   Lab Results  Component Value Date   HGBA1C 5.8 09/22/2023   HGBA1C 5.5 11/25/2008   Lab Results  Component Value Date   INSULIN  18.0 05/30/2023   Lab Results  Component Value Date   TSH 4.37 09/22/2023   CBC    Component Value Date/Time   WBC 6.9 09/22/2023 1150   RBC 4.83 09/22/2023 1150   HGB 15.1 (H) 09/22/2023 1150   HGB 15.4 01/09/2023 1307   HCT 45.9 09/22/2023 1150   HCT 45.6 01/09/2023 1307   PLT 208.0 09/22/2023 1150   PLT 213 01/09/2023 1307   MCV 95.0 09/22/2023 1150   MCV 93 01/09/2023 1307   MCH 31.4 01/09/2023 1307   MCH 30.6 10/24/2014 0150   MCHC 33.0 09/22/2023 1150   RDW 13.6 09/22/2023 1150   RDW 12.8 01/09/2023 1307   Iron Studies No results found for: IRON, TIBC,  FERRITIN, IRONPCTSAT Lipid Panel     Component Value Date/Time   CHOL 213 (H) 09/22/2023 1150   CHOL 226 (H) 05/30/2023 0802   TRIG 131.0 09/22/2023 1150   HDL 53.80 09/22/2023 1150   HDL 49 05/30/2023 0802   CHOLHDL 4 09/22/2023 1150   VLDL 26.2 09/22/2023 1150   LDLCALC 133 (H) 09/22/2023 1150   LDLCALC 158 (H) 05/30/2023 0802   Hepatic Function Panel     Component Value Date/Time   PROT 7.8 09/22/2023 1150   PROT 7.4 05/30/2023 0802   ALBUMIN 4.9 09/22/2023 1150   ALBUMIN 4.9 05/30/2023 0802   AST 21 09/22/2023 1150   ALT 26 09/22/2023 1150   ALKPHOS 61 09/22/2023 1150   BILITOT 0.4 09/22/2023 1150   BILITOT 0.3 05/30/2023 0802   BILIDIR 0.1 04/11/2018 1530      Component Value Date/Time   TSH 4.37 09/22/2023 1150    Nutritional Lab Results  Component Value Date   VD25OH 46.5 05/30/2023   VD25OH 27.7 (L) 01/09/2023   VD25OH 37.11 09/21/2022     Assessment and Plan Assessment & Plan Obesity Obesity with recent weight gain of 2 pounds over the last month, attributed to water and muscle rather than fat. She is actively working on [employer] management through walking and dietary changes but has reached a plateau at 218 pounds, causing frustration. Boredom with the current dietary routine is impacting weight management efforts. Metformin was discussed as an option to improve insulin  sensitivity and aid in weight management, especially for those actively working on lifestyle changes. - Initiate metformin in the morning with the first meal to improve insulin  sensitivity and aid in weight management. - Provide new dinner recipes to increase dietary variety and maintain interest in the eating plan. - Continue Cat 2 eating plan for breakfast, lunch and snacks - Ensure dinner meals contain 400-550 calories and at least 35 grams of protein. - Continue current walking routine and dietary plan with adjustments as needed.  Prediabetes Prediabetes with a recent hemoglobin A1c of 5.8%, slightly above the target of 5.5%. Excess insulin  production is contributing to increased hunger and fat storage. Metformin is recommended to improve insulin  efficiency and potentially aid in weight management. It helps insulin  work better, reducing hunger and fat storage, and is safe for long-term use if needed. - Initiate metformin to improve insulin  sensitivity and control hunger. - Monitor hemoglobin A1c levels to assess the effectiveness of metformin and dietary changes.  Vitamin D  deficiency Vitamin D  deficiency is being managed with supplementation. - Continue vitamin D  supplementation as prescribed.  Follow-Up - Schedule follow-up appointment on April 08, 2024, at 3 PM. - Schedule additional follow-up appointment on  May 13, 2024, at 4 PM.   Lauren Fox was informed of the importance of frequent follow up visits to maximize her success with intensive lifestyle modifications for her obesity and obesity related health conditions as recommended by USPSTF and CMS guidelines   Louann Penton, MD

## 2024-04-04 ENCOUNTER — Encounter (INDEPENDENT_AMBULATORY_CARE_PROVIDER_SITE_OTHER): Payer: Self-pay

## 2024-04-08 ENCOUNTER — Telehealth (INDEPENDENT_AMBULATORY_CARE_PROVIDER_SITE_OTHER): Payer: Self-pay | Admitting: Family Medicine

## 2024-04-08 ENCOUNTER — Encounter (INDEPENDENT_AMBULATORY_CARE_PROVIDER_SITE_OTHER): Payer: Self-pay | Admitting: Family Medicine

## 2024-04-08 VITALS — BP 128/68 | Ht 64.0 in | Wt 227.0 lb

## 2024-04-08 DIAGNOSIS — E669 Obesity, unspecified: Secondary | ICD-10-CM | POA: Diagnosis not present

## 2024-04-08 DIAGNOSIS — E782 Mixed hyperlipidemia: Secondary | ICD-10-CM | POA: Diagnosis not present

## 2024-04-08 DIAGNOSIS — Z6838 Body mass index (BMI) 38.0-38.9, adult: Secondary | ICD-10-CM

## 2024-04-08 DIAGNOSIS — E559 Vitamin D deficiency, unspecified: Secondary | ICD-10-CM

## 2024-04-08 DIAGNOSIS — R7303 Prediabetes: Secondary | ICD-10-CM | POA: Diagnosis not present

## 2024-04-08 MED ORDER — METFORMIN HCL 500 MG PO TABS: 500.0000 mg | ORAL_TABLET | Freq: Every day | ORAL | 0 refills | Status: DC

## 2024-04-08 MED ORDER — VITAMIN D (ERGOCALCIFEROL) 1.25 MG (50000 UNIT) PO CAPS: 50000.0000 [IU] | ORAL_CAPSULE | ORAL | 0 refills | Status: DC

## 2024-04-08 NOTE — Progress Notes (Signed)
 Office: [phone]  /  Fax: [phone]  WEIGHT SUMMARY AND BIOMETRICS  Anthropometric Measurements Height: 5' 4 (1.626 m) Weight: 227 lb (103 kg) (last ov) BMI (Calculated): 38.95 Weight Lost Since Last Visit: 0 Weight Gained Since Last Visit: 0 Starting Weight: 245 lb Total Weight Loss (lbs): 18 lb (8.165 kg) Peak Weight: 245 lb   No data recorded Other Clinical Data Fasting: no Labs: no Today's Visit #: 17 Starting Date: 01/09/23 Comments: mychart video visit    Chief Complaint: OBESITY   Virtual Visit via A/V Note  I connected with Lauren Fox on 04/08/24 at  3:00 PM EST by audiovisual telehealth and verified that I am speaking with the correct person using two identifiers.  Location: Patient: home Provider: clinic   I discussed the limitations, risks, security and privacy concerns of performing an evaluation and management service by AV telehealth and the availability of in person appointments. I also discussed with the patient that there may be a patient responsible charge related to this service. The patient expressed understanding and agreed to proceed.    History of Present Illness Lauren Fox is a 58 year old female who presents for a follow-up on her weight management and medication refills.  She maintains her weight and adheres to a category two eating plan about 50% of the time. She is not exercising regularly but reports a lack of motivation to exercise consistently, despite having access to exercise equipment at home. She experiences a lack of motivation to exercise consistently, despite having access to exercise equipment at home. She recently participated in a physically active event, which she found enjoyable.  She has prediabetes and is currently taking metformin  500 mg daily, for which she requests a refill. Her last hemoglobin A1c in May was elevated at 5.8%, and she is overdue for lab work to monitor her condition.  She is being  treated for vitamin D  deficiency with prescription ergocalciferol , having been on this treatment for 19 months, taking it for 12 weeks at a time, and requests a refill. Her vitamin D  levels were last checked in January.  Her stress levels are manageable, and her sleep is generally good, although she sometimes wakes up early. She estimates getting at least six hours of sleep per night and is working towards increasing this to seven hours.  She is currently in school for medical billing and coding, which occupies her evenings and helps her avoid nighttime snacking. She describes herself as not being a breakfast person but tries to incorporate smoothies and cereal into her morning routine.      PHYSICAL EXAM:  Blood pressure 128/68, height 5' 4 (1.626 m), weight 227 lb (103 kg). Body mass index is 38.96 kg/m.  DIAGNOSTIC DATA REVIEWED:  BMET    Component Value Date/Time   NA 141 09/22/2023 1150   NA 142 05/30/2023 0802   K 4.4 09/22/2023 1150   CL 104 09/22/2023 1150   CO2 25 09/22/2023 1150   GLUCOSE 80 09/22/2023 1150   BUN 19 09/22/2023 1150   BUN 17 05/30/2023 0802   CREATININE 1.06 09/22/2023 1150   CALCIUM  10.0 09/22/2023 1150   GFRNONAA 73.28 06/19/2009 1129   Lab Results  Component Value Date   HGBA1C 5.8 09/22/2023   HGBA1C 5.5 11/25/2008   Lab Results  Component Value Date   INSULIN  18.0 05/30/2023   Lab Results  Component Value Date   TSH 4.37 09/22/2023   CBC    Component Value Date/Time  WBC 6.9 09/22/2023 1150   RBC 4.83 09/22/2023 1150   HGB 15.1 (H) 09/22/2023 1150   HGB 15.4 01/09/2023 1307   HCT 45.9 09/22/2023 1150   HCT 45.6 01/09/2023 1307   PLT 208.0 09/22/2023 1150   PLT 213 01/09/2023 1307   MCV 95.0 09/22/2023 1150   MCV 93 01/09/2023 1307   MCH 31.4 01/09/2023 1307   MCH 30.6 10/24/2014 0150   MCHC 33.0 09/22/2023 1150   RDW 13.6 09/22/2023 1150   RDW 12.8 01/09/2023 1307   Iron Studies No results found for: IRON, TIBC,  FERRITIN, IRONPCTSAT Lipid Panel     Component Value Date/Time   CHOL 213 (H) 09/22/2023 1150   CHOL 226 (H) 05/30/2023 0802   TRIG 131.0 09/22/2023 1150   HDL 53.80 09/22/2023 1150   HDL 49 05/30/2023 0802   CHOLHDL 4 09/22/2023 1150   VLDL 26.2 09/22/2023 1150   LDLCALC 133 (H) 09/22/2023 1150   LDLCALC 158 (H) 05/30/2023 0802   Hepatic Function Panel     Component Value Date/Time   PROT 7.8 09/22/2023 1150   PROT 7.4 05/30/2023 0802   ALBUMIN 4.9 09/22/2023 1150   ALBUMIN 4.9 05/30/2023 0802   AST 21 09/22/2023 1150   ALT 26 09/22/2023 1150   ALKPHOS 61 09/22/2023 1150   BILITOT 0.4 09/22/2023 1150   BILITOT 0.3 05/30/2023 0802   BILIDIR 0.1 04/11/2018 1530      Component Value Date/Time   TSH 4.37 09/22/2023 1150   Nutritional Lab Results  Component Value Date   VD25OH 46.5 05/30/2023   VD25OH 27.7 (L) 01/09/2023   VD25OH 37.11 09/21/2022     Assessment and Plan Assessment & Plan   Obesity Managed with dietary modifications. She follows a category two eating plan 50% of the time and is not currently exercising regularly. Emphasized the importance of dietary changes over exercise for weight loss, noting that 90% of weight loss is attributed to diet. Encouraged to increase exercise gradually, starting with small steps to overcome the initial barrier of beginning exercise. - Continue category two eating plan - Encouraged gradual increase in physical activity, starting with small steps such as walking to exercise equipment  Prediabetes Managed with metformin  500 mg daily. Hemoglobin A1c was elevated at 5.8% in May. She is working on increasing activity and decreasing simple carbohydrates. Labs are overdue for monitoring. - Refilled metformin  500 mg daily - Ordered labs to check hemoglobin A1c and other relevant parameters - Continue diet, exercise and weight loss as discussed today as an important part of the treatment plan   Mixed hyperlipidemia Managed  with dietary modifications. Labs are overdue for monitoring cholesterol levels. - Ordered labs to check cholesterol levels - Continue low cholesterol diet  Vitamin D  deficiency Managed with prescription ergocalciferol  for 12 weeks. Vitamin D  level was 46.5 in January. Labs are overdue for monitoring. - Refilled ergocalciferol  - Ordered labs to check vitamin D  levels     Patients who are on anti-obesity medications are counseled on the importance of maintaining healthy lifestyle habits, including balanced nutrition, regular physical activity, and behavioral modifications,  Medication is an adjunct to, not a replacement for, lifestyle changes and that the long-term success and weight maintenance depend on continued adherence to these strategies.   Brennan was informed of the importance of frequent follow up visits to maximize her success with intensive lifestyle modifications for her obesity and obesity related health conditions as recommended by USPSTF and CMS guidelines  Louann Penton, MD

## 2024-05-08 LAB — CMP14+EGFR
ALT: 30 IU/L (ref 0–32)
AST: 23 IU/L (ref 0–40)
Albumin: 5.1 g/dL — ABNORMAL HIGH (ref 3.8–4.9)
Alkaline Phosphatase: 70 IU/L (ref 49–135)
BUN/Creatinine Ratio: 11 (ref 9–23)
BUN: 13 mg/dL (ref 6–24)
Bilirubin Total: 0.3 mg/dL (ref 0.0–1.2)
CO2: 21 mmol/L (ref 20–29)
Calcium: 10.2 mg/dL (ref 8.7–10.2)
Chloride: 104 mmol/L (ref 96–106)
Creatinine, Ser: 1.23 mg/dL — ABNORMAL HIGH (ref 0.57–1.00)
Globulin, Total: 2.6 g/dL (ref 1.5–4.5)
Glucose: 83 mg/dL (ref 70–99)
Potassium: 4.9 mmol/L (ref 3.5–5.2)
Sodium: 147 mmol/L — ABNORMAL HIGH (ref 134–144)
Total Protein: 7.7 g/dL (ref 6.0–8.5)
eGFR: 52 mL/min/1.73 — ABNORMAL LOW

## 2024-05-08 LAB — LIPID PANEL WITH LDL/HDL RATIO
Cholesterol, Total: 245 mg/dL — ABNORMAL HIGH (ref 100–199)
HDL: 54 mg/dL
LDL Chol Calc (NIH): 173 mg/dL — ABNORMAL HIGH (ref 0–99)
LDL/HDL Ratio: 3.2 ratio (ref 0.0–3.2)
Triglycerides: 104 mg/dL (ref 0–149)
VLDL Cholesterol Cal: 18 mg/dL (ref 5–40)

## 2024-05-08 LAB — HEMOGLOBIN A1C
Est. average glucose Bld gHb Est-mCnc: 117 mg/dL
Hgb A1c MFr Bld: 5.7 % — ABNORMAL HIGH (ref 4.8–5.6)

## 2024-05-08 LAB — INSULIN, RANDOM: INSULIN: 34.2 u[IU]/mL — ABNORMAL HIGH (ref 2.6–24.9)

## 2024-05-08 LAB — VITAMIN D 25 HYDROXY (VIT D DEFICIENCY, FRACTURES): Vit D, 25-Hydroxy: 73.2 ng/mL (ref 30.0–100.0)

## 2024-05-13 ENCOUNTER — Ambulatory Visit (INDEPENDENT_AMBULATORY_CARE_PROVIDER_SITE_OTHER): Payer: Self-pay | Admitting: Family Medicine

## 2024-05-13 ENCOUNTER — Encounter (INDEPENDENT_AMBULATORY_CARE_PROVIDER_SITE_OTHER): Payer: Self-pay | Admitting: Family Medicine

## 2024-05-13 VITALS — BP 135/82 | HR 72 | Temp 98.7°F | Ht 64.0 in | Wt 226.0 lb

## 2024-05-13 DIAGNOSIS — E669 Obesity, unspecified: Secondary | ICD-10-CM | POA: Diagnosis not present

## 2024-05-13 DIAGNOSIS — K580 Irritable bowel syndrome with diarrhea: Secondary | ICD-10-CM | POA: Diagnosis not present

## 2024-05-13 DIAGNOSIS — E7800 Pure hypercholesterolemia, unspecified: Secondary | ICD-10-CM

## 2024-05-13 DIAGNOSIS — R7303 Prediabetes: Secondary | ICD-10-CM

## 2024-05-13 DIAGNOSIS — E559 Vitamin D deficiency, unspecified: Secondary | ICD-10-CM

## 2024-05-13 DIAGNOSIS — Z6838 Body mass index (BMI) 38.0-38.9, adult: Secondary | ICD-10-CM | POA: Diagnosis not present

## 2024-05-13 DIAGNOSIS — E785 Hyperlipidemia, unspecified: Secondary | ICD-10-CM | POA: Diagnosis not present

## 2024-05-13 MED ORDER — ROSUVASTATIN CALCIUM 5 MG PO TABS: 5.0000 mg | ORAL_TABLET | Freq: Every day | ORAL | 0 refills | Status: AC

## 2024-05-13 MED ORDER — METFORMIN HCL 500 MG PO TABS: 500.0000 mg | ORAL_TABLET | Freq: Every day | ORAL | 0 refills | Status: AC

## 2024-05-13 MED ORDER — VITAMIN D (ERGOCALCIFEROL) 1.25 MG (50000 UNIT) PO CAPS: 50000.0000 [IU] | ORAL_CAPSULE | ORAL | 0 refills | Status: AC

## 2024-05-13 NOTE — Progress Notes (Signed)
 "  Office: [phone]  /  Fax: [phone]  WEIGHT SUMMARY AND BIOMETRICS  Anthropometric Measurements Height: 5' 4 (1.626 m) Weight: 226 lb (102.5 kg) BMI (Calculated): 38.77 Weight at Last Visit: 227lb Weight Lost Since Last Visit: 1lb Weight Gained Since Last Visit: 0lb Starting Weight: 245lb Total Weight Loss (lbs): 19 lb (8.618 kg) Peak Weight: 245lb   Body Composition  Body Fat %: 41.7 % Fat Mass (lbs): 94.6 lbs Muscle Mass (lbs): 125.6 lbs Total Body Water (lbs): 83.2 lbs Visceral Fat Rating : 13   Other Clinical Data Fasting: No Labs: No Today's Visit #: 18 Starting Date: 01/09/23    Chief Complaint: OBESITY    History of Present Illness Lauren Fox is a 58 year old female with obesity and prediabetes who presents for obesity treatment plan assessment and progress evaluation.  She is following a category two eating plan with dinner meals containing 400 to 550 calories and at least 35 grams of protein. Despite a recent flare-up of IBS, she has lost one pound over the last month. She manages her prediabetes with diet and exercise and takes metformin  500 mg daily.  She has been diagnosed with IBS, which affects her ability to follow her diet plan. She experiences alternating constipation and diarrhea, with diarrhea being more predominant. She works with a dentist, including a proofreader, a museum/gallery exhibitions officer, and a behavioral gut health doctor. She completed a month-long course of probiotics and currently uses peppermint oil, which she describes as a 'lifesaver'. Diagnostic tests for celiac disease and H. pylori were negative.  Her vitamin D  deficiency is treated with prescription ergocalciferol  50,000 IU per week. Her most recent vitamin D  level was 73.2. Her A1c was 5.7.  Her holidays went well without any major issues. She is considering meal planning as a strategy to stay on track with her diet, noting that soups and stews are  convenient options during this time of year.      PHYSICAL EXAM:  Blood pressure 135/82, pulse 72, temperature 98.7 F (37.1 C), height 5' 4 (1.626 m), weight 226 lb (102.5 kg), SpO2 98%. Body mass index is 38.79 kg/m.  DIAGNOSTIC DATA REVIEWED:  BMET    Component Value Date/Time   NA 147 (H) 05/07/2024 1029   K 4.9 05/07/2024 1029   CL 104 05/07/2024 1029   CO2 21 05/07/2024 1029   GLUCOSE 83 05/07/2024 1029   GLUCOSE 80 09/22/2023 1150   BUN 13 05/07/2024 1029   CREATININE 1.23 (H) 05/07/2024 1029   CALCIUM  10.2 05/07/2024 1029   GFRNONAA 73.28 06/19/2009 1129   Lab Results  Component Value Date   HGBA1C 5.7 (H) 05/07/2024   HGBA1C 5.5 11/25/2008   Lab Results  Component Value Date   INSULIN  34.2 (H) 05/07/2024   INSULIN  18.0 05/30/2023   Lab Results  Component Value Date   TSH 4.37 09/22/2023   CBC    Component Value Date/Time   WBC 6.9 09/22/2023 1150   RBC 4.83 09/22/2023 1150   HGB 15.1 (H) 09/22/2023 1150   HGB 15.4 01/09/2023 1307   HCT 45.9 09/22/2023 1150   HCT 45.6 01/09/2023 1307   PLT 208.0 09/22/2023 1150   PLT 213 01/09/2023 1307   MCV 95.0 09/22/2023 1150   MCV 93 01/09/2023 1307   MCH 31.4 01/09/2023 1307   MCH 30.6 10/24/2014 0150   MCHC 33.0 09/22/2023 1150   RDW 13.6 09/22/2023 1150   RDW 12.8 01/09/2023 1307  Iron Studies No results found for: IRON, TIBC, FERRITIN, IRONPCTSAT Lipid Panel     Component Value Date/Time   CHOL 245 (H) 05/07/2024 1029   TRIG 104 05/07/2024 1029   HDL 54 05/07/2024 1029   CHOLHDL 4 09/22/2023 1150   VLDL 26.2 09/22/2023 1150   LDLCALC 173 (H) 05/07/2024 1029   Hepatic Function Panel     Component Value Date/Time   PROT 7.7 05/07/2024 1029   ALBUMIN 5.1 (H) 05/07/2024 1029   AST 23 05/07/2024 1029   ALT 30 05/07/2024 1029   ALKPHOS 70 05/07/2024 1029   BILITOT 0.3 05/07/2024 1029   BILIDIR 0.1 04/11/2018 1530      Component Value Date/Time   TSH 4.37 09/22/2023 1150    Nutritional Lab Results  Component Value Date   VD25OH 73.2 05/07/2024   VD25OH 46.5 05/30/2023   VD25OH 27.7 (L) 01/09/2023     Assessment and Plan Assessment & Plan Obesity Management is ongoing with a focus on dietary modifications and weight loss. Despite recent challenges due to IBS, she has lost one pound over the last month. The category two eating plan is being followed, with dinner options adjusted to 400-550 calories and at least 35 grams of protein. Meal planning is emphasized as a key strategy for maintaining progress. - Continue category two eating plan with adjusted dinner options. - Encouraged meal planning and preparation to maintain dietary goals.  Prediabetes Managed primarily through diet, exercise, and weight loss. Metformin  500 mg daily is prescribed. Recent A1c improved from 5.8 to 5.7, indicating positive progress despite holiday challenges. Fasting glucose is at 83, which is within normal range. - Continue metformin  500 mg daily. - Maintain current diet and exercise regimen.  Vitamin D  deficiency Managed with prescription ergocalciferol  50,000 IU weekly. Recent vitamin D  level is at goal at 73.2. There is a risk of vitamin D  levels becoming too high, which could lead to nerve issues if it exceeds 100. - Adjusted ergocalciferol  to 50,000 IU every 14 days to prevent levels from exceeding 100.  Hyperlipidemia LDL levels are elevated, indicating a higher risk for cardiovascular events. Previous statin use was tolerated without issues. A low dose of Crestor is recommended to manage LDL levels, with the expectation of improved results due to lifestyle factors. - Prescribed low dose Crestor, ideally taken at bedtime. - Will recheck lipid panel in one month to assess response to treatment.  IBS - D IBS with predominant diarrhea is managed with dietary modifications and peppermint oil. Symptoms are influenced by food choices, with a focus on soft textures and lean  proteins. Peppermint oil and Ibogard have been beneficial in managing symptoms. - Continue dietary modifications focusing on soft textures and lean proteins. - Continue peppermint oil and Ibogard as needed for symptom management.      Patients who are on anti-obesity medications are counseled on the importance of maintaining healthy lifestyle habits, including balanced nutrition, regular physical activity, and behavioral modifications,  Medication is an adjunct to, not a replacement for, lifestyle changes and that the long-term success and weight maintenance depend on continued adherence to these strategies.   Rabiah was informed of the importance of frequent follow up visits to maximize her success with intensive lifestyle modifications for her obesity and obesity related health conditions as recommended by USPSTF and CMS guidelines  Louann Penton, MD   "

## 2024-06-11 ENCOUNTER — Ambulatory Visit (INDEPENDENT_AMBULATORY_CARE_PROVIDER_SITE_OTHER): Admitting: Family Medicine

## 2024-06-11 ENCOUNTER — Encounter (INDEPENDENT_AMBULATORY_CARE_PROVIDER_SITE_OTHER): Payer: Self-pay | Admitting: Family Medicine

## 2024-06-11 ENCOUNTER — Encounter (INDEPENDENT_AMBULATORY_CARE_PROVIDER_SITE_OTHER): Payer: Self-pay

## 2024-07-10 ENCOUNTER — Ambulatory Visit (INDEPENDENT_AMBULATORY_CARE_PROVIDER_SITE_OTHER): Admitting: Family Medicine
